# Patient Record
Sex: Female | Born: 1960 | Race: White | Hispanic: No | Marital: Married | Smoking: Never smoker
Health system: Southern US, Community
[De-identification: ages and names within clinical notes are randomized; demographics above are authoritative.]

## PROBLEM LIST (undated history)

## (undated) DIAGNOSIS — I10 Essential (primary) hypertension: Secondary | ICD-10-CM

## (undated) DIAGNOSIS — K760 Fatty (change of) liver, not elsewhere classified: Secondary | ICD-10-CM

## (undated) DIAGNOSIS — G43909 Migraine, unspecified, not intractable, without status migrainosus: Secondary | ICD-10-CM

## (undated) DIAGNOSIS — F4310 Post-traumatic stress disorder, unspecified: Secondary | ICD-10-CM

## (undated) DIAGNOSIS — R1310 Dysphagia, unspecified: Secondary | ICD-10-CM

## (undated) DIAGNOSIS — F419 Anxiety disorder, unspecified: Secondary | ICD-10-CM

## (undated) DIAGNOSIS — K200 Eosinophilic esophagitis: Secondary | ICD-10-CM

## (undated) DIAGNOSIS — F411 Generalized anxiety disorder: Secondary | ICD-10-CM

## (undated) DIAGNOSIS — K219 Gastro-esophageal reflux disease without esophagitis: Secondary | ICD-10-CM

## (undated) DIAGNOSIS — E739 Lactose intolerance, unspecified: Secondary | ICD-10-CM

## (undated) DIAGNOSIS — F32A Depression, unspecified: Secondary | ICD-10-CM

## (undated) DIAGNOSIS — R079 Chest pain, unspecified: Secondary | ICD-10-CM

## (undated) DIAGNOSIS — R739 Hyperglycemia, unspecified: Secondary | ICD-10-CM

## (undated) DIAGNOSIS — G4713 Recurrent hypersomnia: Secondary | ICD-10-CM

## (undated) DIAGNOSIS — T7840XA Allergy, unspecified, initial encounter: Secondary | ICD-10-CM

## (undated) DIAGNOSIS — E559 Vitamin D deficiency, unspecified: Secondary | ICD-10-CM

## (undated) DIAGNOSIS — M2550 Pain in unspecified joint: Secondary | ICD-10-CM

## (undated) DIAGNOSIS — R5383 Other fatigue: Secondary | ICD-10-CM

## (undated) DIAGNOSIS — R0602 Shortness of breath: Secondary | ICD-10-CM

## (undated) DIAGNOSIS — F329 Major depressive disorder, single episode, unspecified: Secondary | ICD-10-CM

## (undated) DIAGNOSIS — E782 Mixed hyperlipidemia: Secondary | ICD-10-CM

## (undated) HISTORY — DX: Hyperglycemia, unspecified: R73.9

## (undated) HISTORY — DX: Dysphagia, unspecified: R13.10

## (undated) HISTORY — DX: Eosinophilic esophagitis: K20.0

## (undated) HISTORY — DX: Migraine, unspecified, not intractable, without status migrainosus: G43.909

## (undated) HISTORY — DX: Generalized anxiety disorder: F41.1

## (undated) HISTORY — DX: Anxiety disorder, unspecified: F41.9

## (undated) HISTORY — DX: Post-traumatic stress disorder, unspecified: F43.10

## (undated) HISTORY — DX: Essential (primary) hypertension: I10

## (undated) HISTORY — DX: Mixed hyperlipidemia: E78.2

## (undated) HISTORY — DX: Recurrent hypersomnia: G47.13

## (undated) HISTORY — DX: Vitamin D deficiency, unspecified: E55.9

## (undated) HISTORY — DX: Pain in unspecified joint: M25.50

## (undated) HISTORY — DX: Chest pain, unspecified: R07.9

## (undated) HISTORY — DX: Fatty (change of) liver, not elsewhere classified: K76.0

## (undated) HISTORY — DX: Other fatigue: R53.83

## (undated) HISTORY — DX: Lactose intolerance, unspecified: E73.9

## (undated) HISTORY — DX: Gastro-esophageal reflux disease without esophagitis: K21.9

## (undated) HISTORY — DX: Allergy, unspecified, initial encounter: T78.40XA

## (undated) HISTORY — DX: Shortness of breath: R06.02

---
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
## 2005-01-13 ENCOUNTER — Other Ambulatory Visit: Admission: RE | Admit: 2005-01-13 | Discharge: 2005-01-13 | Payer: Self-pay | Admitting: Obstetrics and Gynecology

## 2006-07-23 ENCOUNTER — Encounter: Admission: RE | Admit: 2006-07-23 | Discharge: 2006-07-23 | Payer: Self-pay | Admitting: Gastroenterology

## 2008-05-02 ENCOUNTER — Encounter: Admission: RE | Admit: 2008-05-02 | Discharge: 2008-05-02 | Payer: Self-pay | Admitting: Internal Medicine

## 2010-01-01 LAB — HM PAP SMEAR: HM Pap smear: NORMAL

## 2010-01-01 LAB — HM MAMMOGRAPHY: HM Mammogram: NORMAL

## 2010-10-22 ENCOUNTER — Ambulatory Visit (INDEPENDENT_AMBULATORY_CARE_PROVIDER_SITE_OTHER): Payer: BC Managed Care – PPO | Admitting: Internal Medicine

## 2010-10-22 DIAGNOSIS — I10 Essential (primary) hypertension: Secondary | ICD-10-CM

## 2010-12-03 ENCOUNTER — Ambulatory Visit (INDEPENDENT_AMBULATORY_CARE_PROVIDER_SITE_OTHER): Payer: BC Managed Care – PPO | Admitting: Internal Medicine

## 2010-12-03 DIAGNOSIS — G43109 Migraine with aura, not intractable, without status migrainosus: Secondary | ICD-10-CM

## 2010-12-03 DIAGNOSIS — I10 Essential (primary) hypertension: Secondary | ICD-10-CM

## 2010-12-23 ENCOUNTER — Emergency Department (INDEPENDENT_AMBULATORY_CARE_PROVIDER_SITE_OTHER): Payer: Worker's Compensation

## 2010-12-23 ENCOUNTER — Emergency Department (HOSPITAL_BASED_OUTPATIENT_CLINIC_OR_DEPARTMENT_OTHER)
Admission: EM | Admit: 2010-12-23 | Discharge: 2010-12-23 | Disposition: A | Discharge status: Home / Self Care | Payer: Worker's Compensation | Attending: Emergency Medicine | Admitting: Emergency Medicine

## 2010-12-23 DIAGNOSIS — Y9289 Other specified places as the place of occurrence of the external cause: Secondary | ICD-10-CM | POA: Insufficient documentation

## 2010-12-23 DIAGNOSIS — S60229A Contusion of unspecified hand, initial encounter: Secondary | ICD-10-CM | POA: Insufficient documentation

## 2010-12-23 DIAGNOSIS — IMO0002 Reserved for concepts with insufficient information to code with codable children: Secondary | ICD-10-CM | POA: Insufficient documentation

## 2010-12-23 DIAGNOSIS — Y9357 Activity, non-running track and field events: Secondary | ICD-10-CM

## 2010-12-23 DIAGNOSIS — W219XXA Striking against or struck by unspecified sports equipment, initial encounter: Secondary | ICD-10-CM

## 2010-12-23 DIAGNOSIS — I10 Essential (primary) hypertension: Secondary | ICD-10-CM | POA: Insufficient documentation

## 2011-03-06 ENCOUNTER — Encounter: Payer: Self-pay | Admitting: Emergency Medicine

## 2011-04-09 ENCOUNTER — Encounter: Payer: Self-pay | Admitting: Internal Medicine

## 2011-04-09 ENCOUNTER — Ambulatory Visit (HOSPITAL_BASED_OUTPATIENT_CLINIC_OR_DEPARTMENT_OTHER)
Admission: RE | Admit: 2011-04-09 | Discharge: 2011-04-09 | Disposition: A | Discharge status: Home / Self Care | Payer: BC Managed Care – PPO | Source: Ambulatory Visit | Attending: Internal Medicine | Admitting: Internal Medicine

## 2011-04-09 ENCOUNTER — Ambulatory Visit (INDEPENDENT_AMBULATORY_CARE_PROVIDER_SITE_OTHER): Payer: BC Managed Care – PPO | Admitting: Internal Medicine

## 2011-04-09 DIAGNOSIS — I10 Essential (primary) hypertension: Secondary | ICD-10-CM

## 2011-04-09 DIAGNOSIS — R059 Cough, unspecified: Secondary | ICD-10-CM

## 2011-04-09 DIAGNOSIS — R05 Cough: Secondary | ICD-10-CM

## 2011-04-09 DIAGNOSIS — M549 Dorsalgia, unspecified: Secondary | ICD-10-CM | POA: Insufficient documentation

## 2011-04-09 DIAGNOSIS — J45909 Unspecified asthma, uncomplicated: Secondary | ICD-10-CM

## 2011-04-09 DIAGNOSIS — R209 Unspecified disturbances of skin sensation: Secondary | ICD-10-CM

## 2011-04-09 DIAGNOSIS — R208 Other disturbances of skin sensation: Secondary | ICD-10-CM

## 2011-04-09 LAB — COMPREHENSIVE METABOLIC PANEL
CO2: 26 mEq/L (ref 19–32)
Calcium: 9.8 mg/dL (ref 8.4–10.5)
Chloride: 101 mEq/L (ref 96–112)
Creat: 0.73 mg/dL (ref 0.50–1.10)
Glucose, Bld: 88 mg/dL (ref 70–99)
Sodium: 138 mEq/L (ref 135–145)
Total Bilirubin: 0.8 mg/dL (ref 0.3–1.2)
Total Protein: 6.8 g/dL (ref 6.0–8.3)

## 2011-04-09 LAB — CBC WITH DIFFERENTIAL/PLATELET
Basophils Absolute: 0 10*3/uL (ref 0.0–0.1)
HCT: 39 % (ref 36.0–46.0)
Hemoglobin: 13.5 g/dL (ref 12.0–15.0)
Lymphocytes Relative: 25 % (ref 12–46)
MCHC: 34.6 g/dL (ref 30.0–36.0)
Monocytes Absolute: 0.4 10*3/uL (ref 0.1–1.0)
Monocytes Relative: 5 % (ref 3–12)
Neutro Abs: 5.5 10*3/uL (ref 1.7–7.7)
WBC: 8.3 10*3/uL (ref 4.0–10.5)

## 2011-04-09 MED ORDER — PREDNISONE 20 MG PO TABS: ORAL_TABLET | ORAL | Status: DC

## 2011-04-09 MED ORDER — ALBUTEROL SULFATE HFA 108 (90 BASE) MCG/ACT IN AERS: 2.0000 | INHALATION_SPRAY | Freq: Four times a day (QID) | RESPIRATORY_TRACT | Status: DC | PRN

## 2011-04-09 MED ORDER — ALBUTEROL SULFATE (5 MG/ML) 0.5% IN NEBU: 2.5000 mg | INHALATION_SOLUTION | Freq: Once | RESPIRATORY_TRACT | Status: DC

## 2011-04-09 MED ORDER — AZITHROMYCIN 250 MG PO TABS: ORAL_TABLET | ORAL | Status: DC

## 2011-04-09 MED ORDER — ALBUTEROL SULFATE (2.5 MG/3ML) 0.083% IN NEBU
2.5000 mg | INHALATION_SOLUTION | Freq: Once | RESPIRATORY_TRACT | Status: AC
  Administered 2011-04-09: 2.5 mg via RESPIRATORY_TRACT

## 2011-04-09 NOTE — Patient Instructions (Signed)
Take albuterol 2 puffs 3-4 times daily.  Take antibiotic as prescribed  See me in approxiamately 1 week  OTC cough med of choice

## 2011-04-09 NOTE — Progress Notes (Signed)
Subjective:    Patient ID: Megan May, female    DOB: [birth year], 50 y.o.   MRN: [id number]  HPI  Megan May is here for acute visit.  She has had 3 weeks of cough productive at times of greenish mucous.  She has R sided constant pleurtitic pain with some wheezing.  She has had a history of asthma in the past and reports using her albuterol 2 times in the last 3 weeks.  Dayquil seems to control the cough  She also reports she has not been taking the Propranolol I ordered in May.  She reports she was placed on Topamax for migraine prevention and that has helped but she has since stopped her Topamax.  She has no symptoms from her elevated BP  She also reports occasional shooting "electrical" pain near elbow and feet.  She is on Cymbalta and recently changed the dose.  She was worried her glucose may be high.  No visual, speech, changes or muscle weakness.  Allergies  Allergen Reactions  . Penicillins Swelling and Rash   Past Medical History  Diagnosis Date  . Hypertension   . Migraine   . Anxiety   . Eosinophilic esophagitis   . Asthma    Past Surgical History  Procedure Date  . Tonsillectomy and adenoidectomy 1969  . Fracture surgery 1973    R arm  . Cesarean section 2004   History   Social History  . Marital Status: Married    Spouse Name: N/A    Number of Children: N/A  . Years of Education: N/A   Occupational History  . Not on file.   Social History Main Topics  . Smoking status: Never Smoker   . Smokeless tobacco: Not on file  . Alcohol Use: No  . Drug Use: No  . Sexually Active: Yes    Birth Control/ Protection: Surgical   Other Topics Concern  . Not on file   Social History Narrative  . No narrative on file   Family History  Problem Relation Age of Onset  . Hypertension Father   . Heart disease Father   . Diabetes Father    There is no problem list on file for this patient.  Current Outpatient Prescriptions on File Prior to Visit    Medication Sig Dispense Refill  . DULoxetine (CYMBALTA) 30 MG capsule Take 90 mg by mouth daily.        Marland Kitchen eletriptan (RELPAX) 40 MG tablet One tablet by mouth as needed for migraine headache.  If the headache improves and then returns, dose may be repeated after 2 hours have elapsed since first dose (do not exceed 80 mg per day). may repeat in 2 hours if necessary       . propranolol (INDERAL) 20 MG tablet Take 20 mg by mouth 3 (three) times daily.           Review of Systems    See HPI Objective:   Physical Exam Physical Exam  Constitutional: She is oriented to person, place, and time. She appears well-developed and well-nourished. She is cooperative.  HENT:  Head: Normocephalic and atraumatic.  Right Ear: A middle ear effusion is present.  Left Ear: A middle ear effusion is present.  Nose: Mucosal edema present. Right sinus exhibits maxillary sinus tenderness. Left sinus exhibits maxillary sinus tenderness.  Mouth/Throat: Posterior oropharyngeal erythema present.  Serous effusion bilaterally  Eyes: Conjunctivae and EOM are normal. Pupils are equal, round, and reactive to light.  Neck: Neck supple.  Carotid bruit is not present. No mass present.  Cardiovascular: Regular rhythm, normal heart sounds, intact distal pulses and normal pulses. Exam reveals no gallop and no friction rub.  No murmur heard.  Pulmonary/Chest: She has wheezes and rhonchii in the R base.  End expiratory wheezing on L .  Neurological: She is alert and oriented to person, place, and time.  Skin: Skin is warm and dry. No abrasion, no bruising, no ecchymosis and no rash noted. No cyanosis. Nails show no clubbing.  Psychiatric: She has a normal mood and affect. Her speech is normal and behavior is normal.        Assessment & Plan:  1)  Asthma with cough:  Will check CXR and CBC  today .  Clinical exam she may have consolidataion on the Right 2)  HTN:  Pt self discontinued her med.  She is hesitant to take  anything.  I will see her in 1 week and recheck BP at that time. 3)  Dysesthesias elbow and both  feet:  May be Cymbalta but if persists will need neurology referral. 4)  Migraine H/A  improved

## 2011-04-13 ENCOUNTER — Encounter: Payer: Self-pay | Admitting: Emergency Medicine

## 2011-04-16 ENCOUNTER — Ambulatory Visit: Payer: Self-pay | Admitting: Internal Medicine

## 2011-04-22 ENCOUNTER — Ambulatory Visit: Payer: Self-pay | Admitting: Internal Medicine

## 2011-06-29 ENCOUNTER — Encounter: Payer: Self-pay | Admitting: Internal Medicine

## 2011-06-29 ENCOUNTER — Ambulatory Visit (INDEPENDENT_AMBULATORY_CARE_PROVIDER_SITE_OTHER): Payer: BC Managed Care – PPO | Admitting: Internal Medicine

## 2011-06-29 DIAGNOSIS — R760 Raised antibody titer: Secondary | ICD-10-CM | POA: Insufficient documentation

## 2011-06-29 DIAGNOSIS — F419 Anxiety disorder, unspecified: Secondary | ICD-10-CM | POA: Insufficient documentation

## 2011-06-29 DIAGNOSIS — I10 Essential (primary) hypertension: Secondary | ICD-10-CM | POA: Insufficient documentation

## 2011-06-29 DIAGNOSIS — R894 Abnormal immunological findings in specimens from other organs, systems and tissues: Secondary | ICD-10-CM

## 2011-06-29 DIAGNOSIS — G43909 Migraine, unspecified, not intractable, without status migrainosus: Secondary | ICD-10-CM

## 2011-06-29 DIAGNOSIS — K200 Eosinophilic esophagitis: Secondary | ICD-10-CM

## 2011-06-29 DIAGNOSIS — F411 Generalized anxiety disorder: Secondary | ICD-10-CM

## 2011-06-29 NOTE — Progress Notes (Signed)
Subjective:    Patient ID: Megan May, female    DOB: [birth year], 50 y.o.   MRN: [id number]  HPI Megan May is here for an acute visit.  She states she had a horrible headache over the week-end.  She was not sure if it was due to migraine or her BP.  She re-started her topamax over week-end  And has been using RElpax.   2 days ago started her Propranolol 20 mg.  I originally prescribed her Propranolol onm 12/03/2010 and she reports she never took it for reasons that are not clear to me today.  She reports her husband went out to fill the RX and she began taking it on Saturday.  Today headache better. Her outpt. BP's have ranged from 187-205 systolic over the weekend.  No chest pain, SOB, or LE edem  Allergies  Allergen Reactions  . Penicillins Swelling and Rash   Past Medical History  Diagnosis Date  . Hypertension   . Migraine   . Anxiety   . Eosinophilic esophagitis   . Asthma    Past Surgical History  Procedure Date  . Tonsillectomy and adenoidectomy 1969  . Fracture surgery 1973    R arm  . Cesarean section 2004   History   Social History  . Marital Status: Married    Spouse Name: N/A    Number of Children: N/A  . Years of Education: N/A   Occupational History  . Not on file.   Social History Main Topics  . Smoking status: Never Smoker   . Smokeless tobacco: Not on file  . Alcohol Use: No  . Drug Use: No  . Sexually Active: Yes    Birth Control/ Protection: Surgical   Other Topics Concern  . Not on file   Social History Narrative  . No narrative on file   Family History  Problem Relation Age of Onset  . Hypertension Father   . Heart disease Father   . Diabetes Father    Patient Active Problem List  Diagnoses  . Essential hypertension, benign  . Migraines  . Anxiety  . Eosinophilic esophagitis  . Lupus anticoagulant positive   Current Outpatient Prescriptions on File Prior to Visit  Medication Sig Dispense Refill  . albuterol (PROVENTIL  HFA;VENTOLIN HFA) 108 (90 BASE) MCG/ACT inhaler Inhale 2 puffs into the lungs every 6 (six) hours as needed.        Marland Kitchen albuterol (VENTOLIN HFA) 108 (90 BASE) MCG/ACT inhaler Inhale 2 puffs into the lungs every 6 (six) hours as needed for wheezing.  1 Inhaler  0  . DULoxetine (CYMBALTA) 30 MG capsule Take 60 mg by mouth daily.       Marland Kitchen eletriptan (RELPAX) 40 MG tablet One tablet by mouth as needed for migraine headache.  If the headache improves and then returns, dose may be repeated after 2 hours have elapsed since first dose (do not exceed 80 mg per day). may repeat in 2 hours if necessary       . propranolol (INDERAL) 20 MG tablet Take 20 mg by mouth 3 (three) times daily.             Review of Systems    see HPI Objective:   Physical Exam Physical Exam  Nursing note and vitals reviewed.  Constitutional: She is oriented to person, place, and time. She appears well-developed and well-nourished.  HENT:  Head: Normocephalic and atraumatic.  Cardiovascular: Normal rate and regular rhythm. Exam reveals no gallop and no  friction rub.  No murmur heard.  Pulmonary/Chest: Breath sounds normal. She has no wheezes. She has no rales.  Neurological: She is alert and oriented to person, place, and time.  Skin: Skin is warm and dry.  Psychiatric: She has a normal mood and affect. Her behavior is normal. Neuro:  Nonfocal       Assessment & Plan:  1)  Uncontrolled HTN:  I told pt she was playing with fire by not taking her BP med.  She is to take propranolol 20 mg daily and see me in three weeks 2)  Migraine headaches:  Continue to see Dr. Catalina Lunger.  Uncontrolled HTN contributing a signficant part to headaches  See me in 3 weeks.  She neefds CPe

## 2011-06-29 NOTE — Patient Instructions (Signed)
TAke BP med everday  See me in 3 weeks

## 2011-07-15 ENCOUNTER — Ambulatory Visit (INDEPENDENT_AMBULATORY_CARE_PROVIDER_SITE_OTHER): Payer: BC Managed Care – PPO | Admitting: Internal Medicine

## 2011-07-15 ENCOUNTER — Encounter: Payer: Self-pay | Admitting: Internal Medicine

## 2011-07-15 DIAGNOSIS — G43909 Migraine, unspecified, not intractable, without status migrainosus: Secondary | ICD-10-CM

## 2011-07-15 DIAGNOSIS — I10 Essential (primary) hypertension: Secondary | ICD-10-CM

## 2011-07-15 DIAGNOSIS — F419 Anxiety disorder, unspecified: Secondary | ICD-10-CM

## 2011-07-15 DIAGNOSIS — F411 Generalized anxiety disorder: Secondary | ICD-10-CM

## 2011-07-15 NOTE — Progress Notes (Signed)
Subjective:    Patient ID: Megan May, female    DOB: May 05, 1961, 50 y.o.   MRN: [id number]  HPI  Megan May is here for follow up.  She states headaches are less frequent but she does not like to Topamax that Dr. Catalina Lunger prescribes for her.  She has bveen having mood irritability on it.   Tolerating Propranolol well  She does tend to self medicate with her meds.  Allergies  Allergen Reactions  . Penicillins Swelling and Rash   Past Medical History  Diagnosis Date  . Hypertension   . Migraine   . Anxiety   . Eosinophilic esophagitis   . Asthma    Past Surgical History  Procedure Date  . Tonsillectomy and adenoidectomy 1969  . Fracture surgery 1973    R arm  . Cesarean section 2004   History   Social History  . Marital Status: Married    Spouse Name: N/A    Number of Children: N/A  . Years of Education: N/A   Occupational History  . Not on file.   Social History Main Topics  . Smoking status: Never Smoker   . Smokeless tobacco: Not on file  . Alcohol Use: No  . Drug Use: No  . Sexually Active: Yes    Birth Control/ Protection: Surgical   Other Topics Concern  . Not on file   Social History Narrative  . No narrative on file   Family History  Problem Relation Age of Onset  . Hypertension Father   . Heart disease Father   . Diabetes Father    Patient Active Problem List  Diagnoses  . Essential hypertension, benign  . Migraines  . Anxiety  . Eosinophilic esophagitis  . Lupus anticoagulant positive   Current Outpatient Prescriptions on File Prior to Visit  Medication Sig Dispense Refill  . albuterol (PROVENTIL HFA;VENTOLIN HFA) 108 (90 BASE) MCG/ACT inhaler Inhale 2 puffs into the lungs every 6 (six) hours as needed.        Marland Kitchen albuterol (VENTOLIN HFA) 108 (90 BASE) MCG/ACT inhaler Inhale 2 puffs into the lungs every 6 (six) hours as needed for wheezing.  1 Inhaler  0  . DULoxetine (CYMBALTA) 30 MG capsule Take 60 mg by mouth daily.       Marland Kitchen  eletriptan (RELPAX) 40 MG tablet One tablet by mouth as needed for migraine headache.  If the headache improves and then returns, dose may be repeated after 2 hours have elapsed since first dose (do not exceed 80 mg per day). may repeat in 2 hours if necessary       . propranolol (INDERAL) 20 MG tablet Take 20 mg by mouth 3 (three) times daily.        Marland Kitchen topiramate (TOPAMAX) 25 MG capsule Take 50 mg by mouth daily.          Review of Systems    see HPI Objective:   Physical Exam Physical Exam  Nursing note and vitals reviewed.  Constitutional: She is oriented to person, place, and time. She appears well-developed and well-nourished.  HENT:  Head: Normocephalic and atraumatic.  Cardiovascular: Normal rate and regular rhythm. Exam reveals no gallop and no friction rub.  No murmur heard.  Pulmonary/Chest: Breath sounds normal. She has no wheezes. She has no rales.  Neurological: She is alert and oriented to person, place, and time.  Skin: Skin is warm and dry.  Psychiatric: She has a normal mood and affect. Her behavior is normal.  Assessment & Plan:  1)  HTN:  WEll conrtolled on current dose of Propranolol.  I encouraged pt to stay on this dose and take daily 2)  Mood irritability.  To see her psychiatarist 3)

## 2011-07-15 NOTE — Patient Instructions (Signed)
Take BP med daily

## 2011-07-23 ENCOUNTER — Telehealth: Payer: Self-pay | Admitting: Emergency Medicine

## 2011-07-23 DIAGNOSIS — I10 Essential (primary) hypertension: Secondary | ICD-10-CM

## 2011-07-23 NOTE — Telephone Encounter (Signed)
Megan May called with several medication changes and a question related to a requested medication change by her psychiatry team.  She states that Megan May at Dr. Loralie Champagne office is changing her Cymbalta to Lexapro.  However, she would like DDS to change her propanolol to another BP agent because they feel like the Lexapro increases the effect of the propanolol increasing Megan May's fatigue causing her to fall asleep during the day.  They also believe the propanolol is increasing Megan May's depression.  Megan May's migraine physician is also changing her Topamax to Zonegran, so Megan May wanted to be sure that her new BP medication would be compatible with both Lexapro and Zonegran.  Aware DDS OOO until 12/27.  She will leave medications as they are currently and make no changes until DDS back in office and can make a recommendation regarding blood pressure medication.

## 2011-07-31 MED ORDER — DILTIAZEM HCL ER COATED BEADS 300 MG PO CP24: 300.0000 mg | ORAL_CAPSULE | Freq: Every day | ORAL | Status: DC

## 2011-07-31 NOTE — Telephone Encounter (Signed)
Spoke with [employer].  She is aware of how to taper off propanolol and start diltiazem.  She is also aware she needs to make follow up appt in 1 month, but states she will call back when she gets back in town to make appt.  Aware medication escribed to [facility]

## 2011-07-31 NOTE — Telephone Encounter (Signed)
Will change her BP med to cardizem.  Tell her to taper off slowly the propranolol - take a tablet every other day for 1 week then can stop the propranolol.  See Cardizem order  Below and can begin the Cardizem the morning after the last dose of Propranolol. Cardizem should not interfere with other meds or cause depression.  Give her an appt to see me in one month in office. 30 min appt.

## 2011-10-21 ENCOUNTER — Encounter: Payer: Self-pay | Admitting: Internal Medicine

## 2011-10-21 ENCOUNTER — Ambulatory Visit (INDEPENDENT_AMBULATORY_CARE_PROVIDER_SITE_OTHER): Payer: BC Managed Care – PPO | Admitting: Internal Medicine

## 2011-10-21 VITALS — BP 134/80 | HR 64 | Temp 97.9°F | Ht 67.0 in | Wt 196.0 lb

## 2011-10-21 DIAGNOSIS — I10 Essential (primary) hypertension: Secondary | ICD-10-CM

## 2011-10-21 MED ORDER — DILTIAZEM HCL ER COATED BEADS 300 MG PO CP24: 300.0000 mg | ORAL_CAPSULE | Freq: Every day | ORAL | Status: DC

## 2011-10-21 NOTE — Progress Notes (Signed)
  Subjective:    Patient ID: Megan May, female    DOB: [birth year], 51 y.o.   MRN: [id number]  HPI Megan May is here for follow up.  She has run out of her BP med one week ago.   She states  "I hurt all over"  Arms, legs, "everywhere"  She is wondering if she has fibromyalgia  Also decreased hearing in her R ear.    Allergies  Allergen Reactions  . Penicillins Swelling and Rash   Past Medical History  Diagnosis Date  . Hypertension   . Migraine   . Anxiety   . Eosinophilic esophagitis   . Asthma    Past Surgical History  Procedure Date  . Tonsillectomy and adenoidectomy 1969  . Fracture surgery 1973    R arm  . Cesarean section 2004   History   Social History  . Marital Status: Married    Spouse Name: N/A    Number of Children: N/A  . Years of Education: N/A   Occupational History  . Not on file.   Social History Main Topics  . Smoking status: Never Smoker   . Smokeless tobacco: Not on file  . Alcohol Use: No  . Drug Use: No  . Sexually Active: Yes    Birth Control/ Protection: Surgical   Other Topics Concern  . Not on file   Social History Narrative  . No narrative on file   Family History  Problem Relation Age of Onset  . Hypertension Father   . Heart disease Father   . Diabetes Father    Patient Active Problem List  Diagnoses  . Essential hypertension, benign  . Migraines  . Anxiety  . Eosinophilic esophagitis  . Lupus anticoagulant positive   Current Outpatient Prescriptions on File Prior to Visit  Medication Sig Dispense Refill  . albuterol (VENTOLIN HFA) 108 (90 BASE) MCG/ACT inhaler Inhale 2 puffs into the lungs every 6 (six) hours as needed for wheezing.  1 Inhaler  0  . eletriptan (RELPAX) 40 MG tablet One tablet by mouth as needed for migraine headache.  If the headache improves and then returns, dose may be repeated after 2 hours have elapsed since first dose (do not exceed 80 mg per day). may repeat in 2 hours if necessary             Review of Systems See HPI    Objective:   Physical Exam Physical Exam  Nursing note and vitals reviewed.  Constitutional: She is oriented to person, place, and time. She appears well-developed and well-nourished.  HENT:  Head: Normocephalic and atraumatic.  TM's  L canal clear  No effusion  R ear canal large amount of cerumen.   Cardiovascular: Normal rate and regular rhythm. Exam reveals no gallop and no friction rub.  No murmur heard.  Pulmonary/Chest: Breath sounds normal. She has no wheezes. She has no rales.  Neurological: She is alert and oriented to person, place, and time.  Skin: Skin is warm and dry.  Psychiatric: She has a normal mood and affect. Her behavior is normal.         Assessment & Plan:  1)  HTN  Will refill Cardizem 2)  Partial cerumen blockage R ear.  OK for Debrox  She is to notify us if hearing not improved.    Advised pt to schedule CPE  She states  "I avoid them"  Offered Rheumatology referral for suspected FM but she declines at this time

## 2011-10-21 NOTE — Patient Instructions (Signed)
Labs will be mailed to you  Call if not better

## 2011-10-22 ENCOUNTER — Encounter: Payer: Self-pay | Admitting: Emergency Medicine

## 2011-10-22 LAB — COMPREHENSIVE METABOLIC PANEL
Albumin: 4.5 g/dL (ref 3.5–5.2)
Alkaline Phosphatase: 57 U/L (ref 39–117)
BUN: 18 mg/dL (ref 6–23)
CO2: 25 mEq/L (ref 19–32)
Calcium: 9.7 mg/dL (ref 8.4–10.5)
Chloride: 104 mEq/L (ref 96–112)
Creat: 0.9 mg/dL (ref 0.50–1.10)
Glucose, Bld: 81 mg/dL (ref 70–99)
Potassium: 4 mEq/L (ref 3.5–5.3)

## 2011-10-22 LAB — CBC WITH DIFFERENTIAL/PLATELET
Basophils Absolute: 0 10*3/uL (ref 0.0–0.1)
Basophils Relative: 0 % (ref 0–1)
Eosinophils Absolute: 0.2 10*3/uL (ref 0.0–0.7)
Eosinophils Relative: 3 % (ref 0–5)
HCT: 39 % (ref 36.0–46.0)
Hemoglobin: 13.1 g/dL (ref 12.0–15.0)
MCV: 85.3 fL (ref 78.0–100.0)
RBC: 4.57 MIL/uL (ref 3.87–5.11)
WBC: 6.7 10*3/uL (ref 4.0–10.5)

## 2011-10-22 LAB — LIPID PANEL
HDL: 37 mg/dL — ABNORMAL LOW (ref >39)
LDL Cholesterol: 84 mg/dL (ref 0–99)
Total CHOL/HDL Ratio: 3.8 Ratio
VLDL: 19 mg/dL (ref 0–40)

## 2012-06-22 ENCOUNTER — Other Ambulatory Visit: Payer: Self-pay | Admitting: *Deleted

## 2012-06-22 DIAGNOSIS — I10 Essential (primary) hypertension: Secondary | ICD-10-CM

## 2012-06-22 NOTE — Telephone Encounter (Signed)
Needs refill

## 2012-06-23 MED ORDER — DILTIAZEM HCL ER COATED BEADS 300 MG PO CP24: 300.0000 mg | ORAL_CAPSULE | Freq: Every day | ORAL | Status: DC

## 2012-06-27 ENCOUNTER — Other Ambulatory Visit: Payer: Self-pay | Admitting: *Deleted

## 2012-06-27 DIAGNOSIS — I10 Essential (primary) hypertension: Secondary | ICD-10-CM

## 2012-06-27 MED ORDER — ZONISAMIDE 25 MG PO CAPS: 25.0000 mg | ORAL_CAPSULE | Freq: Every day | ORAL | Status: DC

## 2012-06-27 MED ORDER — DILTIAZEM HCL ER COATED BEADS 300 MG PO CP24: 300.0000 mg | ORAL_CAPSULE | Freq: Every day | ORAL | Status: DC

## 2012-06-27 NOTE — Telephone Encounter (Signed)
Confirmed pt pharmacy called rx in to [facility] pt also requesting a refill on zonisamide

## 2012-09-20 ENCOUNTER — Other Ambulatory Visit (INDEPENDENT_AMBULATORY_CARE_PROVIDER_SITE_OTHER): Payer: BC Managed Care – PPO

## 2012-09-20 DIAGNOSIS — R300 Dysuria: Secondary | ICD-10-CM

## 2012-09-20 LAB — POCT URINALYSIS DIPSTICK
Ketones, UA: NEGATIVE
Leukocytes, UA: NEGATIVE
Protein, UA: NEGATIVE
Urobilinogen, UA: NEGATIVE

## 2012-10-13 ENCOUNTER — Other Ambulatory Visit: Payer: Self-pay | Admitting: Internal Medicine

## 2012-10-13 ENCOUNTER — Ambulatory Visit (INDEPENDENT_AMBULATORY_CARE_PROVIDER_SITE_OTHER): Payer: BC Managed Care – PPO | Admitting: Internal Medicine

## 2012-10-13 ENCOUNTER — Encounter: Payer: Self-pay | Admitting: Internal Medicine

## 2012-10-13 VITALS — BP 138/86 | HR 65 | Temp 99.0°F | Resp 18 | Ht 67.0 in | Wt 208.0 lb

## 2012-10-13 DIAGNOSIS — N6310 Unspecified lump in the right breast, unspecified quadrant: Secondary | ICD-10-CM

## 2012-10-13 DIAGNOSIS — N63 Unspecified lump in unspecified breast: Secondary | ICD-10-CM

## 2012-10-13 DIAGNOSIS — N644 Mastodynia: Secondary | ICD-10-CM

## 2012-10-13 NOTE — Progress Notes (Signed)
Subjective:    Patient ID: Megan May, female    DOB: 06-01-1961, 52 y.o.   MRN: [id number]  HPI  Megan May is here for acute visit.  She is concerned about pain and a mass she felt in her R breast.  No FH of breast cancer to her knowledge.  She felt mass in R breast near nipple .  States she cannot feel it today  No nipple discharge    NO redness or rash  Allergies  Allergen Reactions  . Penicillins Swelling and Rash   Past Medical History  Diagnosis Date  . Hypertension   . Migraine   . Anxiety   . Eosinophilic esophagitis   . Asthma    Past Surgical History  Procedure Laterality Date  . Tonsillectomy and adenoidectomy  1969  . Fracture surgery  1973    R arm  . Cesarean section  2004   History   Social History  . Marital Status: Married    Spouse Name: N/A    Number of Children: N/A  . Years of Education: N/A   Occupational History  . Not on file.   Social History Main Topics  . Smoking status: Never Smoker   . Smokeless tobacco: Not on file  . Alcohol Use: No  . Drug Use: No  . Sexually Active: Yes    Birth Control/ Protection: Surgical   Other Topics Concern  . Not on file   Social History Narrative  . No narrative on file   Family History  Problem Relation Age of Onset  . Hypertension Father   . Heart disease Father   . Diabetes Father    Patient Active Problem List  Diagnosis  . Essential hypertension, benign  . Migraines  . Anxiety  . Eosinophilic esophagitis  . Lupus anticoagulant positive  . Mastodynia  . Mass of right breast   Current Outpatient Prescriptions on File Prior to Visit  Medication Sig Dispense Refill  . diltiazem (CARDIZEM CD) 300 MG 24 hr capsule Take 1 capsule (300 mg total) by mouth daily.  90 capsule  3  . eletriptan (RELPAX) 40 MG tablet One tablet by mouth as needed for migraine headache.  If the headache improves and then returns, dose may be repeated after 2 hours have elapsed since first dose (do not  exceed 80 mg per day). may repeat in 2 hours if necessary       . escitalopram (LEXAPRO) 10 MG tablet Take 1 tablet by mouth Daily.      Marland Kitchen zonisamide (ZONEGRAN) 25 MG capsule Take 1 capsule (25 mg total) by mouth daily.  90 capsule  1  . albuterol (VENTOLIN HFA) 108 (90 BASE) MCG/ACT inhaler Inhale 2 puffs into the lungs every 6 (six) hours as needed for wheezing.  1 Inhaler  0  . [DISCONTINUED] DULoxetine (CYMBALTA) 30 MG capsule Take 60 mg by mouth daily.       . [DISCONTINUED] topiramate (TOPAMAX) 25 MG capsule Take 75 mg by mouth daily.        No current facility-administered medications on file prior to visit.       Review of Systems See HPI    Objective:   Physical Exam Physical Exam  Nursing note and vitals reviewed.  Constitutional: She is oriented to person, place, and time. She appears well-developed and well-nourished.  HENT:  Head: Normocephalic and atraumatic.  Cardiovascular: Normal rate and regular rhythm. Exam reveals no gallop and no friction rub.  No murmur heard.  Pulmonary/Chest: Breath sounds normal. She has no wheezes. She has no rales. Breast  L breast no discrete mass no nipple discharge no axillary adenopathy bilaterlaly.  R breast  No nipple discharge. No redness  She has slight thickening 7 oclock near her nipple    Neurological: She is alert and oriented to person, place, and time.  Skin: Skin is warm and dry.  Psychiatric: She has a normal mood and affect. Her behavior is normal.             Assessment & Plan:  Mastodynia  R breast mass will get diagnostic mm

## 2012-10-13 NOTE — Patient Instructions (Addendum)
Call office after mm done

## 2012-10-25 ENCOUNTER — Ambulatory Visit
Admission: RE | Admit: 2012-10-25 | Discharge: 2012-10-25 | Disposition: A | Discharge status: Home / Self Care | Payer: BC Managed Care – PPO | Source: Ambulatory Visit | Attending: Internal Medicine | Admitting: Internal Medicine

## 2012-10-25 DIAGNOSIS — N6310 Unspecified lump in the right breast, unspecified quadrant: Secondary | ICD-10-CM

## 2012-10-27 ENCOUNTER — Telehealth: Payer: Self-pay | Admitting: *Deleted

## 2012-10-27 NOTE — Telephone Encounter (Signed)
No answer will attempt to reach pt again

## 2012-10-27 NOTE — Telephone Encounter (Signed)
Message copied by Mathews Robinsons on Thu Oct 27, 2012  4:33 PM ------      Message from: Raechel Chute D      Created: Thu Oct 27, 2012  8:13 AM       Karen Kitchens            Call pt and let her know that I saw her mammogram and ultrasound results and no mass was detected.            She can see me in office as needed or if any new issue developes when she checks her breasts ------

## 2012-10-28 ENCOUNTER — Telehealth: Payer: Self-pay | Admitting: *Deleted

## 2012-10-28 NOTE — Telephone Encounter (Signed)
Message copied by Mathews Robinsons on Fri Oct 28, 2012 11:07 AM ------      Message from: Raechel Chute D      Created: Thu Oct 27, 2012  8:13 AM       Karen Kitchens            Call pt and let her know that I saw her mammogram and ultrasound results and no mass was detected.            She can see me in office as needed or if any new issue developes when she checks her breasts ------

## 2012-10-28 NOTE — Telephone Encounter (Signed)
LVM

## 2013-04-11 ENCOUNTER — Other Ambulatory Visit: Payer: Self-pay | Admitting: *Deleted

## 2013-04-11 NOTE — Telephone Encounter (Signed)
Notified pharmacy that pt is to get this filled by her neurologist Dr Sharene Skeans

## 2013-04-11 NOTE — Telephone Encounter (Signed)
Refill request

## 2013-04-13 ENCOUNTER — Telehealth: Payer: Self-pay | Admitting: *Deleted

## 2013-04-13 NOTE — Telephone Encounter (Signed)
Pt states that she needs a refill of albuterol and zonisamide. See note in comments section

## 2013-04-14 ENCOUNTER — Telehealth: Payer: Self-pay | Admitting: *Deleted

## 2013-04-14 DIAGNOSIS — R05 Cough: Secondary | ICD-10-CM

## 2013-04-14 DIAGNOSIS — R059 Cough, unspecified: Secondary | ICD-10-CM

## 2013-04-14 DIAGNOSIS — R062 Wheezing: Secondary | ICD-10-CM

## 2013-04-14 MED ORDER — ALBUTEROL SULFATE HFA 108 (90 BASE) MCG/ACT IN AERS: 2.0000 | INHALATION_SPRAY | Freq: Three times a day (TID) | RESPIRATORY_TRACT | Status: DC

## 2013-04-14 MED ORDER — ZONISAMIDE 25 MG PO CAPS: 25.0000 mg | ORAL_CAPSULE | Freq: Every day | ORAL | Status: DC

## 2013-04-14 NOTE — Telephone Encounter (Signed)
Refilled zonisamide per Dr Constance Goltz request

## 2013-04-14 NOTE — Telephone Encounter (Signed)
LVM message to return call  

## 2013-04-16 NOTE — Telephone Encounter (Signed)
Megan May  Call pt ..  I will refill her albuterol but I do not give zonisamide for migraines.  It was given to her by her neurologist but Dr. Catalina Lunger has moved to [facility].   She can see me in office for other options to treat her headaches or I can refer her to a new neurologist.    Let me now  I e-scribed her albuterol.

## 2013-04-18 ENCOUNTER — Telehealth: Payer: Self-pay | Admitting: *Deleted

## 2013-04-18 NOTE — Telephone Encounter (Signed)
Megan May called and left a message that you can leave a detailed message for her on her voicemail.  She is unable to answer phone during normal business hours.

## 2013-04-18 NOTE — Telephone Encounter (Signed)
Notified pt of medication refill

## 2013-04-25 ENCOUNTER — Emergency Department (HOSPITAL_COMMUNITY): Payer: BC Managed Care – PPO

## 2013-04-25 ENCOUNTER — Emergency Department (HOSPITAL_COMMUNITY)
Admission: EM | Admit: 2013-04-25 | Discharge: 2013-04-25 | Disposition: A | Discharge status: Home / Self Care | Payer: BC Managed Care – PPO | Attending: Emergency Medicine | Admitting: Emergency Medicine

## 2013-04-25 ENCOUNTER — Encounter (HOSPITAL_COMMUNITY): Payer: Self-pay

## 2013-04-25 DIAGNOSIS — R4182 Altered mental status, unspecified: Secondary | ICD-10-CM | POA: Insufficient documentation

## 2013-04-25 DIAGNOSIS — E876 Hypokalemia: Secondary | ICD-10-CM

## 2013-04-25 DIAGNOSIS — Z3202 Encounter for pregnancy test, result negative: Secondary | ICD-10-CM | POA: Insufficient documentation

## 2013-04-25 DIAGNOSIS — Z79899 Other long term (current) drug therapy: Secondary | ICD-10-CM | POA: Insufficient documentation

## 2013-04-25 DIAGNOSIS — F411 Generalized anxiety disorder: Secondary | ICD-10-CM | POA: Insufficient documentation

## 2013-04-25 DIAGNOSIS — R209 Unspecified disturbances of skin sensation: Secondary | ICD-10-CM | POA: Insufficient documentation

## 2013-04-25 DIAGNOSIS — Z8719 Personal history of other diseases of the digestive system: Secondary | ICD-10-CM | POA: Insufficient documentation

## 2013-04-25 DIAGNOSIS — Z880 Allergy status to penicillin: Secondary | ICD-10-CM | POA: Insufficient documentation

## 2013-04-25 DIAGNOSIS — J45909 Unspecified asthma, uncomplicated: Secondary | ICD-10-CM | POA: Insufficient documentation

## 2013-04-25 DIAGNOSIS — I10 Essential (primary) hypertension: Secondary | ICD-10-CM | POA: Insufficient documentation

## 2013-04-25 DIAGNOSIS — R269 Unspecified abnormalities of gait and mobility: Secondary | ICD-10-CM | POA: Insufficient documentation

## 2013-04-25 DIAGNOSIS — G43909 Migraine, unspecified, not intractable, without status migrainosus: Secondary | ICD-10-CM | POA: Insufficient documentation

## 2013-04-25 LAB — CBC WITH DIFFERENTIAL/PLATELET
Basophils Absolute: 0 10*3/uL (ref 0.0–0.1)
Basophils Relative: 0 % (ref 0–1)
Eosinophils Relative: 5 % (ref 0–5)
HCT: 40.1 % (ref 36.0–46.0)
Lymphocytes Relative: 26 % (ref 12–46)
Lymphs Abs: 1.7 10*3/uL (ref 0.7–4.0)
MCH: 30.4 pg (ref 26.0–34.0)
MCV: 84.1 fL (ref 78.0–100.0)
Neutro Abs: 4 10*3/uL (ref 1.7–7.7)
Platelets: 228 10*3/uL (ref 150–400)
RBC: 4.77 MIL/uL (ref 3.87–5.11)
RDW: 12.5 % (ref 11.5–15.5)
WBC: 6.4 10*3/uL (ref 4.0–10.5)

## 2013-04-25 LAB — COMPREHENSIVE METABOLIC PANEL
ALT: 26 U/L (ref 0–35)
AST: 25 U/L (ref 0–37)
Alkaline Phosphatase: 76 U/L (ref 39–117)
CO2: 24 mEq/L (ref 19–32)
Calcium: 10 mg/dL (ref 8.4–10.5)
Chloride: 102 mEq/L (ref 96–112)
GFR calc Af Amer: >90 mL/min (ref >90)
GFR calc non Af Amer: 80 mL/min — ABNORMAL LOW (ref >90)
Glucose, Bld: 110 mg/dL — ABNORMAL HIGH (ref 70–99)
Sodium: 138 mEq/L (ref 135–145)
Total Bilirubin: 0.8 mg/dL (ref 0.3–1.2)
Total Protein: 7.4 g/dL (ref 6.0–8.3)

## 2013-04-25 LAB — URINALYSIS W MICROSCOPIC + REFLEX CULTURE
Bilirubin Urine: NEGATIVE
Hgb urine dipstick: NEGATIVE
Ketones, ur: NEGATIVE mg/dL
Nitrite: NEGATIVE
Protein, ur: NEGATIVE mg/dL
Specific Gravity, Urine: 1.006 (ref 1.005–1.030)
Urobilinogen, UA: 1 mg/dL (ref 0.0–1.0)

## 2013-04-25 LAB — POCT I-STAT, CHEM 8
BUN: 12 mg/dL (ref 6–23)
Calcium, Ion: 1.25 mmol/L — ABNORMAL HIGH (ref 1.12–1.23)
Chloride: 106 mEq/L (ref 96–112)
Creatinine, Ser: 0.9 mg/dL (ref 0.50–1.10)
Potassium: 3.4 mEq/L — ABNORMAL LOW (ref 3.5–5.1)
Sodium: 142 mEq/L (ref 135–145)
TCO2: 24 mmol/L (ref 0–100)

## 2013-04-25 LAB — POCT I-STAT TROPONIN I: Troponin i, poc: 0 ng/mL (ref 0.00–0.08)

## 2013-04-25 LAB — GLUCOSE, CAPILLARY

## 2013-04-25 LAB — POCT PREGNANCY, URINE: Preg Test, Ur: NEGATIVE

## 2013-04-25 MED ORDER — BENZTROPINE MESYLATE 1 MG/ML IJ SOLN
1.0000 mg | Freq: Once | INTRAMUSCULAR | Status: AC
  Administered 2013-04-25: 1 mg via INTRAVENOUS
  Filled 2013-04-25: qty 2

## 2013-04-25 MED ORDER — POTASSIUM CHLORIDE CRYS ER 20 MEQ PO TBCR
40.0000 meq | EXTENDED_RELEASE_TABLET | Freq: Once | ORAL | Status: AC
  Administered 2013-04-25: 40 meq via ORAL
  Filled 2013-04-25: qty 2

## 2013-04-25 MED ORDER — SODIUM CHLORIDE 0.9 % IV SOLN
Freq: Once | INTRAVENOUS | Status: AC
  Administered 2013-04-25: 15:00:00 via INTRAVENOUS

## 2013-04-25 NOTE — ED Notes (Signed)
Patient from [employer] via [facility] . About 1100, patient reports being at the vending machine and feeling like her legs "felt funny." patient went to sit down and rest. EMS was called. Patient initially refused transport then changed her mind. Reports lethargy and "heavy eyelids" Per EMS, [facility], negative stroke screen (No arm drift, no slurred speech, no facial droop, no unilateral weakness). Notably, patient started new med for depression about 3 weeks ago. Vital Signs: BP 172/79 HR 53 RR 16 SPO2 99%RA CBG 108 12 lead Sinus Huston Foley

## 2013-04-25 NOTE — ED Provider Notes (Signed)
CSN: [id number]     Arrival date & time 04/25/13  1331 History   First MD Initiated Contact with Patient 04/25/13 1332     Chief Complaint  Patient presents with  . Fatigue  . Weakness   (Consider location/radiation/quality/duration/timing/severity/associated sxs/prior Treatment) HPI Megan May is a 52 y.o. female presents to emergency department with complaint of weakness and not feeling like herself. 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 She states she had difficulty speaking and was unable to walk. Patient states she stood still until a friend brought her chair when she could sit down. Patient states it felt like "something had was all over me." She states after she sat down she felt like she numbness of her left face which lasted only several minutes and resolved. Patient states that since then she has been having intermittent episodes of where she is unable to open her eyes, has difficulty seeing, difficulty moving her head, bilateral arms, bilateral legs. States during these episodes she's unable to purposefully move her extremities. She denies any headache, denies any neck pain, no chest pain, no abdominal pain. No numbness in extremities. States recently has been tapered off of Lexapro and started on the new medication, brintellix, which she states she has been taking for about 2 weeks now, but has had increase in dose 2 days ago. Patient denies any similar prior symptoms. Patient denies any other complaints. Past Medical History  Diagnosis Date  . Hypertension   . Migraine   . Anxiety   . Eosinophilic esophagitis   . Asthma    Past Surgical History  Procedure Laterality Date  . Tonsillectomy and adenoidectomy  1969  . Fracture surgery  1973    R arm  . Cesarean section  2004   Family History  Problem Relation Age of Onset  . Hypertension Father   . Heart disease Father   .  Diabetes Father    History  Substance Use Topics  . Smoking status: Never Smoker   . Smokeless tobacco: Never Used  . Alcohol Use: No   OB History   Grav Para Term Preterm Abortions TAB SAB Ect Mult Living   2 1   1  1         Review of Systems  Constitutional: Negative for fever and chills.  HENT: Negative for neck pain and neck stiffness.   Eyes: Negative for visual disturbance.  Respiratory: Negative for cough, chest tightness and shortness of breath.   Cardiovascular: Negative for chest pain, palpitations and leg swelling.  Gastrointestinal: Negative for nausea, vomiting, abdominal pain and diarrhea.  Genitourinary: Negative for dysuria, flank pain, vaginal bleeding, vaginal discharge, vaginal pain and pelvic pain.  Musculoskeletal: Positive for gait problem. Negative for myalgias and arthralgias.  Skin: Negative for rash.  Neurological: Positive for speech difficulty, weakness and numbness. Negative for dizziness and headaches.  All other systems reviewed and are negative.    Allergies  Penicillins  Home Medications   Current Outpatient Rx  Name  Route  Sig  Dispense  Refill  . albuterol (VENTOLIN HFA) 108 (90 BASE) MCG/ACT inhaler   Inhalation   Inhale 2 puffs into the lungs 3 (three) times daily.   1 Inhaler   0     Prior to exercise   . ALPRAZolam (XANAX) 0.5 MG tablet   Oral   Take 0.5 mg by mouth at bedtime as needed for sleep or anxiety.         Marland Kitchen  diltiazem (TIAZAC) 300 MG 24 hr capsule   Oral   Take 300 mg by mouth daily.         Marland Kitchen escitalopram (LEXAPRO) 10 MG tablet   Oral   Take 5 mg by mouth Daily.          . Vortioxetine HBr (BRINTELLIX) 10 MG TABS   Oral   Take 10 mg by mouth daily.         Marland Kitchen zonisamide (ZONEGRAN) 25 MG capsule   Oral   Take 1 capsule (25 mg total) by mouth daily.   90 capsule   1   . eletriptan (RELPAX) 40 MG tablet   Oral   One tablet by mouth as needed for migraine headache.  If the headache improves and  then returns, dose may be repeated after 2 hours have elapsed since first dose (do not exceed 80 mg per day). may repeat in 2 hours if necessary           BP 180/90  Pulse 52  Temp(Src) 98 F (36.7 C) (Oral)  Resp 16  SpO2 98% Physical Exam  Nursing note and vitals reviewed. Constitutional: She is oriented to person, place, and time. She appears well-developed and well-nourished. No distress.  HENT:  Head: Normocephalic.  Eyes: Conjunctivae and EOM are normal. Pupils are equal, round, and reactive to light.  Neck: Normal range of motion. Neck supple.  Cardiovascular: Normal rate, regular rhythm and normal heart sounds.   Pulmonary/Chest: Effort normal and breath sounds normal. No respiratory distress. She has no wheezes. She has no rales.  Abdominal: Soft. Bowel sounds are normal. She exhibits no distension. There is no tenderness. There is no rebound.  Musculoskeletal: She exhibits no edema.  Neurological: She is alert and oriented to person, place, and time.  During examination she is laying with her eyes closed stating that she has difficulty opening her eyes. She is able to squeeze her eyes. She is able to smile with no facial droop. Cranial nerves are intact other than opening her eyes. Normal sensation over bilateral face, bilateral arms, bilateral legs. Grip strength is equal and 5 out of 5 bilaterally. Patient is having trouble moving her arms and legs and purposeful manner. When she tries to move him she produces twitching movements. If I move her arms or legs in a position she is however able to hold them there and even pushing against resistance. Unable to do finger to nose or heel-to-shin.   Skin: Skin is warm and dry.  Psychiatric:  Flat affect    ED Course  Procedures (including critical care time) Labs Review Labs Reviewed  GLUCOSE, CAPILLARY - Abnormal; Notable for the following:    Glucose-Capillary 105 (*)    All other components within normal limits  CBC WITH  DIFFERENTIAL - Abnormal; Notable for the following:    MCHC 36.2 (*)    All other components within normal limits  COMPREHENSIVE METABOLIC PANEL - Abnormal; Notable for the following:    Potassium 3.3 (*)    Glucose, Bld 110 (*)    GFR calc non Af Amer 80 (*)    All other components within normal limits  POCT I-STAT, CHEM 8 - Abnormal; Notable for the following:    Potassium 3.4 (*)    Glucose, Bld 111 (*)    Calcium, Ion 1.25 (*)    All other components within normal limits  URINALYSIS W MICROSCOPIC + REFLEX CULTURE  POCT I-STAT TROPONIN I  POCT PREGNANCY, URINE  Imaging Review Ct Head Wo Contrast  04/25/2013   CLINICAL DATA:  Fatigue and weakness.  EXAM: CT HEAD WITHOUT CONTRAST  TECHNIQUE: Contiguous axial images were obtained from the base of the skull through the vertex without intravenous contrast.  COMPARISON:  None.  FINDINGS: The brainstem, cerebellum, cerebral peduncles, thalamus, basal ganglia, basilar cisterns, and ventricular system appear within normal limits. No intracranial hemorrhage, mass lesion, or acute CVA.  IMPRESSION: No significant abnormality identified.   Electronically Signed   By: Herbie Baltimore   On: 04/25/2013 14:50    Date: 04/25/2013  Rate: 52  Rhythm: normal sinus rhythm  QRS Axis: normal  Intervals: normal  ST/T Wave abnormalities: nonspecific T wave changes  Conduction Disutrbances:none  Narrative Interpretation:   Old EKG Reviewed: none available   MDM   1. Altered mental status   2. Hypokalemia     Patient was initially seen and examined. Patient with intermittent episodes of heaviness sensation all over the body and difficulty with movement of her arms, legs, neck, speaking, opening her eyes. On my exam patient was unable to have purposeful movement of her arms however once the arms were moved into position she had 5 out of 5 strength against resistance it was able to hold her arms and pushing against mine on exam. Same thing was with his  legs. Lavoie patient was unable to open her eyes but she was talking to me he was alert and oriented. And then a few seconds later patient was able to open her eyes and follow most of my directions. Patient has flat affect and there is some concern for possible depression. She has family at bedside. We'll get labs and CT head. I also ordered her content and. Patient did just have an increase in her dose of Brintellix. Will hydrate.   4:01 PM Patient is back to normal. She has normal neurological exam at this time but normal coordination and strength in all extremities. Will ambulate. I am not concerned about possible stroke given multiple neurological complaints but once. Her CT is negative. Her symptoms did improve after Cogentin. Patient was to be discharged home. She is ambulatory in the hallway. Her labs are unremarkable. Her vital signs are normal other than elevated blood pressure. At this time patient is asymptomatic and stable for discharge home. I think her symptoms most likely related to her medications. I have instructed her to call her doctor and find out if they wanted stop. She is to return her symptoms are worsening or not improving   Filed Vitals:   04/25/13 1430 04/25/13 1530 04/25/13 1600 04/25/13 1618  BP: 158/80 141/68 144/68 129/72  Pulse: 49  49 52  Temp:      TempSrc:      Resp: 24  16 15   SpO2: 100%  99% 99%     Lottie Mussel, PA-C 04/25/13 1955  Myriam Jacobson Umer Harig, PA-C 04/25/13 1959

## 2013-04-25 NOTE — ED Notes (Addendum)
Patient reports around 1145 today she was at the vending machines and felt as if everything was heavy (bilateral arms, legs, head and eyelids). Patient was assisted into a chair. Had an episode of left facial numbness that lasted about 10 minutes. Denies any unilateral weakness, dizziness or visual problems. Endorses some difficulty being able to get words out. Recently was weaned off Lexapro and started on zonisamide (04/14/13). Patient thinks sx may be due to change of medications

## 2013-04-25 NOTE — ED Provider Notes (Signed)
  This was a shared visit with a mid-level provided (NP or PA).  Throughout the patient's course I was available for consultation/collaboration.  I saw the ECG (if appropriate), relevant labs and studies - I agree with the interpretation.  On my exam the patient was in no distress.  She was moving alternate spontaneously, interacting with me appropriately, speaking clearly.  There is some suspicion for psychogenic versus medication effects.  Low suspicion for new seizure activity or infection.  With the patient's significant premature is appropriate for discharge with outpatient followup.      Gerhard Munch, MD 04/25/13 2026

## 2013-04-25 NOTE — ED Notes (Signed)
Patient transported to CT 

## 2013-04-25 NOTE — ED Notes (Signed)
Pt's CBG is 105 mg/dl.RN notified. 

## 2013-05-01 ENCOUNTER — Encounter: Payer: Self-pay | Admitting: Internal Medicine

## 2013-05-01 ENCOUNTER — Ambulatory Visit (INDEPENDENT_AMBULATORY_CARE_PROVIDER_SITE_OTHER): Payer: BC Managed Care – PPO | Admitting: Internal Medicine

## 2013-05-01 VITALS — BP 127/77 | HR 72 | Temp 98.1°F | Resp 18 | Wt 189.0 lb

## 2013-05-01 DIAGNOSIS — R471 Dysarthria and anarthria: Secondary | ICD-10-CM

## 2013-05-01 DIAGNOSIS — G43909 Migraine, unspecified, not intractable, without status migrainosus: Secondary | ICD-10-CM

## 2013-05-01 MED ORDER — DILTIAZEM HCL ER BEADS 300 MG PO CP24: 300.0000 mg | ORAL_CAPSULE | Freq: Every day | ORAL | Status: DC

## 2013-05-01 MED ORDER — DILTIAZEM HCL ER COATED BEADS 300 MG PO CP24: 300.0000 mg | ORAL_CAPSULE | Freq: Every day | ORAL | Status: DC

## 2013-05-01 NOTE — Patient Instructions (Addendum)
Will refer to  neurology    To have carotid ultrasound  TAke Zonegran every other day for 2 weeks then stop  See me in 3-4 week  30 mins or sooner as needed  Ok to take your RElpax as long as you are off your psychiatric medications

## 2013-05-01 NOTE — Progress Notes (Signed)
Subjective:    Patient ID: Megan May, female    DOB: 25-Apr-1961, 52 y.o.   MRN: [id number]  HPI Megan May is here for ER follow up.    See ER note of 04/25/2013.   Episode of dysarthria and ataxia  .  CT negative.   She is off all psychiatric meds now .  She is concerned about possibility of TIA.  She reports a strong FH of CVA  Both maternal grand-parents and maternal aunts.  She reports her daughter had a stroke in utero.    Migraines  She is on Zonisamide low dose daily for migraines.  It has been helping her migraines quite a bit as she could not tolerate  Topamax.  She is wondering if Zonisamide makes her sleepy.    Allergies  Allergen Reactions  . Penicillins Swelling and Rash   Past Medical History  Diagnosis Date  . Hypertension   . Migraine   . Anxiety   . Eosinophilic esophagitis   . Asthma    Past Surgical History  Procedure Laterality Date  . Tonsillectomy and adenoidectomy  1969  . Fracture surgery  1973    R arm  . Cesarean section  2004   History   Social History  . Marital Status: Married    Spouse Name: N/A    Number of Children: N/A  . Years of Education: N/A   Occupational History  . Not on file.   Social History Main Topics  . Smoking status: Never Smoker   . Smokeless tobacco: Never Used  . Alcohol Use: No  . Drug Use: No  . Sexual Activity: Yes    Birth Control/ Protection: Surgical   Other Topics Concern  . Not on file   Social History Narrative  . No narrative on file   Family History  Problem Relation Age of Onset  . Hypertension Father   . Heart disease Father   . Diabetes Father    Patient Active Problem List   Diagnosis Date Noted  . Mastodynia 10/13/2012  . Mass of right breast 10/13/2012  . Essential hypertension, benign 06/29/2011  . Migraines 06/29/2011  . Anxiety 06/29/2011  . Eosinophilic esophagitis 06/29/2011  . Lupus anticoagulant positive 06/29/2011   Current Outpatient Prescriptions on File Prior to  Visit  Medication Sig Dispense Refill  . ALPRAZolam (XANAX) 0.5 MG tablet Take 0.5 mg by mouth at bedtime as needed for sleep or anxiety.      Marland Kitchen eletriptan (RELPAX) 40 MG tablet One tablet by mouth as needed for migraine headache.  If the headache improves and then returns, dose may be repeated after 2 hours have elapsed since first dose (do not exceed 80 mg per day). may repeat in 2 hours if necessary       . zonisamide (ZONEGRAN) 25 MG capsule Take 1 capsule (25 mg total) by mouth daily.  90 capsule  1  . albuterol (VENTOLIN HFA) 108 (90 BASE) MCG/ACT inhaler Inhale 2 puffs into the lungs 3 (three) times daily.  1 Inhaler  0  . escitalopram (LEXAPRO) 10 MG tablet Take 5 mg by mouth Daily.       . Vortioxetine HBr (BRINTELLIX) 10 MG TABS Take 10 mg by mouth daily.      . [DISCONTINUED] DULoxetine (CYMBALTA) 30 MG capsule Take 60 mg by mouth daily.       . [DISCONTINUED] topiramate (TOPAMAX) 25 MG capsule Take 75 mg by mouth daily.  No current facility-administered medications on file prior to visit.       Review of Systems See HPI    Objective:   Physical Exam Physical Exam  Nursing note and vitals reviewed.  Constitutional: She is oriented to person, place, and time. She appears well-developed and well-nourished.  HENT:  Head: Normocephalic and atraumatic.  Neck no carotid bruit Cardiovascular: Normal rate and regular rhythm. Exam reveals no gallop and no friction rub.  No murmur heard.  Pulmonary/Chest: Breath sounds normal. She has no wheezes. She has no rales.  Neurological: She is alert and oriented to person, place, and time.  CNII-Xii intact Refelexes  2+ symmetric Motor  5/5 UE/LE Sensory intact to touch Cerebellar intact FTN Skin: Skin is warm and dry.  Psychiatric: She has a normal mood and affect. Her behavior is normal.             Assessment & Plan:  S/S complex  Of dysarthria/ataxia:    She is very concerned over TIA  Neurologic nonfocal  On exam.   Will get carotid ultrasound.   And refer to neurology.  She is off psychiatric meds now  Migraine headaches.  OK to taper off  Zonisamide qod for 2 weeks then stop.  I counseled  As long as she is off psychiatric meds she can use RElpax for her migraines  Strong FH of CVA/TIA in maternal line  HTN:   Continue current meds  Well controlled  Flu vaccine today  See me in 3-4 weeks or sooner prn

## 2013-05-01 NOTE — Addendum Note (Signed)
Addended by: Raechel Chute D on: 05/01/2013 11:20 AM   Modules accepted: Orders

## 2013-05-02 ENCOUNTER — Ambulatory Visit (HOSPITAL_BASED_OUTPATIENT_CLINIC_OR_DEPARTMENT_OTHER)
Admission: RE | Admit: 2013-05-02 | Discharge: 2013-05-02 | Disposition: A | Discharge status: Home / Self Care | Payer: BC Managed Care – PPO | Source: Ambulatory Visit | Attending: Internal Medicine | Admitting: Internal Medicine

## 2013-05-02 DIAGNOSIS — I6529 Occlusion and stenosis of unspecified carotid artery: Secondary | ICD-10-CM | POA: Insufficient documentation

## 2013-05-02 DIAGNOSIS — R471 Dysarthria and anarthria: Secondary | ICD-10-CM

## 2013-05-03 ENCOUNTER — Telehealth: Payer: Self-pay | Admitting: *Deleted

## 2013-05-03 NOTE — Telephone Encounter (Signed)
Pt notified of [place] results and advised to keep appt with Neurologist

## 2013-05-03 NOTE — Telephone Encounter (Signed)
Unable to leave a message states that Mailbox is full. Will attempt another number

## 2013-05-03 NOTE — Telephone Encounter (Signed)
Message copied by Mathews Robinsons on Wed May 03, 2013  9:20 AM ------      Message from: Raechel Chute D      Created: Wed May 03, 2013  8:11 AM       Karen Kitchens            Call pt and let her know that her carotid ultrasound showed minor blockage in her left carotid that would not cause her symptoms and her R carotid is clear.  She should keep her appt with Dr. Allena Katz the neurologist ------

## 2013-05-08 ENCOUNTER — Ambulatory Visit (INDEPENDENT_AMBULATORY_CARE_PROVIDER_SITE_OTHER): Payer: BC Managed Care – PPO | Admitting: Neurology

## 2013-05-08 ENCOUNTER — Encounter: Payer: Self-pay | Admitting: Neurology

## 2013-05-08 VITALS — BP 150/81 | HR 71 | Resp 16 | Ht 66.5 in | Wt 187.0 lb

## 2013-05-08 DIAGNOSIS — R0989 Other specified symptoms and signs involving the circulatory and respiratory systems: Secondary | ICD-10-CM

## 2013-05-08 DIAGNOSIS — E669 Obesity, unspecified: Secondary | ICD-10-CM

## 2013-05-08 DIAGNOSIS — G4713 Recurrent hypersomnia: Secondary | ICD-10-CM | POA: Insufficient documentation

## 2013-05-08 DIAGNOSIS — R0609 Other forms of dyspnea: Secondary | ICD-10-CM

## 2013-05-08 DIAGNOSIS — R400 Somnolence: Secondary | ICD-10-CM

## 2013-05-08 DIAGNOSIS — R0683 Snoring: Secondary | ICD-10-CM

## 2013-05-08 DIAGNOSIS — R404 Transient alteration of awareness: Secondary | ICD-10-CM

## 2013-05-08 NOTE — Patient Instructions (Signed)
Keep a sleep diary , please.  Please contact Dr. Catalina Lunger, for migraine treatment follow up.

## 2013-05-08 NOTE — Progress Notes (Addendum)
[facility]  Provider:  Melvyn May, M D  Referring Provider: Gerhard Munch, MD Primary Care Physician:  Megan Hedger, MD  Chief Complaint  Patient presents with  . New Evaluation    sleep study ,[place],rm 11    HPI:  Megan May is a 52 y.o. female  Is seen here as a referral/ revisit  from Dr. Nolen May for hypersomnia.  Dr. Nolen May refer to this 52 year old Caucasian, right-handed individual for a sleep consultation based on excessive daytime sleepiness complains. The patient has a history of using aphelia asthma and hyper pressure she endorsed the Epworth sleepiness score in Dr. Loralie May office at 14 points. Dr. Nolen May also stated that the patient had sometimes sinus headaches and migraine headaches.  Her concern is that organic sleep disorder may accentuate or course of the excessive daytime sleepiness.  Since this referral was sent to our office on 04-10-2013,  the patient has experienced a spell that may have been representing a TIA. The spell occurred on 04/25/2013. 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 The patient states that she felt as if her upper body became happy and tilted over, she then lost muscle tone in all extremities and torso. Her eyes were closed she felt a heavy. She also remarked that she never lost awareness of her surroundings or consciousness, she was able to hear everything that was spoken but could not adequately respond. The spell lasted four hours. It resolved without traces. She felt transiently numb on the  left face , right hand.   The patient will to bed around 8 PM and rises at 5:30 AM. She estimates her average sleep time was such a night to be a 5-6 hours. It would take her 3-4 hours to initiate sleep. In that she likes to read in bed but does not watch TV and is not exposed to bright green light. Her bedroom is dark, call and quiet. She shares  the bed with her 34 year old daughter, Megan May. Since June of this year the patient endorsed that she lost 21 pounds of body weight. This is weight loss she has less need for a daytime nap she feels. Before she used to nap when she caught, and this nap would last 20 minutes, but she  limited to one a day.  Felt not Refreshed after Nap. She mentioned her sleep habits have changed t since the recent incident , and she has an easier time falling asleep, She is out about  1 hour after going to sleep, gets now 7 hours or more. She has at least 3-4 bathroom breaks at night. No incontinence in daytime.  The patient drinks caffeinated beverages in the morning 2 cups of coffee or tea in the morning, caffeinated sodas.  She endorses the Epworth sleepiness score is 11 points and her fatigue severity score at 45 points, with this elevated fatigue comes a higher level of depression.  She was on Lexapro, and was just in the process  of switching to bronchtilex  and her spell occurred. Her husband has remarked that she snoring, but did not comment on any gasping for air or any witnessed apneas. Since her daughter is not sharing a bedroom with her, she has not been able to get her  daughter  To confirm these observations. She likes to sleep longer on weekends, up to 8 am.   Is no history of trauma or surgical alteration of  the upper airway , facial skull or upper neck.  Her migraines are treated by Dr. Catalina May , in [place],  [place].  She has not tolerated TOPIRAMATE , causing word finding difficulties. She has no morning headaches.     Review of Systems: Out of a complete 14 system review, the patient complains of only the following symptoms, and all other reviewed systems are negative. Word finding issues, fatigue , dizziness, feeling  heavy and sluggish.  Epworth 11, tearful ,FSS 42, unhappy., overwhelmed. She states that besides sleepiness she suffers from memory loss, headaches, weakness, depression, anxiety. The  TIA episode occurred on  The day after she started on Bruntallex.     History   Social History  . Marital Status: Married    Spouse Name: Megan May    Number of Children: 1  . Years of Education: 2 Masters   Occupational History  . SPECIAL EDUCATOR     Administrator   Social History Main Topics  . Smoking status: Never Smoker   . Smokeless tobacco: Never Used  . Alcohol Use: No  . Drug Use: No  . Sexual Activity: Yes    Birth Control/ Protection: Surgical   Other Topics Concern  . Not on file   Social History Narrative   Patient is right handed, resides in home with husband.She consumes 16 oz of tea daily.    Family History  Problem Relation Age of Onset  . Hypertension Father   . Heart disease Father   . Diabetes Father     Past Medical History  Diagnosis Date  . Hypertension   . Migraine   . Anxiety   . Eosinophilic esophagitis   . Asthma   . Posttraumatic stress disorder   . Generalized anxiety disorder   . Migraines   . Hypersomnia, recurrent     Past Surgical History  Procedure Laterality Date  . Tonsillectomy and adenoidectomy  1969  . Fracture surgery  1973    R arm  . Cesarean section  2004    Current Outpatient Prescriptions  Medication Sig Dispense Refill  . albuterol (PROVENTIL HFA;VENTOLIN HFA) 108 (90 BASE) MCG/ACT inhaler Inhale 2 puffs into the lungs 3 (three) times daily. As needed      . ALPRAZolam (XANAX) 0.5 MG tablet Take 0.5 mg by mouth at bedtime as needed for sleep or anxiety.      Marland Kitchen diltiazem (CARTIA XT) 300 MG 24 hr capsule Take 1 capsule (300 mg total) by mouth daily.  90 capsule  3  . eletriptan (RELPAX) 40 MG tablet One tablet by mouth as needed for migraine headache.  If the headache improves and then returns, dose may be repeated after 2 hours have elapsed since first dose (do not exceed 80 mg per day). may repeat in 2 hours if necessary       . zonisamide (ZONEGRAN) 25 MG capsule Take 1 capsule (25 mg total) by mouth  daily.  90 capsule  1  . [DISCONTINUED] DULoxetine (CYMBALTA) 30 MG capsule Take 60 mg by mouth daily.       . [DISCONTINUED] topiramate (TOPAMAX) 25 MG capsule Take 75 mg by mouth daily.        No current facility-administered medications for this visit.    Allergies as of 05/08/2013 - Review Complete 05/08/2013  Allergen Reaction Noted  . Penicillins Swelling and Rash 03/06/2011    Vitals: BP 150/81  Pulse 71  Resp 16  Ht 5' 6.5" (1.689 m)  Wt 187  lb (84.823 kg)  BMI 29.73 kg/m2  LMP 04/11/2013 Last Weight:  Wt Readings from Last 1 Encounters:  05/08/13 187 lb (84.823 kg)   Last Height:   Ht Readings from Last 1 Encounters:  05/08/13 5' 6.5" (1.689 m)    Physical exam:  General: The patient is awake, alert and appears not in acute distress. The patient is well groomed. Head: Normocephalic, atraumatic. Neck is supple. Mallampati 3 , neck circumference: 14 inches no nasal deviation.  Cardiovascular:  Regular rate and rhythm , without  murmurs or carotid bruit, and without distended neck veins. Respiratory: Lungs are clear to auscultation. Skin:  Without evidence of edema, or rash Trunk: BMI is  Elevated. This  patient  has normal posture.  Neurologic exam : The patient is awake and alert, oriented to place and time.  Memory subjective  described as limited  There is a reduced  attention span & concentration ability.  Speech is fluent without  dysarthria, dysphonia or aphasia. Mood and affect are  Depressed, the speech is monotonous and slowed, there is certainly a depression component.   Cranial nerves: Pupils are equal and briskly reactive to light. Funduscopic exam without  evidence of pallor or edema. Extraocular movements  in vertical and horizontal planes intact and without nystagmus.  Visual fields by finger perimetry are intact. Hearing to finger rub intact.  Facial sensation intact to fine touch. Facial motor strength is symmetric and tongue and uvula move  midline.  Motor exam:   Normal tone and normal muscle bulk and symmetric normal strength in all extremities.  Sensory:  Fine touch, pinprick and vibration were tested in all extremities. Proprioception is  normal.  Coordination: Rapid alternating movements in the fingers/hands is tested and normal. Finger-to-nose maneuver without evidence of ataxia, dysmetria or tremor.  Gait and station: Patient walks without assistive device - Strength within normal limits. Stance is stable and normal. Tandem gait unfragmented. Romberg testing is normal.  Deep tendon reflexes: in the  upper and lower extremities are symmetric and intact. Babinski maneuver response is downgoing.     Assessment:  After physical and neurologic examination, review of laboratory studies, imaging, neurophysiology testing and pre-existing records,  assessment is that of a patient with psychological and depression related hypersomnia, mostly fatigue issues.  "TIA "symptoms for four hours without focality is unlikely organic in origin.   Plan:  Treatment plan and additional workup : OSA evaluation , snoring reported, fatigue and moderate hypersomnia noted.  Sleep study ordered. epworth 14 in office 4 weeks ago, now 11 points. Patient just  begun sleeping better over the last 2 weeks - Unclear what let to these changes- the  weaning of Celexa? Starting [facility]- Dr Constance Goltz has evaluated her with carotid dopplers, CT head normal .

## 2013-05-11 ENCOUNTER — Telehealth: Payer: Self-pay | Admitting: *Deleted

## 2013-05-11 NOTE — Telephone Encounter (Signed)
Returned pt call LVM message to return call

## 2013-05-12 ENCOUNTER — Encounter: Payer: Self-pay | Admitting: Neurology

## 2013-05-12 ENCOUNTER — Ambulatory Visit (INDEPENDENT_AMBULATORY_CARE_PROVIDER_SITE_OTHER): Payer: BC Managed Care – PPO | Admitting: Neurology

## 2013-05-12 VITALS — BP 146/88 | HR 60 | Temp 98.1°F | Resp 14 | Wt 188.0 lb

## 2013-05-12 DIAGNOSIS — R4789 Other speech disturbances: Secondary | ICD-10-CM

## 2013-05-12 DIAGNOSIS — M6281 Muscle weakness (generalized): Secondary | ICD-10-CM

## 2013-05-12 DIAGNOSIS — R42 Dizziness and giddiness: Secondary | ICD-10-CM

## 2013-05-12 DIAGNOSIS — F809 Developmental disorder of speech and language, unspecified: Secondary | ICD-10-CM

## 2013-05-12 NOTE — Progress Notes (Signed)
[facility] Clinic Note - Initial Visit   Date: 05/12/2013   Megan May MRN: [id number] DOB: [birth year]   Dear Dr Constance Goltz:  Thank you for your kind referral of Megan May for consultation of spells of weakness. Although her history is well known to you, please allow [place] to reiterate it for the purpose of our medical record. The patient was accompanied to the clinic by self.   History of Present Illness: Megan May is a 52 y.o. year-old right-handed Caucasian female with history of HTN, migraines, anxiety, eosinophilic esophagitis, MTHFR homozygous, and asthma presenting for evaluation of spells of weakness.   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  She felt weak as if her legs were heavy, lost her muscle tone, and had difficulty keeping her eyes open, but she was fully conscious and aware of everything.  Her colleagues helped her to sit on a chair, but she remained week, stating that even her head felt too heavy to hold, so it was propped on boxes.  She could hear everything and knew what was going on, but felt extremely fatigued.  This was followed by left facial numbness and right hand tingling, which lasted about 10 minutes.  She would gradually be able to open her eyes and would feel exhausted again.  Her colleagues thought she was having a stroke, so called EMS.  These fluctuating symptoms lasted about 4 hours.  She went to the hospital for these symptoms where she was given Cogentin, because of suspected medication effect.  She was in her third week of titrating a new anti-depressant, Brintelex, being tapered off Lexapro.  She eventually improved and was discharged home.  She has since been taken off Brintellex, Lexapro, and zonisamide (stopping tomorrow).  Over the past few weeks, she had reported word-finding difficulties and dizziness, and but this is  improving.  She had never had similar spells before.  Denies any cramping, myalgias, or episodic weakness.  She was not fasting or eating high-carbohydrate meals..  Currently, she is doing well and has no complaints.  Out-side paper records, electronic medical record, and images have been reviewed where available and summarized as:  BMP normal, except potassium 3.1 US carotids 05/03/2012: 1. Trace noncalcified atherosclerotic plaque in the left carotid bifurcation resulting in less than 50% diameter stenosis.  2. Unremarkable right carotid artery.  3. Bilateral vertebral arteries are patent with normal antegrade flow.  CT head wo contrast 04/25/2013: No significant abnormality identified.   Past Medical History  Diagnosis Date  . Hypertension   . Migraine   . Anxiety   . Eosinophilic esophagitis   . Asthma   . Posttraumatic stress disorder   . Generalized anxiety disorder   . Migraines   . Hypersomnia, recurrent     Past Surgical History  Procedure Laterality Date  . Tonsillectomy and adenoidectomy  1969  . Fracture surgery  1973    R arm  . Cesarean section  2004     Medications:  Current Outpatient Prescriptions on File Prior to Visit  Medication Sig Dispense Refill  . albuterol (PROVENTIL HFA;VENTOLIN HFA) 108 (90 BASE) MCG/ACT inhaler Inhale 2 puffs into the lungs 3 (three) times daily. As needed      . ALPRAZolam (XANAX) 0.5 MG tablet Take 0.5 mg by mouth at bedtime as needed for sleep or anxiety.      Marland Kitchen diltiazem (CARTIA XT) 300 MG 24  hr capsule Take 1 capsule (300 mg total) by mouth daily.  90 capsule  3  . eletriptan (RELPAX) 40 MG tablet One tablet by mouth as needed for migraine headache.  If the headache improves and then returns, dose may be repeated after 2 hours have elapsed since first dose (do not exceed 80 mg per day). may repeat in 2 hours if necessary       . zonisamide (ZONEGRAN) 25 MG capsule Take 1 capsule (25 mg total) by mouth daily.  90 capsule  1  .  [DISCONTINUED] DULoxetine (CYMBALTA) 30 MG capsule Take 60 mg by mouth daily.       . [DISCONTINUED] topiramate (TOPAMAX) 25 MG capsule Take 75 mg by mouth daily.        No current facility-administered medications on file prior to visit.    Allergies:  Allergies  Allergen Reactions  . Penicillins Swelling and Rash    Family History: Family History  Problem Relation Age of Onset  . Hypertension Father   . Heart disease Father   . Diabetes Father     Social History: History   Social History  . Marital Status: Married    Spouse Name: Megan May    Number of Children: 1  . Years of Education: 2 Masters   Occupational History  . SPECIAL EDUCATOR     Administrator   Social History Main Topics  . Smoking status: Never Smoker   . Smokeless tobacco: Never Used  . Alcohol Use: No  . Drug Use: No  . Sexual Activity: Yes    Birth Control/ Protection: Surgical   Other Topics Concern  . Not on file   Social History Narrative   Patient is right handed, resides in home with husband.She consumes 16 oz of tea daily.    Review of Systems:  CONSTITUTIONAL: No fevers, chills, night sweats, + 22 lb weight loss since June EYES: No visual changes or eye pain ENT: No hearing changes.  No history of nose bleeds.   RESPIRATORY: No cough, wheezing and shortness of breath.   CARDIOVASCULAR: Negative for chest pain, and palpitations.   GI: Negative for abdominal discomfort, blood in stools or black stools.  No recent change in bowel habits.   GU:  No history of incontinence.   MUSCLOSKELETAL: No history of joint pain or swelling.  No myalgias.   SKIN: Negative for lesions, rash, and itching.   HEMATOLOGY/ONCOLOGY: Negative for prolonged bleeding, bruising easily, and swollen nodes.  ENDOCRINE: Negative for cold or heat intolerance, polydipsia or goiter.   PSYCH:  +depression or anxiety symptoms.   NEURO: As Above.   Vital Signs:  BP 146/88  Pulse 60  Temp(Src) 98.1 F (36.7 C)   Resp 14  Wt 188 lb (85.276 kg)  BMI 29.89 kg/m2  LMP 04/11/2013    Neurological Exam: MENTAL STATUS including orientation to time, place, person, recent and remote memory, attention span and concentration, language, and fund of knowledge is normal.  Speech is not dysarthric.  CRANIAL NERVES: II:  No visual field defects.  Unremarkable fundi.   III-IV-VI: Pupils equal round and reactive to light.  Normal conjugate, extra-ocular eye movements in all directions of gaze.  No nystagmus.  Subtle left ptosis.   V:  Normal facial sensation.   VII:  Normal facial symmetry and movements.  VIII:  Normal hearing and vestibular function.   IX-X:  Normal palatal movement.   XI:  Normal shoulder shrug and head rotation.   XII:  Normal tongue strength and range of motion, no deviation or fasciculation.  MOTOR:  No atrophy, fasciculations or abnormal movements.  No pronator drift.  Tone is normal.    Right Upper Extremity:    Left Upper Extremity:    Deltoid  5/5   Deltoid  5/5   Biceps  5/5   Biceps  5/5   Triceps  5/5   Triceps  5/5   Wrist extensors  5/5   Wrist extensors  5/5   Wrist flexors  5/5   Wrist flexors  5/5   Finger extensors  5/5   Finger extensors  5/5   Finger flexors  5/5   Finger flexors  5/5   Dorsal interossei  5/5   Dorsal interossei  5/5   Abductor pollicis  5/5   Abductor pollicis  5/5   Tone (Ashworth scale)  0  Tone (Ashworth scale)  0   Right Lower Extremity:    Left Lower Extremity:    Hip flexors  5/5   Hip flexors  5/5   Hip extensors  5/5   Hip extensors  5/5   Knee flexors  5/5   Knee flexors  5/5   Knee extensors  5/5   Knee extensors  5/5   Dorsiflexors  5/5   Dorsiflexors  5/5   Plantarflexors  5/5   Plantarflexors  5/5   Toe extensors  5/5   Toe extensors  5/5   Toe flexors  5/5   Toe flexors  5/5   Tone (Ashworth scale)  0  Tone (Ashworth scale)  0   MSRs:  Right                                                                 Left brachioradialis 2+   brachioradialis 2+  biceps 2+  biceps 2+  triceps 2+  triceps 2+  patellar 2+  patellar 2+  ankle jerk 2+  ankle jerk 2+  Hoffman no  Hoffman no  plantar response down  plantar response down   SENSORY:  Normal and symmetric perception of light touch, pinprick, vibration, and proprioception.  Romberg's sign positive.   COORDINATION/GAIT: Normal finger-to- nose-finger and heel-to-shin.  Intact rapid alternating movements bilaterally.  Able to rise from a chair without using arms.  Gait narrow based and stable. Tandem and stressed gait intact.    IMPRESSION: [name] is a 52 year-old female presenting with episodic weakness, word-finding difficulty, and dizziness.  Her neurological examination is notable for subtle left ptosis and positive Rhomberg, but otherwise unremarkable. Her previous work-up has included CT brain and US carotids which is unrevealing.  I would like to obtain MRI/A brain to further evaluate, but my suspicion for intracranial stenosis or pathology is low.  If work-up returns normal, I suspect that her symptoms are related to medication effects.   PLAN/RECOMMENDATIONS:  1.  MRI brain wo contrast 2.  MRA head wo contrast 3.  Telephone update with results  The duration of this appointment visit was 60 minutes of face-to-face time with the patient.  Greater than 50% of this time was spent in counseling, explanation of diagnosis, planning of further management, and coordination of care.   Thank you for allowing me to participate in patient's care.  If I can answer any additional questions, I would be pleased to do so.    Sincerely,    Wylder Macomber K. Posey Pronto, DO

## 2013-05-12 NOTE — Patient Instructions (Signed)
You have been scheduled for an MRI at [facility] on Monday October 20th at 8:00am.  Please arrive 15 minutes prior to your appointment and register at radiology on the first floor entrance A. [phone]

## 2013-05-22 ENCOUNTER — Ambulatory Visit (HOSPITAL_COMMUNITY): Admission: RE | Admit: 2013-05-22 | Payer: BC Managed Care – PPO | Source: Ambulatory Visit

## 2013-05-22 ENCOUNTER — Ambulatory Visit (HOSPITAL_COMMUNITY)
Admission: RE | Admit: 2013-05-22 | Discharge: 2013-05-22 | Disposition: A | Discharge status: Home / Self Care | Payer: BC Managed Care – PPO | Source: Ambulatory Visit | Attending: Neurology | Admitting: Neurology

## 2013-05-22 DIAGNOSIS — M6281 Muscle weakness (generalized): Secondary | ICD-10-CM

## 2013-05-22 DIAGNOSIS — R42 Dizziness and giddiness: Secondary | ICD-10-CM | POA: Insufficient documentation

## 2013-05-22 DIAGNOSIS — R5383 Other fatigue: Secondary | ICD-10-CM | POA: Insufficient documentation

## 2013-05-22 DIAGNOSIS — R5381 Other malaise: Secondary | ICD-10-CM | POA: Insufficient documentation

## 2013-05-22 DIAGNOSIS — R4789 Other speech disturbances: Secondary | ICD-10-CM | POA: Insufficient documentation

## 2013-05-23 ENCOUNTER — Telehealth: Payer: Self-pay

## 2013-05-23 NOTE — Telephone Encounter (Signed)
Called pt and relayed your message. 

## 2013-05-24 ENCOUNTER — Ambulatory Visit (INDEPENDENT_AMBULATORY_CARE_PROVIDER_SITE_OTHER): Payer: BC Managed Care – PPO | Admitting: Internal Medicine

## 2013-05-24 ENCOUNTER — Encounter: Payer: Self-pay | Admitting: Internal Medicine

## 2013-05-24 VITALS — BP 134/86 | HR 58 | Temp 98.0°F | Resp 18 | Wt 189.0 lb

## 2013-05-24 DIAGNOSIS — F411 Generalized anxiety disorder: Secondary | ICD-10-CM

## 2013-05-24 DIAGNOSIS — F419 Anxiety disorder, unspecified: Secondary | ICD-10-CM

## 2013-05-24 DIAGNOSIS — I10 Essential (primary) hypertension: Secondary | ICD-10-CM

## 2013-05-24 NOTE — Patient Instructions (Signed)
See me as needed 

## 2013-05-24 NOTE — Progress Notes (Signed)
Subjective:    Patient ID: Megan May, female    DOB: [birth year], 52 y.o.   MRN: [id number]  HPI  Megan May is here for follow up  She is off many of her psychiatric meds and feels better  She only uses Xanax occasionally now  .   She states the influenza vaccine  May the whole left side of her body sore and numb  Allergies  Allergen Reactions  . Penicillins Swelling and Rash   Past Medical History  Diagnosis Date  . Hypertension   . Migraine   . Anxiety   . Eosinophilic esophagitis   . Asthma   . Posttraumatic stress disorder   . Generalized anxiety disorder   . Migraines   . Hypersomnia, recurrent    Past Surgical History  Procedure Laterality Date  . Tonsillectomy and adenoidectomy  1969  . Fracture surgery  1973    R arm  . Cesarean section  2004   History   Social History  . Marital Status: Married    Spouse Name: Minerva Areola    Number of Children: 1  . Years of Education: 2 Masters   Occupational History  . SPECIAL EDUCATOR     Administrator   Social History Main Topics  . Smoking status: Never Smoker   . Smokeless tobacco: Never Used  . Alcohol Use: No  . Drug Use: No  . Sexual Activity: Yes    Birth Control/ Protection: Surgical   Other Topics Concern  . Not on file   Social History Narrative   Patient is right handed, resides in home with husband and daughter. She consumes 16 oz of tea daily.   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   Family History  Problem Relation Age of Onset  . Hypertension Father   . Heart disease Father   . Diabetes Father   . Stroke Maternal Grandfather   . Stroke Maternal Grandmother    Patient Active Problem List   Diagnosis Date Noted  . Hypersomnia, recurrent   . Dysarthria 05/01/2013  . Mastodynia 10/13/2012  . Mass of right breast 10/13/2012  . Essential hypertension, benign 06/29/2011  . Migraines 06/29/2011  . Anxiety 06/29/2011  . Eosinophilic esophagitis 06/29/2011  . Lupus  anticoagulant positive 06/29/2011   Current Outpatient Prescriptions on File Prior to Visit  Medication Sig Dispense Refill  . albuterol (PROVENTIL HFA;VENTOLIN HFA) 108 (90 BASE) MCG/ACT inhaler Inhale 2 puffs into the lungs 3 (three) times daily. As needed      . diltiazem (CARTIA XT) 300 MG 24 hr capsule Take 1 capsule (300 mg total) by mouth daily.  90 capsule  3  . eletriptan (RELPAX) 40 MG tablet One tablet by mouth as needed for migraine headache.  If the headache improves and then returns, dose may be repeated after 2 hours have elapsed since first dose (do not exceed 80 mg per day). may repeat in 2 hours if necessary       . ALPRAZolam (XANAX) 0.5 MG tablet Take 0.5 mg by mouth 2 (two) times daily.       Marland Kitchen zonisamide (ZONEGRAN) 25 MG capsule Take 1 capsule (25 mg total) by mouth daily.  90 capsule  1  . [DISCONTINUED] DULoxetine (CYMBALTA) 30 MG capsule Take 60 mg by mouth daily.       . [DISCONTINUED] topiramate (TOPAMAX) 25 MG capsule Take 75 mg by mouth daily.        No current facility-administered medications on  file prior to visit.      Review of Systems    see HPI Objective:   Physical Exam  Physical Exam  Nursing note and vitals reviewed.  Constitutional: She is oriented to person, place, and time. She appears well-developed and well-nourished.  HENT:  Head: Normocephalic and atraumatic.  Cardiovascular: Normal rate and regular rhythm. Exam reveals no gallop and no friction rub.  No murmur heard.  Pulmonary/Chest: Breath sounds normal. She has no wheezes. She has no rales.  Neurological: She is alert and oriented to person, place, and time.  Skin: Skin is warm and dry.  Psychiatric: She has a normal mood and affect. Her behavior is normal.       Assessment & Plan:  htn  Continue meds   Anxiety  Occasional xanax by psychiatrist   Advised talking therapy

## 2013-05-28 ENCOUNTER — Ambulatory Visit (INDEPENDENT_AMBULATORY_CARE_PROVIDER_SITE_OTHER): Payer: BC Managed Care – PPO

## 2013-05-28 DIAGNOSIS — R0609 Other forms of dyspnea: Secondary | ICD-10-CM

## 2013-05-28 DIAGNOSIS — R0683 Snoring: Secondary | ICD-10-CM

## 2013-05-28 DIAGNOSIS — R400 Somnolence: Secondary | ICD-10-CM

## 2013-05-28 DIAGNOSIS — E669 Obesity, unspecified: Secondary | ICD-10-CM

## 2013-05-28 DIAGNOSIS — R0989 Other specified symptoms and signs involving the circulatory and respiratory systems: Secondary | ICD-10-CM

## 2013-06-12 ENCOUNTER — Telehealth: Payer: Self-pay | Admitting: Neurology

## 2013-06-12 NOTE — Telephone Encounter (Signed)
I called and left a message for the patient that her sleep study revealed no sleep apnea, but snoring and treatment form options are dental devices or ENT procedure. I will mail a copy to the patient and fax a copy to Dr. Loralie Champagne office.

## 2013-06-14 ENCOUNTER — Encounter: Payer: Self-pay | Admitting: *Deleted

## 2013-11-20 ENCOUNTER — Encounter: Payer: Self-pay | Admitting: Internal Medicine

## 2013-11-20 ENCOUNTER — Ambulatory Visit (INDEPENDENT_AMBULATORY_CARE_PROVIDER_SITE_OTHER): Payer: BC Managed Care – PPO | Admitting: Internal Medicine

## 2013-11-20 VITALS — BP 166/88 | HR 63 | Temp 98.0°F | Resp 16 | Wt 187.0 lb

## 2013-11-20 DIAGNOSIS — Z1151 Encounter for screening for human papillomavirus (HPV): Secondary | ICD-10-CM

## 2013-11-20 DIAGNOSIS — R102 Pelvic and perineal pain: Secondary | ICD-10-CM

## 2013-11-20 DIAGNOSIS — N949 Unspecified condition associated with female genital organs and menstrual cycle: Secondary | ICD-10-CM

## 2013-11-20 DIAGNOSIS — Z124 Encounter for screening for malignant neoplasm of cervix: Secondary | ICD-10-CM

## 2013-11-20 DIAGNOSIS — N390 Urinary tract infection, site not specified: Secondary | ICD-10-CM

## 2013-11-20 DIAGNOSIS — R319 Hematuria, unspecified: Secondary | ICD-10-CM

## 2013-11-20 LAB — POCT URINALYSIS DIPSTICK
Bilirubin, UA: NEGATIVE
Glucose, UA: NEGATIVE
Ketones, UA: NEGATIVE
Nitrite, UA: NEGATIVE
PH UA: 6
Protein, UA: NEGATIVE
SPEC GRAV UA: 1.015
UROBILINOGEN UA: NEGATIVE

## 2013-11-20 MED ORDER — CIPROFLOXACIN HCL 250 MG PO TABS: 250.0000 mg | ORAL_TABLET | Freq: Two times a day (BID) | ORAL | Status: DC

## 2013-11-20 NOTE — Patient Instructions (Signed)
See me mon or Tuesday next week

## 2013-11-20 NOTE — Progress Notes (Signed)
Subjective:    Patient ID: Megan May, female    DOB: February 19, 1961, 53 y.o.   MRN: [id number]  HPI  Megan May is here for acute vsit  Several days of dysuria and vaginal pain  J"Feels like my insides are coming out"   No vaginal discharge   Pt not sexually active  No fever    LMP March 18th.  She is due for menses now  Allergies  Allergen Reactions  . Penicillins Swelling and Rash   Past Medical History  Diagnosis Date  . Hypertension   . Migraine   . Anxiety   . Eosinophilic esophagitis   . Asthma   . Posttraumatic stress disorder   . Generalized anxiety disorder   . Migraines   . Hypersomnia, recurrent    Past Surgical History  Procedure Laterality Date  . Tonsillectomy and adenoidectomy  1969  . Fracture surgery  1973    R arm  . Cesarean section  2004   History   Social History  . Marital Status: Married    Spouse Name: Randall Hiss    Number of Children: 1  . Years of Education: 2 Masters   Occupational History  . SPECIAL EDUCATOR     Teacher, early years/pre   Social History Main Topics  . Smoking status: Never Smoker   . Smokeless tobacco: Never Used  . Alcohol Use: No  . Drug Use: No  . Sexual Activity: Yes    Birth Control/ Protection: Surgical   Other Topics Concern  . Not on file   Social History Narrative   Patient is right handed, resides in home with husband and daughter. She consumes 16 oz of tea daily.   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   Family History  Problem Relation Age of Onset  . Hypertension Father   . Heart disease Father   . Diabetes Father   . Stroke Maternal Grandfather   . Stroke Maternal Grandmother    Patient Active Problem List   Diagnosis Date Noted  . Hypersomnia, recurrent   . Dysarthria 05/01/2013  . Mastodynia 10/13/2012  . Mass of right breast 10/13/2012  . Essential hypertension, benign 06/29/2011  . Migraines 06/29/2011  . Anxiety 06/29/2011  . Eosinophilic esophagitis 73/71/0626  . Lupus  anticoagulant positive 06/29/2011   Current Outpatient Prescriptions on File Prior to Visit  Medication Sig Dispense Refill  . albuterol (PROVENTIL HFA;VENTOLIN HFA) 108 (90 BASE) MCG/ACT inhaler Inhale 2 puffs into the lungs 3 (three) times daily. As needed      . diltiazem (CARTIA XT) 300 MG 24 hr capsule Take 1 capsule (300 mg total) by mouth daily.  90 capsule  3  . ALPRAZolam (XANAX) 0.5 MG tablet Take 0.5 mg by mouth 2 (two) times daily.       Marland Kitchen eletriptan (RELPAX) 40 MG tablet One tablet by mouth as needed for migraine headache.  If the headache improves and then returns, dose may be repeated after 2 hours have elapsed since first dose (do not exceed 80 mg per day). may repeat in 2 hours if necessary       . zonisamide (ZONEGRAN) 25 MG capsule Take 1 capsule (25 mg total) by mouth daily.  90 capsule  1  . [DISCONTINUED] DULoxetine (CYMBALTA) 30 MG capsule Take 60 mg by mouth daily.       . [DISCONTINUED] topiramate (TOPAMAX) 25 MG capsule Take 75 mg by mouth daily.        No  current facility-administered medications on file prior to visit.       Review of Systems    see HPI Objective:   Physical Exam  Physical Exam  Nursing note and vitals reviewed.  Constitutional: She is oriented to person, place, and time. She appears well-developed and well-nourished.  HENT:  Head: Normocephalic and atraumatic.  Cardiovascular: Normal rate and regular rhythm. Exam reveals no gallop and no friction rub.  No murmur heard.  Pulmonary/Chest: Breath sounds normal. She has no wheezes. She has no rales.  Neurological: She is alert and oriented to person, place, and time.  Pelvic  Normal external genitalia   Vagina no lesions Cervix  No lesions pap,  GC/Chlamydia , wet prep and KOH obtian No adnexal masses or tendernesss Skin: Skin is warm and dry.  Psychiatric: She has a normal mood and affect. Her behavior is normal.         Assessment & Plan:  UTI  U/a pos LE blood will gev 3 days  course of cipro  See me next week  Vaginal pain  Pap and STD worrk up in progress  No adnexal tenderness  See me next week

## 2013-11-22 LAB — URINE CULTURE

## 2013-11-22 LAB — GC/CHLAMYDIA PROBE AMP
CT PROBE, AMP APTIMA: NEGATIVE
GC PROBE AMP APTIMA: NEGATIVE

## 2013-11-22 LAB — WET PREP, GENITAL
Clue Cells Wet Prep HPF POC: NONE SEEN
Trich, Wet Prep: NONE SEEN
WBC WET PREP: NONE SEEN
Yeast Wet Prep HPF POC: NONE SEEN

## 2013-11-23 ENCOUNTER — Telehealth: Payer: Self-pay | Admitting: *Deleted

## 2013-11-23 LAB — KOH PREP: RESULT - KOH: NONE SEEN

## 2013-11-23 NOTE — Telephone Encounter (Signed)
Message copied by Conley Rolls on Thu Nov 23, 2013  8:32 AM ------      Message from: Emi Belfast D      Created: Wed Nov 22, 2013  8:20 AM       Jolayne Haines            Call pt and let her know that all STD tests are negative.  OK to mail results to her with her permisson ------

## 2013-11-23 NOTE — Telephone Encounter (Signed)
Pt states that she is feeling better notified her of results. Pt declined a copy

## 2013-11-27 ENCOUNTER — Encounter: Payer: Self-pay | Admitting: Internal Medicine

## 2013-11-27 ENCOUNTER — Ambulatory Visit (INDEPENDENT_AMBULATORY_CARE_PROVIDER_SITE_OTHER): Payer: BC Managed Care – PPO | Admitting: Internal Medicine

## 2013-11-27 VITALS — BP 179/86 | HR 74 | Temp 98.1°F | Resp 18 | Wt 189.0 lb

## 2013-11-27 DIAGNOSIS — R319 Hematuria, unspecified: Secondary | ICD-10-CM

## 2013-11-27 DIAGNOSIS — G43909 Migraine, unspecified, not intractable, without status migrainosus: Secondary | ICD-10-CM

## 2013-11-27 DIAGNOSIS — F419 Anxiety disorder, unspecified: Secondary | ICD-10-CM

## 2013-11-27 DIAGNOSIS — I10 Essential (primary) hypertension: Secondary | ICD-10-CM

## 2013-11-27 DIAGNOSIS — F411 Generalized anxiety disorder: Secondary | ICD-10-CM

## 2013-11-27 LAB — CBC WITH DIFFERENTIAL/PLATELET
BASOS ABS: 0.1 10*3/uL (ref 0.0–0.1)
BASOS PCT: 1 % (ref 0–1)
Eosinophils Absolute: 0.4 10*3/uL (ref 0.0–0.7)
Eosinophils Relative: 5 % (ref 0–5)
HEMATOCRIT: 42.2 % (ref 36.0–46.0)
Hemoglobin: 14.6 g/dL (ref 12.0–15.0)
Lymphocytes Relative: 27 % (ref 12–46)
Lymphs Abs: 2.2 10*3/uL (ref 0.7–4.0)
MCH: 28.8 pg (ref 26.0–34.0)
MCHC: 34.6 g/dL (ref 30.0–36.0)
MCV: 83.2 fL (ref 78.0–100.0)
MONO ABS: 0.3 10*3/uL (ref 0.1–1.0)
Monocytes Relative: 4 % (ref 3–12)
NEUTROS ABS: 5.2 10*3/uL (ref 1.7–7.7)
NEUTROS PCT: 63 % (ref 43–77)
PLATELETS: 254 10*3/uL (ref 150–400)
RBC: 5.07 MIL/uL (ref 3.87–5.11)
RDW: 14 % (ref 11.5–15.5)
WBC: 8.2 10*3/uL (ref 4.0–10.5)

## 2013-11-27 LAB — POCT URINALYSIS DIPSTICK
Bilirubin, UA: NEGATIVE
Glucose, UA: NEGATIVE
Ketones, UA: NEGATIVE
LEUKOCYTES UA: NEGATIVE
Nitrite, UA: NEGATIVE
PROTEIN UA: NEGATIVE
Spec Grav, UA: <=1.005
UROBILINOGEN UA: NEGATIVE
pH, UA: 7

## 2013-11-27 LAB — LIPID PANEL
Cholesterol: 214 mg/dL — ABNORMAL HIGH (ref 0–200)
HDL: 61 mg/dL (ref >39)
LDL CALC: 125 mg/dL — AB (ref 0–99)
Total CHOL/HDL Ratio: 3.5 Ratio
Triglycerides: 140 mg/dL (ref <150)
VLDL: 28 mg/dL (ref 0–40)

## 2013-11-27 LAB — COMPREHENSIVE METABOLIC PANEL
ALBUMIN: 4.4 g/dL (ref 3.5–5.2)
ALT: 14 U/L (ref 0–35)
AST: 18 U/L (ref 0–37)
Alkaline Phosphatase: 70 U/L (ref 39–117)
BUN: 12 mg/dL (ref 6–23)
CO2: 25 mEq/L (ref 19–32)
Calcium: 9.7 mg/dL (ref 8.4–10.5)
Chloride: 103 mEq/L (ref 96–112)
Creat: 0.7 mg/dL (ref 0.50–1.10)
Glucose, Bld: 79 mg/dL (ref 70–99)
POTASSIUM: 4.1 meq/L (ref 3.5–5.3)
Sodium: 139 mEq/L (ref 135–145)
Total Bilirubin: 1.2 mg/dL (ref 0.2–1.2)
Total Protein: 6.8 g/dL (ref 6.0–8.3)

## 2013-11-27 MED ORDER — ELETRIPTAN HYDROBROMIDE 40 MG PO TABS: ORAL_TABLET | ORAL | Status: DC

## 2013-11-27 MED ORDER — DILTIAZEM HCL ER BEADS 360 MG PO CP24: ORAL_CAPSULE | ORAL | Status: DC

## 2013-11-27 NOTE — Patient Instructions (Addendum)
Schedule  CPE  Pt to call Dr. Sima Matas her neurologist for appoinment   To xray to schedule  Pelvic ultrasound  See me in 3 weeks  30 mins  Take blood pressure medication every day

## 2013-11-27 NOTE — Progress Notes (Signed)
Subjective:    Patient ID: Megan May, female    DOB: 12-05-1960, 53 y.o.   MRN: [id number]  HPI  Megan May is here for follow up   She states that she has not pelvic pain but still feels pressure.  She has been told by her GYN Dr. Philis Pique that she had a dermoid cyst years ago.    She has no vaginal discharge  See labs  Pap neg ,  Urine CX only 20,000  Colonies.      See BP  She has not taken any of her cardizem in several weeks.  She has a headache off and on  She would like a refill of her RElpax that helps her.  She tell me she is not on the Cymbalta now  She has not seen her neurologist Dr. Sima Matas in quite some time  .  NO change in her migraine headaches.    Allergies  Allergen Reactions  . Penicillins Swelling and Rash   Past Medical History  Diagnosis Date  . Hypertension   . Migraine   . Anxiety   . Eosinophilic esophagitis   . Asthma   . Posttraumatic stress disorder   . Generalized anxiety disorder   . Migraines   . Hypersomnia, recurrent    Past Surgical History  Procedure Laterality Date  . Tonsillectomy and adenoidectomy  1969  . Fracture surgery  1973    R arm  . Cesarean section  2004   History   Social History  . Marital Status: Married    Spouse Name: Randall Hiss    Number of Children: 1  . Years of Education: 2 Masters   Occupational History  . SPECIAL EDUCATOR     Teacher, early years/pre   Social History Main Topics  . Smoking status: Never Smoker   . Smokeless tobacco: Never Used  . Alcohol Use: No  . Drug Use: No  . Sexual Activity: Yes    Birth Control/ Protection: Surgical   Other Topics Concern  . Not on file   Social History Narrative   Patient is right handed, resides in home with husband and daughter. She consumes 16 oz of tea daily.   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   Family History  Problem Relation Age of Onset  . Hypertension Father   . Heart disease Father   . Diabetes Father   . Stroke Maternal  Grandfather   . Stroke Maternal Grandmother    Patient Active Problem List   Diagnosis Date Noted  . Hypersomnia, recurrent   . Dysarthria 05/01/2013  . Mastodynia 10/13/2012  . Mass of right breast 10/13/2012  . Essential hypertension, benign 06/29/2011  . Migraines 06/29/2011  . Anxiety 06/29/2011  . Eosinophilic esophagitis 21/19/4174  . Lupus anticoagulant positive 06/29/2011   Current Outpatient Prescriptions on File Prior to Visit  Medication Sig Dispense Refill  . albuterol (PROVENTIL HFA;VENTOLIN HFA) 108 (90 BASE) MCG/ACT inhaler Inhale 2 puffs into the lungs 3 (three) times daily. As needed      . ALPRAZolam (XANAX) 0.5 MG tablet Take 0.5 mg by mouth 2 (two) times daily.       . ciprofloxacin (CIPRO) 250 MG tablet Take 1 tablet (250 mg total) by mouth 2 (two) times daily.  6 tablet  0  . [DISCONTINUED] DULoxetine (CYMBALTA) 30 MG capsule Take 60 mg by mouth daily.       . [DISCONTINUED] topiramate (TOPAMAX) 25 MG capsule Take 75 mg by mouth  daily.        No current facility-administered medications on file prior to visit.      Review of Systems See HPi    Objective:   Physical Exam Physical Exam  Nursing note and vitals reviewed.   Repeat BP  160/88 Constitutional: She is oriented to person, place, and time. She appears well-developed and well-nourished.  HENT:  Head: Normocephalic and atraumatic.  Cardiovascular: Normal rate and regular rhythm. Exam reveals no gallop and no friction rub.  No murmur heard.  Pulmonary/Chest: Breath sounds normal. She has no wheezes. She has no rales.  Neurological: She is alert and oriented to person, place, and time.  Skin: Skin is warm and dry.  Psychiatric: She has a normal mood and affect. Her behavior is normal.        Assessment & Plan:  Pelvic pressure  History of dermoid cyst:  Will schedule  Pelvic and TVUS    Further management based on results  HTN  Pt has been advised mutlliple time to take her BP Pill  Daily.   I have re-ordered her Cardizem  360 mg ot be taken daily  Migtraine headaches  Will give only 10 tablets of RElpax.  ADvised she should not take if she is using Cymbalta or other anti-depreessants.  She is to make appt with Dr. Sima Matas    See me in 2-3 weeks

## 2013-11-28 LAB — VITAMIN D 25 HYDROXY (VIT D DEFICIENCY, FRACTURES): Vit D, 25-Hydroxy: 21 ng/mL — ABNORMAL LOW (ref 30–89)

## 2013-11-28 LAB — TSH: TSH: 2.582 u[IU]/mL (ref 0.350–4.500)

## 2013-11-29 ENCOUNTER — Encounter: Payer: Self-pay | Admitting: *Deleted

## 2013-11-29 LAB — URINALYSIS, MICROSCOPIC ONLY
Casts: NONE SEEN
Crystals: NONE SEEN

## 2013-11-30 ENCOUNTER — Ambulatory Visit (HOSPITAL_BASED_OUTPATIENT_CLINIC_OR_DEPARTMENT_OTHER)
Admission: RE | Admit: 2013-11-30 | Discharge: 2013-11-30 | Disposition: A | Discharge status: Home / Self Care | Payer: BC Managed Care – PPO | Source: Ambulatory Visit | Attending: Internal Medicine | Admitting: Internal Medicine

## 2013-11-30 ENCOUNTER — Telehealth: Payer: Self-pay | Admitting: Internal Medicine

## 2013-11-30 DIAGNOSIS — D279 Benign neoplasm of unspecified ovary: Secondary | ICD-10-CM | POA: Insufficient documentation

## 2013-11-30 DIAGNOSIS — N83209 Unspecified ovarian cyst, unspecified side: Secondary | ICD-10-CM

## 2013-11-30 DIAGNOSIS — R319 Hematuria, unspecified: Secondary | ICD-10-CM

## 2013-11-30 NOTE — Telephone Encounter (Signed)
Spoke with pt and infomed of Ultrassound results.  She has what appers to be a 6.5 cm dermoid csyst on R ovary  Will refer to GYN  She does ot wish to see Dr. Philis Pique

## 2013-12-07 ENCOUNTER — Telehealth: Payer: Self-pay | Admitting: *Deleted

## 2013-12-07 NOTE — Telephone Encounter (Signed)
Attempted to call [facility] switchboard turned off at 430 will call again in the am

## 2013-12-13 ENCOUNTER — Ambulatory Visit (INDEPENDENT_AMBULATORY_CARE_PROVIDER_SITE_OTHER): Payer: BC Managed Care – PPO | Admitting: Internal Medicine

## 2013-12-13 ENCOUNTER — Encounter: Payer: Self-pay | Admitting: Internal Medicine

## 2013-12-13 VITALS — BP 159/77 | HR 68 | Temp 98.1°F | Resp 16 | Wt 190.0 lb

## 2013-12-13 DIAGNOSIS — J029 Acute pharyngitis, unspecified: Secondary | ICD-10-CM

## 2013-12-13 DIAGNOSIS — I10 Essential (primary) hypertension: Secondary | ICD-10-CM

## 2013-12-13 DIAGNOSIS — K200 Eosinophilic esophagitis: Secondary | ICD-10-CM

## 2013-12-13 DIAGNOSIS — R319 Hematuria, unspecified: Secondary | ICD-10-CM

## 2013-12-13 DIAGNOSIS — R1032 Left lower quadrant pain: Secondary | ICD-10-CM

## 2013-12-13 MED ORDER — AZITHROMYCIN 250 MG PO TABS: ORAL_TABLET | ORAL | Status: DC

## 2013-12-13 NOTE — Progress Notes (Signed)
Subjective:    Patient ID: Megan May, female    DOB: June 28, 1961, 53 y.o.   MRN: [id number]  HPI Megan May is here for follow up of several issues  She has  Right sided ovarian mass suggestive of dermoid cyst and has been evaluated by GYN who recommended surgery in future.    I do not have GYN note as yet  I wanted to follow up on pts.  Hematuria and her pain . Pt   is on her menses today. Her culture on 4/20 was non revealing for bacterial UTI  HTN:  Pt  stops taking her BP meds but tells me she takes it "sometimes".  She has had sore throat for last several days no fever  Some nasal congestion  Megan May is tearful in office and states " I feel awful all the time"    She tells me she that her GYN MD also wants to refer her for a GI evaluation as she has had pain on her left side  (side opposite of her R ovarian mass) and that she has has eosinophilic esophagitis has seen 2 GI MD's  Dr. Oletta May and someone in [place] whose name she cannot recall.    Pain does come and go in LLQ and she thinks it is associated with meals at times  Allergies  Allergen Reactions  . Penicillins Swelling and Rash   Past Medical History  Diagnosis Date  . Hypertension   . Migraine   . Anxiety   . Eosinophilic esophagitis   . Asthma   . Posttraumatic stress disorder   . Generalized anxiety disorder   . Migraines   . Hypersomnia, recurrent    Past Surgical History  Procedure Laterality Date  . Tonsillectomy and adenoidectomy  1969  . Fracture surgery  1973    R arm  . Cesarean section  2004   History   Social History  . Marital Status: Married    Spouse Name: Megan May    Number of Children: 1  . Years of Education: 2 Masters   Occupational History  . SPECIAL EDUCATOR     Teacher, early years/pre   Social History Main Topics  . Smoking status: Never Smoker   . Smokeless tobacco: Never Used  . Alcohol Use: No  . Drug Use: No  . Sexual Activity: Yes    Birth Control/ Protection: Surgical     Other Topics Concern  . Not on file   Social History Narrative   Patient is right handed, resides in home with husband and daughter. She consumes 16 oz of tea daily.   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   Family History  Problem Relation Age of Onset  . Hypertension Father   . Heart disease Father   . Diabetes Father   . Stroke Maternal Grandfather   . Stroke Maternal Grandmother    Patient Active Problem List   Diagnosis Date Noted  . Hypersomnia, recurrent   . Dysarthria 05/01/2013  . Mastodynia 10/13/2012  . Mass of right breast 10/13/2012  . Essential hypertension, benign 06/29/2011  . Migraines 06/29/2011  . Anxiety 06/29/2011  . Eosinophilic esophagitis 84/69/6295  . Lupus anticoagulant positive 06/29/2011   Current Outpatient Prescriptions on File Prior to Visit  Medication Sig Dispense Refill  . albuterol (PROVENTIL HFA;VENTOLIN HFA) 108 (90 BASE) MCG/ACT inhaler Inhale 2 puffs into the lungs 3 (three) times daily. As needed      . ALPRAZolam (XANAX) 0.5 MG tablet  Take 0.5 mg by mouth 2 (two) times daily.       Marland Kitchen diltiazem (TIAZAC) 360 MG 24 hr capsule Take one daily  30 capsule  3  . eletriptan (RELPAX) 40 MG tablet Take one at onset of hadache.   may repeat in 2 hours one time only if necessary  10 tablet  0  . [DISCONTINUED] DULoxetine (CYMBALTA) 30 MG capsule Take 60 mg by mouth daily.       . [DISCONTINUED] topiramate (TOPAMAX) 25 MG capsule Take 75 mg by mouth daily.        No current facility-administered medications on file prior to visit.       Review of Systems See HPI    Objective:   Physical Exam Physical Exam  Constitutional: She is oriented to person, place, and time. She appears well-developed and well-nourished. She is cooperative.  HENT:  Head: Normocephalic and atraumatic.  Right Ear: A middle ear effusion is present.  Left Ear: A middle ear effusion is present.  Nose: Mucosal edema present.  Mouth/Throat: Oropharyngeal  exudate and posterior oropharyngeal erythema present.  Serous effusion bilaterally  Eyes: Conjunctivae and EOM are normal. Pupils are equal, round, and reactive to light.  Neck: Neck supple. Carotid bruit is not present. No mass present.  Cardiovascular: Regular rhythm, normal heart sounds, intact distal pulses and normal pulses. Exam reveals no gallop and no friction rub.  No murmur heard.  Pulmonary/Chest: Breath sounds normal. She has no wheezes. She has no rhonchi. She has no rales.  Lymphadenopathy:  She has cervical adenopathy.  ABD I did not perform abd exam today Neurological: She is alert and oriented to person, place, and time.  Skin: Skin is warm and dry. No abrasion, no bruising, no ecchymosis and no rash noted. No cyanosis. Nails show no clubbing.  Psychiatric: She has a normal mood and affect. Her speech is normal and behavior is normal.        Assessment & Plan:  Pharyngitis  Will give Z-pak    Hematuria  Will get repeat U/A when off menses  Pelvic and LLQ pain/ Demoid cyst:  Etiology unclear.  - nor worsening of chronic symptoms.   Will need notes from GYN.   I did encourage pt to follow advise from the GYN MD and go for GI Evaluatation when they set this up  .  I am not sure pt will follow through with this.    She is tearful and hesitant as she has been told she has eosinophilic esophagitis in the past.    HTN  Advised to take her meds daily   Eosinophilic esophagitis  AddenduM  5/18:  Review of GYN note:  Pain reproduced on her LLQ side on pelvic exam.  No pain on RIght side during GYN exam.  GYN office is referring to GI for further eval.

## 2014-01-09 ENCOUNTER — Other Ambulatory Visit: Payer: Self-pay | Admitting: Gastroenterology

## 2014-01-09 DIAGNOSIS — R1013 Epigastric pain: Secondary | ICD-10-CM

## 2014-01-18 ENCOUNTER — Encounter: Payer: Self-pay | Admitting: Internal Medicine

## 2014-01-18 ENCOUNTER — Ambulatory Visit (INDEPENDENT_AMBULATORY_CARE_PROVIDER_SITE_OTHER): Payer: BC Managed Care – PPO | Admitting: Internal Medicine

## 2014-01-18 VITALS — BP 136/80 | HR 72 | Resp 16 | Ht 66.5 in | Wt 184.0 lb

## 2014-01-18 DIAGNOSIS — K200 Eosinophilic esophagitis: Secondary | ICD-10-CM

## 2014-01-18 DIAGNOSIS — R109 Unspecified abdominal pain: Secondary | ICD-10-CM

## 2014-01-18 DIAGNOSIS — R319 Hematuria, unspecified: Secondary | ICD-10-CM

## 2014-01-18 LAB — POCT URINALYSIS DIPSTICK
Bilirubin, UA: NEGATIVE
GLUCOSE UA: NEGATIVE
Ketones, UA: NEGATIVE
Leukocytes, UA: NEGATIVE
NITRITE UA: NEGATIVE
PH UA: 7
Protein, UA: NEGATIVE
SPEC GRAV UA: 1.01
Urobilinogen, UA: 0.2

## 2014-01-18 NOTE — Patient Instructions (Signed)
Stop by xray

## 2014-01-18 NOTE — Progress Notes (Signed)
Subjective:    Patient ID: Megan May, female    DOB: 1960/08/11, 53 y.o.   MRN: [id number]  HPI Megan May is here for follow up of her abd pain (approx 4 months) and her hematuria.    She is out of school now.    She has been evaluated by both GYN and GI.  GYN did not feel left sided abd pain was GYN related.  GI MD ordered Flovent for her eosinophilic esophagitis and Dexilant - pt has not taken either as yet as she is awaiting prior authorization. She also will be undergoing a HIDA scan and colonsocopy in the future.     See scanned notes.  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  She denies Diarrhea, no dysuria or urgency.   No fever at home  Allergies  Allergen Reactions  . Penicillins Swelling and Rash   Past Medical History  Diagnosis Date  . Hypertension   . Migraine   . Anxiety   . Eosinophilic esophagitis   . Asthma   . Posttraumatic stress disorder   . Generalized anxiety disorder   . Migraines   . Hypersomnia, recurrent    Past Surgical History  Procedure Laterality Date  . Tonsillectomy and adenoidectomy  1969  . Fracture surgery  1973    R arm  . Cesarean section  2004   History   Social History  . Marital Status: Married    Spouse Name: Randall Hiss    Number of Children: 1  . Years of Education: 2 Masters   Occupational History  . SPECIAL EDUCATOR     Teacher, early years/pre   Social History Main Topics  . Smoking status: Never Smoker   . Smokeless tobacco: Never Used  . Alcohol Use: No  . Drug Use: No  . Sexual Activity: Yes    Birth Control/ Protection: Surgical   Other Topics Concern  . Not on file   Social History Narrative   Patient is right handed, resides in home with husband and daughter. She consumes 16 oz of tea daily.   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   Family History  Problem Relation Age of Onset  . Hypertension Father    . Heart disease Father   . Diabetes Father   . Stroke Maternal Grandfather   . Stroke Maternal Grandmother    Patient Active Problem List   Diagnosis Date Noted  . Hypersomnia, recurrent   . Dysarthria 05/01/2013  . Mastodynia 10/13/2012  . Mass of right breast 10/13/2012  . Essential hypertension, benign 06/29/2011  . Migraines 06/29/2011  . Anxiety 06/29/2011  . Eosinophilic esophagitis 31/54/0086  . Lupus anticoagulant positive 06/29/2011   Current Outpatient Prescriptions on File Prior to Visit  Medication Sig Dispense Refill  . albuterol (PROVENTIL HFA;VENTOLIN HFA) 108 (90 BASE) MCG/ACT inhaler Inhale 2 puffs into the lungs 3 (three) times daily. As needed      . ALPRAZolam (XANAX) 0.5 MG tablet Take 0.5 mg by mouth 2 (two) times daily.       Marland Kitchen diltiazem (TIAZAC) 360 MG 24 hr capsule Take one daily  30 capsule  3  . eletriptan (RELPAX) 40 MG tablet Take one at onset of hadache.   may repeat in 2 hours one time only if necessary  10 tablet  0  . [DISCONTINUED] DULoxetine (CYMBALTA) 30 MG capsule  Take 60 mg by mouth daily.       . [DISCONTINUED] topiramate (TOPAMAX) 25 MG capsule Take 75 mg by mouth daily.        No current facility-administered medications on file prior to visit.       Review of Systems See HPI    Objective:   Physical Exam Physical Exam  Nursing note and vitals reviewed.  Constitutional: She is oriented to person, place, and time. She appears well-developed and well-nourished.  HENT:  Head: Normocephalic and atraumatic.  Cardiovascular: Normal rate and regular rhythm. Exam reveals no gallop and no friction rub.  No murmur heard.  Pulmonary/Chest: Breath sounds normal. She has no wheezes. She has no rales.  Abd  No CVA tenderness  BS +  Mild diffuse tenderness in all quadrants  No rebound no peritoneal signs Neurological: She is alert and oriented to person, place, and time.  Skin: Skin is warm and dry.  Psychiatric: She has a normal mood and  affect. Her behavior is normal.         Assessment & Plan:  Hematuria  U/A trace today  Will send for culture  Abd pain  HIDA and colonoscopy pending.  Will get CT with and w/o with persistant hematuria    Further management based on results

## 2014-01-20 LAB — CULTURE, URINE COMPREHENSIVE
Colony Count: NO GROWTH
ORGANISM ID, BACTERIA: NO GROWTH

## 2014-01-22 ENCOUNTER — Encounter (HOSPITAL_COMMUNITY)
Admission: RE | Admit: 2014-01-22 | Discharge: 2014-01-22 | Disposition: A | Discharge status: Home / Self Care | Payer: BC Managed Care – PPO | Source: Ambulatory Visit | Attending: Gastroenterology | Admitting: Gastroenterology

## 2014-01-22 ENCOUNTER — Ambulatory Visit (HOSPITAL_COMMUNITY)
Admission: RE | Admit: 2014-01-22 | Discharge: 2014-01-22 | Disposition: A | Discharge status: Home / Self Care | Payer: BC Managed Care – PPO | Source: Ambulatory Visit | Attending: Gastroenterology | Admitting: Gastroenterology

## 2014-01-22 DIAGNOSIS — R1013 Epigastric pain: Secondary | ICD-10-CM

## 2014-01-22 DIAGNOSIS — K7689 Other specified diseases of liver: Secondary | ICD-10-CM | POA: Insufficient documentation

## 2014-01-22 MED ORDER — TECHNETIUM TC 99M MEBROFENIN IV KIT
5.0000 | PACK | Freq: Once | INTRAVENOUS | Status: AC | PRN
  Administered 2014-01-22: 5 via INTRAVENOUS

## 2014-01-22 MED ORDER — SINCALIDE 5 MCG IJ SOLR
INTRAMUSCULAR | Status: AC
  Administered 2014-01-22: 4.18 ug
  Filled 2014-01-22: qty 5

## 2014-01-23 ENCOUNTER — Ambulatory Visit (HOSPITAL_BASED_OUTPATIENT_CLINIC_OR_DEPARTMENT_OTHER)
Admission: RE | Admit: 2014-01-23 | Discharge: 2014-01-23 | Disposition: A | Discharge status: Home / Self Care | Payer: BC Managed Care – PPO | Source: Ambulatory Visit | Attending: Internal Medicine | Admitting: Internal Medicine

## 2014-01-23 ENCOUNTER — Telehealth: Payer: Self-pay | Admitting: *Deleted

## 2014-01-23 DIAGNOSIS — K5730 Diverticulosis of large intestine without perforation or abscess without bleeding: Secondary | ICD-10-CM | POA: Insufficient documentation

## 2014-01-23 DIAGNOSIS — D4959 Neoplasm of unspecified behavior of other genitourinary organ: Secondary | ICD-10-CM | POA: Insufficient documentation

## 2014-01-23 DIAGNOSIS — R109 Unspecified abdominal pain: Secondary | ICD-10-CM

## 2014-01-23 DIAGNOSIS — R319 Hematuria, unspecified: Secondary | ICD-10-CM

## 2014-01-23 DIAGNOSIS — K7689 Other specified diseases of liver: Secondary | ICD-10-CM | POA: Insufficient documentation

## 2014-01-23 NOTE — Telephone Encounter (Signed)
Megan May called wanting her CT results from this morning.  I let her know that Dr Coralyn Mark has not reviewed them yet. She asked that we call her as soon as possible.

## 2014-01-23 NOTE — Telephone Encounter (Signed)
Pt wants CT results.

## 2014-01-24 ENCOUNTER — Telehealth: Payer: Self-pay | Admitting: *Deleted

## 2014-01-24 ENCOUNTER — Encounter: Payer: Self-pay | Admitting: Internal Medicine

## 2014-01-24 NOTE — Telephone Encounter (Signed)
Called pt to go over CT results - LMOM for pt to rtn call

## 2014-01-24 NOTE — Telephone Encounter (Signed)
Message copied by Gretchen Short on Wed Jan 24, 2014 11:18 AM ------      Message from: Emi Belfast D      Created: Wed Jan 24, 2014 11:16 AM       Mandy            Call pt and let her know that CT shows two small cysts in her liver, no kidney stones,  Diverticulosis (eat high fiber diet) and she still has a very large tumor on her ovary that it is very important to follow up with Dr. Lynnette Caffey her GYN MD>   None of the findings on this CT explains any left sided pain              Be sure to follow up with both her GYN and Dr. Collene Mares her GI MD ------

## 2014-01-24 NOTE — Telephone Encounter (Signed)
Spoke to pt about CT results and she is going to switch to a high fiber diet to see if it will help with left side pain even though nothing was noted on CT that would cause left side pain. She is also concerned about hematuria. I explained that a urine dip did show blood in urine but once it was cultured there was no bacteria found. Pt expressed understanding.

## 2014-01-24 NOTE — Telephone Encounter (Signed)
Cataleyah returned your call. Please call her at [phone]

## 2014-01-25 ENCOUNTER — Other Ambulatory Visit: Payer: Self-pay

## 2014-01-25 DIAGNOSIS — Z1231 Encounter for screening mammogram for malignant neoplasm of breast: Secondary | ICD-10-CM

## 2014-01-26 ENCOUNTER — Ambulatory Visit
Admission: RE | Admit: 2014-01-26 | Discharge: 2014-01-26 | Disposition: A | Discharge status: Home / Self Care | Payer: BC Managed Care – PPO | Source: Ambulatory Visit

## 2014-01-26 DIAGNOSIS — Z1231 Encounter for screening mammogram for malignant neoplasm of breast: Secondary | ICD-10-CM

## 2014-02-06 ENCOUNTER — Encounter: Payer: Self-pay | Admitting: *Deleted

## 2014-02-06 ENCOUNTER — Telehealth: Payer: Self-pay | Admitting: *Deleted

## 2014-02-06 DIAGNOSIS — R319 Hematuria, unspecified: Secondary | ICD-10-CM

## 2014-02-06 NOTE — Telephone Encounter (Signed)
Pt called in stated she had colonoscopy completed today and GI doctor wanted her to be evaluated by urology due to hematuria. I have place referral in system to [facility] as that is the only option. I will print demographics and last POCT Urine Dipstick results.

## 2014-04-22 ENCOUNTER — Encounter: Payer: Self-pay | Admitting: Internal Medicine

## 2014-04-22 DIAGNOSIS — R3129 Other microscopic hematuria: Secondary | ICD-10-CM | POA: Insufficient documentation

## 2014-04-26 ENCOUNTER — Encounter: Payer: Self-pay | Admitting: *Deleted

## 2014-05-02 ENCOUNTER — Encounter: Payer: Self-pay | Admitting: Internal Medicine

## 2014-05-02 ENCOUNTER — Ambulatory Visit (INDEPENDENT_AMBULATORY_CARE_PROVIDER_SITE_OTHER): Payer: BC Managed Care – PPO | Admitting: Internal Medicine

## 2014-05-02 VITALS — BP 166/80 | HR 66 | Temp 98.0°F | Resp 16 | Ht 66.0 in | Wt 193.0 lb

## 2014-05-02 DIAGNOSIS — F419 Anxiety disorder, unspecified: Secondary | ICD-10-CM

## 2014-05-02 DIAGNOSIS — Z23 Encounter for immunization: Secondary | ICD-10-CM | POA: Diagnosis not present

## 2014-05-02 DIAGNOSIS — R0789 Other chest pain: Secondary | ICD-10-CM | POA: Diagnosis not present

## 2014-05-02 DIAGNOSIS — I10 Essential (primary) hypertension: Secondary | ICD-10-CM

## 2014-05-02 DIAGNOSIS — K200 Eosinophilic esophagitis: Secondary | ICD-10-CM

## 2014-05-02 DIAGNOSIS — Z0000 Encounter for general adult medical examination without abnormal findings: Secondary | ICD-10-CM | POA: Diagnosis not present

## 2014-05-02 DIAGNOSIS — D126 Benign neoplasm of colon, unspecified: Secondary | ICD-10-CM

## 2014-05-02 DIAGNOSIS — K635 Polyp of colon: Secondary | ICD-10-CM | POA: Insufficient documentation

## 2014-05-02 DIAGNOSIS — F411 Generalized anxiety disorder: Secondary | ICD-10-CM

## 2014-05-02 LAB — POCT URINALYSIS DIPSTICK
BILIRUBIN UA: NEGATIVE
Blood, UA: NEGATIVE
GLUCOSE UA: NEGATIVE
KETONES UA: NEGATIVE
LEUKOCYTES UA: NEGATIVE
NITRITE UA: NEGATIVE
PH UA: 6.5
Protein, UA: NEGATIVE
Spec Grav, UA: 1.02
Urobilinogen, UA: NEGATIVE

## 2014-05-02 NOTE — Progress Notes (Signed)
Subjective:    Patient ID: Megan May, female    DOB: 23-Jun-1961, 53 y.o.   MRN: [id number]  HPI  Prior OV: Megan May is here for follow up of her abd pain (approx 4 months) and her hematuria. She is out of school now.  She has been evaluated by both GYN and GI. GYN did not feel left sided abd pain was GYN related. GI MD ordered Flovent for her eosinophilic esophagitis and Dexilant - pt has not taken either as yet as she is awaiting prior authorization.  She also will be undergoing a HIDA scan and colonsocopy in the future. See scanned notes.  She descirbes pain "from my ribs to my pelvis"  Hematuria She tells me her school has been closed down due to E coli out break. She denies Diarrhea, no dysuria or urgency. No fever at home   Megan May is here today for CPE  HM: MM done 01/2014   Pap neg 11/2013   She did have colonoscopy with Dr. Collene Mares and pt reports she found polyps  HTN  Pt reports she does not take her med daily  " I forget".  Some chest heaviness when she does not take her pill.  She has not had her BP pill most of the past 7 days.  No radiation, no SOB no diaphoresis no N/V  Chest pressure not exertional   Abd pain  She reports much less pain now   Anxiety  Eosinophilic esophagitis  Reports she does not like Dexilant and is not taking   Allergies  Allergen Reactions  . Penicillins Swelling and Rash   Past Medical History  Diagnosis Date  . Hypertension   . Migraine   . Anxiety   . Eosinophilic esophagitis   . Asthma   . Posttraumatic stress disorder   . Generalized anxiety disorder   . Migraines   . Hypersomnia, recurrent    Past Surgical History  Procedure Laterality Date  . Tonsillectomy and adenoidectomy  1969  . Fracture surgery  1973    R arm  . Cesarean section  2004   History   Social History  . Marital Status: Married    Spouse Name: Randall Hiss    Number of Children: 1  . Years of Education: 2 Masters   Occupational History  . SPECIAL  EDUCATOR     Teacher, early years/pre   Social History Main Topics  . Smoking status: Never Smoker   . Smokeless tobacco: Never Used  . Alcohol Use: No  . Drug Use: No  . Sexual Activity: Yes    Birth Control/ Protection: Surgical   Other Topics Concern  . Not on file   Social History Narrative   Patient is right handed, resides in home with husband and daughter. She consumes 16 oz of tea daily.   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   Family History  Problem Relation Age of Onset  . Hypertension Father   . Heart disease Father   . Diabetes Father   . Stroke Maternal Grandfather   . Stroke Maternal Grandmother    Patient Active Problem List   Diagnosis Date Noted  . Microscopic hematuria work up urology  see 01/2014 note 04/22/2014  . Hypersomnia, recurrent   . Dysarthria 05/01/2013  . Mastodynia 10/13/2012  . Mass of right breast 10/13/2012  . Essential hypertension, benign 06/29/2011  . Migraines 06/29/2011  . Anxiety 06/29/2011  . Eosinophilic esophagitis 82/50/5397  . Lupus anticoagulant positive 06/29/2011  Current Outpatient Prescriptions on File Prior to Visit  Medication Sig Dispense Refill  . albuterol (PROVENTIL HFA;VENTOLIN HFA) 108 (90 BASE) MCG/ACT inhaler Inhale 2 puffs into the lungs 3 (three) times daily. As needed      . ALPRAZolam (XANAX) 0.5 MG tablet Take 0.5 mg by mouth 2 (two) times daily.       Marland Kitchen diltiazem (TIAZAC) 360 MG 24 hr capsule Take one daily  30 capsule  3  . eletriptan (RELPAX) 40 MG tablet Take one at onset of hadache.   may repeat in 2 hours one time only if necessary  10 tablet  0  . [DISCONTINUED] DULoxetine (CYMBALTA) 30 MG capsule Take 60 mg by mouth daily.       . [DISCONTINUED] topiramate (TOPAMAX) 25 MG capsule Take 75 mg by mouth daily.        No current facility-administered medications on file prior to visit.       Review of Systems  Respiratory: Negative for cough, shortness of breath and wheezing.     Cardiovascular: Negative for palpitations and leg swelling.  Gastrointestinal: Negative for abdominal pain.       Objective:   Physical Exam  Physical Exam  Nursing note and vitals reviewed.  Constitutional: She is oriented to person, place, and time. She appears well-developed and well-nourished.  HENT:  Head: Normocephalic and atraumatic.  Right Ear: Tympanic membrane and ear canal normal. No drainage. Tympanic membrane is not injected and not erythematous.  Left Ear: Tympanic membrane and ear canal normal. No drainage. Tympanic membrane is not injected and not erythematous.  Nose: Nose normal. Right sinus exhibits no maxillary sinus tenderness and no frontal sinus tenderness. Left sinus exhibits no maxillary sinus tenderness and no frontal sinus tenderness.  Mouth/Throat: Oropharynx is clear and moist. No oral lesions. No oropharyngeal exudate.  Eyes: Conjunctivae and EOM are normal. Pupils are equal, round, and reactive to light.  Neck: Normal range of motion. Neck supple. No JVD present. Carotid bruit is not present. No mass and no thyromegaly present.  Cardiovascular: Normal rate, regular rhythm, S1 normal, S2 normal and intact distal pulses. Exam reveals no gallop and no friction rub.  No murmur heard.  Pulses:  Carotid pulses are 2+ on the right side, and 2+ on the left side.  Dorsalis pedis pulses are 2+ on the right side, and 2+ on the left side.  No carotid bruit. No LE edema  Pulmonary/Chest: Breath sounds normal. She has no wheezes. She has no rales. She exhibits no tenderness.  Breast no discrete mass no nipple discharge no axillary adenpathy bilaterally  Abdominal: Soft. Bowel sounds are normal. She exhibits no distension and no mass. There is no hepatosplenomegaly. There is no tenderness. There is no CVA tenderness. henocult not done she has had recent colonoscopy Musculoskeletal: Normal range of motion.  No active synovitis to joints.  Lymphadenopathy:  She has no  cervical adenopathy.  She has no axillary adenopathy.  Right: No inguinal and no supraclavicular adenopathy present.  Left: No inguinal and no supraclavicular adenopathy present.  Neurological: She is alert and oriented to person, place, and time. She has normal strength and normal reflexes. She displays no tremor. No cranial nerve deficit or sensory deficit. Coordination and gait normal.  Skin: Skin is warm and dry. No rash noted. No cyanosis. Nails show no clubbing.  Psychiatric: She has a normal mood and affect. Her speech is normal and behavior is normal. Cognition and memory are normal.  Assessment & Plan:  HM: see scanned sheet  Flu vaccine today   Counseled lung cancer screening she is a non smoker  HTN  Counseled it is very improtant to take her BP med daily   Chest pressure   I advised cardiac stress testing but pt declines.    Likely due to inadequately controlled BP  Ekg today no acute changes  Chronic abd pain  Improved     Anxiety continue meds    See me as needed

## 2014-05-03 ENCOUNTER — Encounter: Payer: Self-pay | Admitting: *Deleted

## 2014-05-10 ENCOUNTER — Telehealth: Payer: Self-pay

## 2014-05-10 NOTE — Telephone Encounter (Signed)
[place]  [phone]  Vida Roller called and she would like a referral for a 2nd opinion and for it not to be with [facility].

## 2014-05-10 NOTE — Telephone Encounter (Signed)
Which GYN would you like to send this patient to for a second opinion? It looks like there is surgery already scheduled by Linda Hedges, DO for tubal according to EPIC.

## 2014-05-22 ENCOUNTER — Encounter: Payer: Self-pay | Admitting: *Deleted

## 2014-05-27 ENCOUNTER — Encounter: Payer: Self-pay | Admitting: Internal Medicine

## 2014-05-27 DIAGNOSIS — K5730 Diverticulosis of large intestine without perforation or abscess without bleeding: Secondary | ICD-10-CM | POA: Insufficient documentation

## 2014-05-28 ENCOUNTER — Encounter: Payer: Self-pay | Admitting: *Deleted

## 2014-06-04 ENCOUNTER — Encounter: Payer: Self-pay | Admitting: Internal Medicine

## 2014-06-09 NOTE — H&P (Signed)
Megan May is an 53 y.o. 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  She recently developed urinary symptoms with negative urinalysis; likely bulk symptoms related to her ovarian mass.  Last ultrasound documented 7cm dermoid.  She wants definitive management.  Pertinent Gynecological History: Menses: regular Bleeding: regular Contraception: abstinence DES exposure: unknown Blood transfusions: none Sexually transmitted diseases: no past history Previous GYN Procedures: C/s  Last mammogram: normal Date: 2014 Last pap: normal Date: 2014 OB History: G1, P1   Menstrual History: Menarche age: n/a No LMP recorded.    Past Medical History  Diagnosis Date  . Hypertension   . Migraine   . Anxiety   . Eosinophilic esophagitis   . Asthma   . Posttraumatic stress disorder   . Generalized anxiety disorder   . Migraines   . Hypersomnia, recurrent     Past Surgical History  Procedure Laterality Date  . Tonsillectomy and adenoidectomy  1969  . Fracture surgery  1973    R arm  . Cesarean section  2004    Family History  Problem Relation Age of Onset  . Hypertension Father   . Heart disease Father   . Diabetes Father   . Stroke Maternal Grandfather   . Stroke Maternal Grandmother     Social History:  reports that she has never smoked. She has never used smokeless tobacco. She reports that she does not drink alcohol or use illicit drugs.  Allergies:  Allergies  Allergen Reactions  . Lactose Intolerance (Gi) Nausea Only  . Latex Rash  . Penicillins Swelling and Rash    No prescriptions prior to admission    Review of Systems  Constitutional: Negative for fever and chills.  Respiratory: Negative for cough.   Cardiovascular: Negative for chest pain and palpitations.  Gastrointestinal: Negative for abdominal pain.  Genitourinary: Positive for urgency and frequency. Negative for dysuria and hematuria.    There were no vitals taken for this  visit. Physical Exam  Constitutional: She is oriented to person, place, and time. She appears well-developed and well-nourished.  Respiratory: Effort normal and breath sounds normal.  GI: Soft. There is no rebound and no guarding.  Neurological: She is alert and oriented to person, place, and time.  Skin: Skin is warm and dry.  Psychiatric: She has a normal mood and affect. Her behavior is normal.    No results found for this or any previous visit (from the past 24 hour(s)).  No results found.  Assessment/Plan: 53yo G1P1 with right dermoid -Abdominal RSO and left salpingectomy  Ellanor Feuerstein 06/09/2014, 8:40 PM

## 2014-06-13 ENCOUNTER — Encounter (HOSPITAL_COMMUNITY): Payer: Self-pay

## 2014-06-13 ENCOUNTER — Encounter (HOSPITAL_COMMUNITY): Payer: Self-pay | Admitting: Anesthesiology

## 2014-06-13 ENCOUNTER — Encounter (HOSPITAL_COMMUNITY)
Admission: RE | Admit: 2014-06-13 | Discharge: 2014-06-13 | Disposition: A | Discharge status: Home / Self Care | Payer: BC Managed Care – PPO | Source: Ambulatory Visit | Attending: Obstetrics & Gynecology | Admitting: Obstetrics & Gynecology

## 2014-06-13 HISTORY — DX: Major depressive disorder, single episode, unspecified: F32.9

## 2014-06-13 HISTORY — DX: Depression, unspecified: F32.A

## 2014-06-13 LAB — CBC
HCT: 41.5 % (ref 36.0–46.0)
HEMOGLOBIN: 14.6 g/dL (ref 12.0–15.0)
MCH: 29.7 pg (ref 26.0–34.0)
MCHC: 35.2 g/dL (ref 30.0–36.0)
MCV: 84.5 fL (ref 78.0–100.0)
PLATELETS: 239 10*3/uL (ref 150–400)
RBC: 4.91 MIL/uL (ref 3.87–5.11)
RDW: 12.5 % (ref 11.5–15.5)
WBC: 9.6 10*3/uL (ref 4.0–10.5)

## 2014-06-13 LAB — COMPREHENSIVE METABOLIC PANEL
ALT: 13 U/L (ref 0–35)
AST: 17 U/L (ref 0–37)
Albumin: 3.9 g/dL (ref 3.5–5.2)
Alkaline Phosphatase: 76 U/L (ref 39–117)
Anion gap: 12 (ref 5–15)
BILIRUBIN TOTAL: 1.2 mg/dL (ref 0.3–1.2)
BUN: 11 mg/dL (ref 6–23)
CO2: 25 meq/L (ref 19–32)
Calcium: 9 mg/dL (ref 8.4–10.5)
Chloride: 102 mEq/L (ref 96–112)
Creatinine, Ser: 0.67 mg/dL (ref 0.50–1.10)
GLUCOSE: 106 mg/dL — AB (ref 70–99)
Potassium: 4.1 mEq/L (ref 3.7–5.3)
Sodium: 139 mEq/L (ref 137–147)
Total Protein: 6.9 g/dL (ref 6.0–8.3)

## 2014-06-13 MED ORDER — GENTAMICIN SULFATE 40 MG/ML IJ SOLN
INTRAVENOUS | Status: AC
  Administered 2014-06-14: 115 mL via INTRAVENOUS
  Filled 2014-06-13: qty 9

## 2014-06-13 NOTE — Patient Instructions (Signed)
Your procedure is scheduled on:06/14/14  Enter through the Main Entrance at :[place] up desk phone and dial [phone] and inform us of your arrival.  Please call [phone] if you have any problems the morning of surgery.  Remember: Do not eat food or drink liquids, including water, after midnight: tonight   You may brush your teeth the morning of surgery.  Take these meds the morning of surgery with a sip of water: Dexilant  DO NOT wear jewelry, eye make-up, lipstick,body lotion, or dark fingernail polish.  (Polished toes are ok) You may wear deodorant.  If you are to be admitted after surgery, leave suitcase in car until your room has been assigned. Patients discharged on the day of surgery will not be allowed to drive home. Wear loose fitting, comfortable clothes for your ride home.

## 2014-06-13 NOTE — Anesthesia Preprocedure Evaluation (Addendum)
Anesthesia Evaluation  Patient identified by MRN, date of birth, ID band Patient awake    Reviewed: Allergy & Precautions, H&P , NPO status , Patient's Chart, lab work & pertinent test results  Airway Mallampati: III  TM Distance: >3 FB Neck ROM: Full    Dental no notable dental hx. (+) Teeth Intact   Pulmonary asthma ,  breath sounds clear to auscultation  Pulmonary exam normal       Cardiovascular hypertension, Pt. on medications Rhythm:Regular Rate:Normal     Neuro/Psych  Headaches, PSYCHIATRIC DISORDERS Anxiety Depression Hx/o PTSD   GI/Hepatic Neg liver ROS, Hx/o esophagitis   Endo/Other  Obesity  Renal/GU negative Renal ROS  negative genitourinary   Musculoskeletal negative musculoskeletal ROS (+)   Abdominal (+) + obese,   Peds  Hematology   Anesthesia Other Findings   Reproductive/Obstetrics Right ovarian dermoid cyst                            Anesthesia Physical Anesthesia Plan  ASA: II  Anesthesia Plan: General   Post-op Pain Management:    Induction: Intravenous  Airway Management Planned: Oral ETT  Additional Equipment:   Intra-op Plan:   Post-operative Plan: Extubation in OR  Informed Consent: I have reviewed the patients History and Physical, chart, labs and discussed the procedure including the risks, benefits and alternatives for the proposed anesthesia with the patient or authorized representative who has indicated his/her understanding and acceptance.   Dental advisory given  Plan Discussed with: Anesthesiologist, CRNA and Surgeon  Anesthesia Plan Comments:         Anesthesia Quick Evaluation

## 2014-06-14 ENCOUNTER — Ambulatory Visit (HOSPITAL_COMMUNITY): Payer: BC Managed Care – PPO | Admitting: Anesthesiology

## 2014-06-14 ENCOUNTER — Encounter (HOSPITAL_COMMUNITY): Payer: Self-pay

## 2014-06-14 ENCOUNTER — Encounter (HOSPITAL_COMMUNITY)
Admission: RE | Disposition: A | Discharge status: Home / Self Care | Payer: Self-pay | Source: Ambulatory Visit | Attending: Obstetrics & Gynecology

## 2014-06-14 ENCOUNTER — Inpatient Hospital Stay (HOSPITAL_COMMUNITY)
Admission: RE | Admit: 2014-06-14 | Discharge: 2014-06-16 | DRG: 743 | Disposition: A | Discharge status: Home / Self Care | Payer: BC Managed Care – PPO | Source: Ambulatory Visit | Attending: Obstetrics & Gynecology | Admitting: Obstetrics & Gynecology

## 2014-06-14 DIAGNOSIS — Z880 Allergy status to penicillin: Secondary | ICD-10-CM

## 2014-06-14 DIAGNOSIS — Z91040 Latex allergy status: Secondary | ICD-10-CM

## 2014-06-14 DIAGNOSIS — I10 Essential (primary) hypertension: Secondary | ICD-10-CM | POA: Diagnosis present

## 2014-06-14 DIAGNOSIS — Z91011 Allergy to milk products: Secondary | ICD-10-CM

## 2014-06-14 DIAGNOSIS — D270 Benign neoplasm of right ovary: Principal | ICD-10-CM | POA: Diagnosis present

## 2014-06-14 DIAGNOSIS — J45909 Unspecified asthma, uncomplicated: Secondary | ICD-10-CM | POA: Diagnosis present

## 2014-06-14 DIAGNOSIS — F4310 Post-traumatic stress disorder, unspecified: Secondary | ICD-10-CM | POA: Diagnosis present

## 2014-06-14 DIAGNOSIS — G4710 Hypersomnia, unspecified: Secondary | ICD-10-CM | POA: Diagnosis present

## 2014-06-14 DIAGNOSIS — G43909 Migraine, unspecified, not intractable, without status migrainosus: Secondary | ICD-10-CM | POA: Diagnosis present

## 2014-06-14 DIAGNOSIS — Z8249 Family history of ischemic heart disease and other diseases of the circulatory system: Secondary | ICD-10-CM

## 2014-06-14 DIAGNOSIS — Z9889 Other specified postprocedural states: Secondary | ICD-10-CM

## 2014-06-14 DIAGNOSIS — F411 Generalized anxiety disorder: Secondary | ICD-10-CM | POA: Diagnosis present

## 2014-06-14 HISTORY — PX: LAPAROTOMY: SHX154

## 2014-06-14 LAB — PREGNANCY, URINE: Preg Test, Ur: NEGATIVE

## 2014-06-14 SURGERY — LAPAROTOMY
Anesthesia: General | Laterality: Bilateral

## 2014-06-14 MED ORDER — PROPOFOL 10 MG/ML IV BOLUS
INTRAVENOUS | Status: DC | PRN
  Administered 2014-06-14: 200 mg via INTRAVENOUS

## 2014-06-14 MED ORDER — LIDOCAINE HCL (CARDIAC) 20 MG/ML IV SOLN
INTRAVENOUS | Status: AC
  Filled 2014-06-14: qty 5

## 2014-06-14 MED ORDER — ALBUTEROL SULFATE (2.5 MG/3ML) 0.083% IN NEBU: 3.0000 mL | INHALATION_SOLUTION | Freq: Four times a day (QID) | RESPIRATORY_TRACT | Status: DC | PRN

## 2014-06-14 MED ORDER — ONDANSETRON HCL 4 MG/2ML IJ SOLN: 4.0000 mg | Freq: Four times a day (QID) | INTRAMUSCULAR | Status: DC | PRN

## 2014-06-14 MED ORDER — PANTOPRAZOLE SODIUM 40 MG PO TBEC
40.0000 mg | DELAYED_RELEASE_TABLET | Freq: Every day | ORAL | Status: DC
Start: 2014-06-14 — End: 2014-06-16
  Administered 2014-06-15 – 2014-06-16 (×2): 40 mg via ORAL
  Filled 2014-06-14 (×2): qty 1

## 2014-06-14 MED ORDER — SCOPOLAMINE 1 MG/3DAYS TD PT72
MEDICATED_PATCH | TRANSDERMAL | Status: AC
  Administered 2014-06-14: 1.5 mg via TRANSDERMAL
  Filled 2014-06-14: qty 1

## 2014-06-14 MED ORDER — LIDOCAINE HCL (CARDIAC) 20 MG/ML IV SOLN
INTRAVENOUS | Status: DC | PRN
  Administered 2014-06-14: 50 mg via INTRAVENOUS

## 2014-06-14 MED ORDER — DEXAMETHASONE SODIUM PHOSPHATE 4 MG/ML IJ SOLN
INTRAMUSCULAR | Status: AC
  Filled 2014-06-14: qty 1

## 2014-06-14 MED ORDER — DEXTROSE IN LACTATED RINGERS 5 % IV SOLN
INTRAVENOUS | Status: DC
Start: 2014-06-14 — End: 2014-06-16
  Administered 2014-06-14 (×2): via INTRAVENOUS

## 2014-06-14 MED ORDER — GLYCOPYRROLATE 0.2 MG/ML IJ SOLN
INTRAMUSCULAR | Status: AC
  Filled 2014-06-14: qty 2

## 2014-06-14 MED ORDER — BUPIVACAINE HCL (PF) 0.25 % IJ SOLN
INTRAMUSCULAR | Status: AC
  Filled 2014-06-14: qty 30

## 2014-06-14 MED ORDER — FENTANYL CITRATE 0.05 MG/ML IJ SOLN
INTRAMUSCULAR | Status: AC
  Filled 2014-06-14: qty 5

## 2014-06-14 MED ORDER — SENNA 8.6 MG PO TABS
1.0000 | ORAL_TABLET | Freq: Two times a day (BID) | ORAL | Status: DC
  Administered 2014-06-14 – 2014-06-16 (×4): 8.6 mg via ORAL
  Filled 2014-06-14 (×5): qty 1

## 2014-06-14 MED ORDER — ALPRAZOLAM 0.5 MG PO TABS: 0.5000 mg | ORAL_TABLET | Freq: Two times a day (BID) | ORAL | Status: DC | PRN

## 2014-06-14 MED ORDER — ROCURONIUM BROMIDE 100 MG/10ML IV SOLN
INTRAVENOUS | Status: DC | PRN
  Administered 2014-06-14: 40 mg via INTRAVENOUS

## 2014-06-14 MED ORDER — METOCLOPRAMIDE HCL 5 MG/ML IJ SOLN: 10.0000 mg | Freq: Once | INTRAMUSCULAR | Status: DC | PRN

## 2014-06-14 MED ORDER — ONDANSETRON HCL 4 MG/2ML IJ SOLN
INTRAMUSCULAR | Status: AC
  Filled 2014-06-14: qty 2

## 2014-06-14 MED ORDER — OXYCODONE-ACETAMINOPHEN 5-325 MG PO TABS: 1.0000 | ORAL_TABLET | ORAL | Status: DC | PRN

## 2014-06-14 MED ORDER — EPHEDRINE 5 MG/ML INJ
INTRAVENOUS | Status: AC
  Filled 2014-06-14: qty 10

## 2014-06-14 MED ORDER — ALUM & MAG HYDROXIDE-SIMETH 200-200-20 MG/5ML PO SUSP: 30.0000 mL | ORAL | Status: DC | PRN

## 2014-06-14 MED ORDER — EPHEDRINE SULFATE 50 MG/ML IJ SOLN
INTRAMUSCULAR | Status: DC | PRN
Start: 2014-06-14 — End: 2014-06-14
  Administered 2014-06-14: 20 mg via INTRAVENOUS

## 2014-06-14 MED ORDER — NEOSTIGMINE METHYLSULFATE 10 MG/10ML IV SOLN
INTRAVENOUS | Status: AC
Start: 2014-06-14 — End: 2014-06-14
  Filled 2014-06-14: qty 1

## 2014-06-14 MED ORDER — KETOROLAC TROMETHAMINE 30 MG/ML IJ SOLN
INTRAMUSCULAR | Status: AC
  Filled 2014-06-14: qty 1

## 2014-06-14 MED ORDER — GLYCOPYRROLATE 0.2 MG/ML IJ SOLN
INTRAMUSCULAR | Status: AC
  Filled 2014-06-14: qty 1

## 2014-06-14 MED ORDER — FENTANYL CITRATE 0.05 MG/ML IJ SOLN
INTRAMUSCULAR | Status: DC | PRN
  Administered 2014-06-14: 100 ug via INTRAVENOUS
  Administered 2014-06-14: 50 ug via INTRAVENOUS
  Administered 2014-06-14: 100 ug via INTRAVENOUS

## 2014-06-14 MED ORDER — MIDAZOLAM HCL 2 MG/2ML IJ SOLN
INTRAMUSCULAR | Status: AC
  Filled 2014-06-14: qty 2

## 2014-06-14 MED ORDER — HYDROMORPHONE HCL 1 MG/ML IJ SOLN
INTRAMUSCULAR | Status: AC
  Filled 2014-06-14: qty 1

## 2014-06-14 MED ORDER — MIDAZOLAM HCL 5 MG/5ML IJ SOLN
INTRAMUSCULAR | Status: DC | PRN
  Administered 2014-06-14: 2 mg via INTRAVENOUS

## 2014-06-14 MED ORDER — NEOSTIGMINE METHYLSULFATE 10 MG/10ML IV SOLN
INTRAVENOUS | Status: DC | PRN
  Administered 2014-06-14: 4 mg via INTRAVENOUS

## 2014-06-14 MED ORDER — MENTHOL 3 MG MT LOZG: 1.0000 | LOZENGE | OROMUCOSAL | Status: DC | PRN

## 2014-06-14 MED ORDER — GLYCOPYRROLATE 0.2 MG/ML IJ SOLN
INTRAMUSCULAR | Status: DC | PRN
  Administered 2014-06-14: 0.6 mg via INTRAVENOUS
  Administered 2014-06-14: 0.2 mg via INTRAVENOUS

## 2014-06-14 MED ORDER — LACTATED RINGERS IV SOLN
INTRAVENOUS | Status: DC
  Administered 2014-06-14 (×2): via INTRAVENOUS

## 2014-06-14 MED ORDER — DOCUSATE SODIUM 100 MG PO CAPS
100.0000 mg | ORAL_CAPSULE | Freq: Two times a day (BID) | ORAL | Status: DC
  Administered 2014-06-14 – 2014-06-16 (×4): 100 mg via ORAL
  Filled 2014-06-14 (×4): qty 1

## 2014-06-14 MED ORDER — HYDROMORPHONE HCL 1 MG/ML IJ SOLN
0.2500 mg | INTRAMUSCULAR | Status: DC | PRN
  Administered 2014-06-14 (×4): 0.5 mg via INTRAVENOUS

## 2014-06-14 MED ORDER — PROPOFOL 10 MG/ML IV EMUL
INTRAVENOUS | Status: AC
  Filled 2014-06-14: qty 20

## 2014-06-14 MED ORDER — ONDANSETRON HCL 4 MG PO TABS: 4.0000 mg | ORAL_TABLET | Freq: Four times a day (QID) | ORAL | Status: DC | PRN

## 2014-06-14 MED ORDER — DIPHENHYDRAMINE HCL 50 MG/ML IJ SOLN: 25.0000 mg | Freq: Once | INTRAMUSCULAR | Status: DC

## 2014-06-14 MED ORDER — HYDROMORPHONE HCL 1 MG/ML IJ SOLN
0.2000 mg | INTRAMUSCULAR | Status: DC | PRN
  Administered 2014-06-14: 0.6 mg via INTRAVENOUS
  Filled 2014-06-14: qty 1

## 2014-06-14 MED ORDER — ONDANSETRON HCL 4 MG/2ML IJ SOLN
INTRAMUSCULAR | Status: DC | PRN
  Administered 2014-06-14: 4 mg via INTRAVENOUS

## 2014-06-14 MED ORDER — KETOROLAC TROMETHAMINE 30 MG/ML IJ SOLN
INTRAMUSCULAR | Status: DC | PRN
  Administered 2014-06-14: 30 mg via INTRAVENOUS

## 2014-06-14 MED ORDER — MEPERIDINE HCL 25 MG/ML IJ SOLN: 6.2500 mg | INTRAMUSCULAR | Status: DC | PRN

## 2014-06-14 MED ORDER — KETOROLAC TROMETHAMINE 30 MG/ML IJ SOLN: 30.0000 mg | Freq: Once | INTRAMUSCULAR | Status: DC

## 2014-06-14 MED ORDER — ROCURONIUM BROMIDE 100 MG/10ML IV SOLN
INTRAVENOUS | Status: AC
Start: 2014-06-14 — End: 2014-06-14
  Filled 2014-06-14: qty 1

## 2014-06-14 MED ORDER — SCOPOLAMINE 1 MG/3DAYS TD PT72: 1.0000 | MEDICATED_PATCH | TRANSDERMAL | Status: DC

## 2014-06-14 MED ORDER — SCOPOLAMINE 1 MG/3DAYS TD PT72
1.0000 | MEDICATED_PATCH | Freq: Once | TRANSDERMAL | Status: DC
  Administered 2014-06-14: 1.5 mg via TRANSDERMAL

## 2014-06-14 MED ORDER — DEXTROSE IN LACTATED RINGERS 5 % IV SOLN: INTRAVENOUS | Status: DC

## 2014-06-14 MED ORDER — BUPIVACAINE HCL (PF) 0.25 % IJ SOLN
INTRAMUSCULAR | Status: DC | PRN
  Administered 2014-06-14: 30 mL

## 2014-06-14 MED ORDER — KETOROLAC TROMETHAMINE 30 MG/ML IJ SOLN
30.0000 mg | Freq: Four times a day (QID) | INTRAMUSCULAR | Status: DC
  Filled 2014-06-14: qty 1

## 2014-06-14 MED ORDER — DEXAMETHASONE SODIUM PHOSPHATE 4 MG/ML IJ SOLN
INTRAMUSCULAR | Status: DC | PRN
  Administered 2014-06-14: 4 mg via INTRAVENOUS

## 2014-06-14 MED ORDER — SIMETHICONE 80 MG PO CHEW: 80.0000 mg | CHEWABLE_TABLET | Freq: Four times a day (QID) | ORAL | Status: DC | PRN

## 2014-06-14 MED ORDER — KETOROLAC TROMETHAMINE 30 MG/ML IJ SOLN
30.0000 mg | Freq: Four times a day (QID) | INTRAMUSCULAR | Status: DC
  Administered 2014-06-15: 30 mg via INTRAMUSCULAR
  Filled 2014-06-14: qty 1

## 2014-06-14 SURGICAL SUPPLY — 34 items
BENZOIN TINCTURE PRP APPL 2/3 (GAUZE/BANDAGES/DRESSINGS) ×2 IMPLANT
BLADE SURG 10 STRL SS (BLADE) ×4 IMPLANT
CANISTER SUCT 3000ML (MISCELLANEOUS) ×2 IMPLANT
CHLORAPREP W/TINT 26ML (MISCELLANEOUS) ×2 IMPLANT
CLOTH BEACON ORANGE TIMEOUT ST (SAFETY) ×2 IMPLANT
CONT PATH 16OZ SNAP LID 3702 (MISCELLANEOUS) ×2 IMPLANT
DRAPE CESAREAN BIRTH W POUCH (DRAPES) ×2 IMPLANT
DRAPE WARM FLUID 44X44 (DRAPE) IMPLANT
DRSG OPSITE POSTOP 4X10 (GAUZE/BANDAGES/DRESSINGS) ×2 IMPLANT
GLOVE BIO SURGEON STRL SZ 6 (GLOVE) ×2 IMPLANT
GLOVE BIOGEL PI IND STRL 6 (GLOVE) ×2 IMPLANT
GLOVE BIOGEL PI INDICATOR 6 (GLOVE) ×2
GOWN STRL REUS W/TWL LRG LVL3 (GOWN DISPOSABLE) ×6 IMPLANT
NEEDLE HYPO 25X1 1.5 SAFETY (NEEDLE) ×2 IMPLANT
NS IRRIG 1000ML POUR BTL (IV SOLUTION) ×2 IMPLANT
PACK ABDOMINAL GYN (CUSTOM PROCEDURE TRAY) ×2 IMPLANT
PAD OB MATERNITY 4.3X12.25 (PERSONAL CARE ITEMS) ×2 IMPLANT
SPONGE LAP 18X18 X RAY DECT (DISPOSABLE) ×4 IMPLANT
STAPLER VISISTAT 35W (STAPLE) ×2 IMPLANT
STRIP CLOSURE SKIN 1/2X4 (GAUZE/BANDAGES/DRESSINGS) ×2 IMPLANT
SUT MON AB 2-0 CT1 27 (SUTURE) ×2 IMPLANT
SUT MON AB-0 CT1 36 (SUTURE) IMPLANT
SUT PLAIN 2 0 XLH (SUTURE) IMPLANT
SUT VIC AB 0 CT1 18XCR BRD8 (SUTURE) ×1 IMPLANT
SUT VIC AB 0 CT1 27 (SUTURE) ×2
SUT VIC AB 0 CT1 27XBRD ANBCTR (SUTURE) ×2 IMPLANT
SUT VIC AB 0 CT1 8-18 (SUTURE) ×1
SUT VIC AB 4-0 KS 27 (SUTURE) ×2 IMPLANT
SUT VICRYL 0 TIES 12 18 (SUTURE) ×2 IMPLANT
SYR CONTROL 10ML LL (SYRINGE) ×2 IMPLANT
TOWEL OR 17X24 6PK STRL BLUE (TOWEL DISPOSABLE) ×4 IMPLANT
TRAY FOLEY BAG SILVER LF 16FR (CATHETERS) ×2 IMPLANT
TRAY FOLEY CATH 14FR (SET/KITS/TRAYS/PACK) IMPLANT
WATER STERILE IRR 1000ML POUR (IV SOLUTION) ×2 IMPLANT

## 2014-06-14 NOTE — Plan of Care (Signed)
Problem: Phase II Progression Outcomes Goal: Pain controlled Outcome: Progressing                  

## 2014-06-14 NOTE — Plan of Care (Signed)
Problem: Phase II Progression Outcomes Goal: Other Phase II Outcomes/Goals Outcome: Progressing

## 2014-06-14 NOTE — Plan of Care (Signed)
Problem: Phase II Progression Outcomes Goal: Afebrile, VS remain stable Outcome: Progressing

## 2014-06-14 NOTE — Plan of Care (Signed)
Problem: Phase I Progression Outcomes Goal: Pain controlled with appropriate interventions Outcome: Progressing     

## 2014-06-14 NOTE — Addendum Note (Signed)
Addendum  created 06/14/14 1619 by Alvy Bimler, CRNA   Modules edited: Notes Section   Notes Section:  File: [id number]

## 2014-06-14 NOTE — Plan of Care (Signed)
Problem: Phase I Progression Outcomes Goal: VS, stable, temp < 100.4 degrees F Outcome: Progressing

## 2014-06-14 NOTE — Plan of Care (Signed)
Problem: Phase II Progression Outcomes Goal: Progress activity as tolerated unless otherwise ordered Outcome: Progressing     

## 2014-06-14 NOTE — Progress Notes (Signed)
No change to H&P.  Megan Bugge, DO 

## 2014-06-14 NOTE — Plan of Care (Signed)
Problem: Phase III Progression Outcomes Goal: Pain controlled on oral analgesia Outcome: Progressing     

## 2014-06-14 NOTE — Transfer of Care (Signed)
Immediate Anesthesia Transfer of Care Note  Patient: Megan May  Procedure(s) Performed: Procedure(s): LAPAROTOMY with right  salpingo-oophorectomy and left salpingectomy (Bilateral)  Patient Location: PACU  Anesthesia Type:General  Level of Consciousness: awake, alert  and oriented  Airway & Oxygen Therapy: Patient Spontanous Breathing and Patient connected to nasal cannula oxygen  Post-op Assessment: Report given to PACU RN  Post vital signs: Reviewed  Complications: No apparent anesthesia complications

## 2014-06-14 NOTE — Plan of Care (Signed)
Problem: Phase I Progression Outcomes Goal: Admission history reviewed Outcome: Completed/Met Date Met:  06/14/14

## 2014-06-14 NOTE — Plan of Care (Signed)
Problem: Phase I Progression Outcomes Goal: I & O every 4 hrs or as ordered Outcome: Progressing

## 2014-06-14 NOTE — Plan of Care (Signed)
Problem: Phase II Progression Outcomes Goal: Incision/dressings dry and intact Outcome: Progressing

## 2014-06-14 NOTE — Plan of Care (Signed)
Problem: Phase I Progression Outcomes Goal: IS, TCDB as ordered Outcome: Progressing

## 2014-06-14 NOTE — Op Note (Signed)
Megan May PROCEDURE DATE: 06/14/2014  PREOPERATIVE DIAGNOSIS:  Persistent right adnexal mass, suspicious for teratoma POSTOPERATIVE DIAGNOSIS:  SAA SURGEON:  Linda Hedges, DO OPERATION:  Abdominal RSO and risk-reducing left salpingectomy ANESTHESIA:  General endotracheal.  INDICATIONS: The patient is a 53 y.o. G2P1011 with history of persistent 7cm right adnexal mass, suspicious for teratoma. The patient made a decision to undergo definite surgical treatment. On the preoperative visit, the risks, benefits, indications, and alternatives of the procedure were reviewed with the patient.  On the day of surgery, the risks of surgery were again discussed with the patient including but not limited to: bleeding which may require transfusion or reoperation; infection which may require antibiotics; injury to bowel, bladder, ureters or other surrounding organs; need for additional procedures; thromboembolic phenomenon, incisional problems and other postoperative/anesthesia complications. Written informed consent was obtained.    OPERATIVE FINDINGS: Small, normal appearing uterus.  Normal fallopian tubes bilaterally.  Normal left ovary.  Enlarged right ovary with mass removed intact.  Mass was opened in emesis basin and filled with sebaceous fluid, hair and calcified components.    ESTIMATED BLOOD LOSS: 50 ml SPECIMENS: Right fallopian tube and ovary and left fallopian tube sent to pathology COMPLICATIONS:  None immediate.   DESCRIPTION OF PROCEDURE: The patient received intravenous antibiotics and had sequential compression devices applied to her lower extremities while in the preoperative area.   She was taken to the operating room and placed under general anesthesia without difficulty and found to be adequate.The abdomen and perineum were prepped and draped in a sterile manner, and a Foley catheter was inserted into the bladder and attached to constant drainage. After an adequate timeout was  performed, a Pfannensteil skin incision was made. This incision was taken down to the fascia using electrocautery with care given to maintain good hemostasis. The fascia was incised in the midline and the fascial incision was then extended bilaterally using electrocautery without difficulty. The fascia was then dissected off the underlying rectus muscles using blunt and sharp dissection. The rectus muscles were split bluntly in the midline and the peritoneum entered sharply without complication. This peritoneal incision was then extended superiorly and inferiorly with care given to prevent bowel or bladder injury. Upon entry into the abdominal cavity, the upper abdomen was inspected and found to be normal. Attention was then turned to the pelvis. A moist laparotomy sponge was used to pack the bowel.  The right fallopian tube and ovary with mass was elevated out of the pelvis.  The IP was clamped, doubly suture ligated and the tube and ovary with mass was excised intact.  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  Hemostasis of the pedicle was noted.  Attention was then turned to the contralateral adnexa.  A normal ovary was noted.  A clamp was placed across the fallopian tube and it was excised and sent to pathology.  The pedicle was suture ligated as well and hemostasis noted.  Both pedicles were inspected and hemostasis ensured prior to removing bowel pack.  The peritoneum was closed with 2-0 Monocryl, and the fascia was closed with 0 Vicryl in a running fashion. The skin was closed with a 4-0 Monocryl subcuticular stitch. Sponge, lap, needle, and instrument counts were correct times two. The patient was taken to the recovery area awake, extubated and in stable condition.

## 2014-06-14 NOTE — Anesthesia Postprocedure Evaluation (Signed)
  Anesthesia Post-op Note  Patient: Megan May  Procedure(s) Performed: Procedure(s): LAPAROTOMY with right  salpingo-oophorectomy and left salpingectomy (Bilateral)  Patient Location: PACU and [facility]  Anesthesia Type:General  Level of Consciousness: awake, alert , oriented and patient cooperative  Airway and Oxygen Therapy: Patient Spontanous Breathing  Post-op Pain: none  Post-op Assessment: Post-op Vital signs reviewed, Patient's Cardiovascular Status Stable, Respiratory Function Stable, Patent Airway, No signs of Nausea or vomiting, Adequate PO intake and Pain level controlled  Post-op Vital Signs: Reviewed and stable  Last Vitals:  Filed Vitals:   06/14/14 1130  BP: 102/40  Pulse: 63  Temp: 36.7 C  Resp: 16    Complications: No apparent anesthesia complications

## 2014-06-14 NOTE — Plan of Care (Signed)
Problem: Phase I Progression Outcomes Goal: Pain controlled with appropriate interventions Outcome: Completed/Met Date Met:  06/14/14 Goal: Dangle/OOB as tolerated per MD order Outcome: Completed/Met Date Met:  06/14/14 Goal: VS, stable, temp < 100.4 degrees F Outcome: Completed/Met Date Met:  06/14/14 Goal: I & O every 4 hrs or as ordered Outcome: Completed/Met Date Met:  06/14/14 Goal: IS, TCDB as ordered Outcome: Completed/Met Date Met:  06/14/14 Goal: Other Phase I Outcomes/Goals Outcome: Completed/Met Date Met:  06/14/14  Problem: Phase II Progression Outcomes Goal: Pain controlled Outcome: Completed/Met Date Met:  06/14/14 Goal: Progress activity as tolerated unless otherwise ordered Outcome: Completed/Met Date Met:  06/14/14 Goal: Afebrile, VS remain stable Outcome: Completed/Met Date Met:  06/14/14

## 2014-06-14 NOTE — Plan of Care (Signed)
Problem: Phase I Progression Outcomes Goal: Dangle/OOB as tolerated per MD order Outcome: Progressing

## 2014-06-14 NOTE — Anesthesia Postprocedure Evaluation (Signed)
  Anesthesia Post-op Note  Anesthesia Post Note  Patient: Megan May  Procedure(s) Performed: Procedure(s) (LRB): LAPAROTOMY with right  salpingo-oophorectomy and left salpingectomy (Bilateral)  Anesthesia type: General  Patient location: PACU  Post pain: Pain level controlled  Post assessment: Post-op Vital signs reviewed  Last Vitals:  Filed Vitals:   06/14/14 0930  BP: 103/50  Pulse: 61  Temp:   Resp: 15    Post vital signs: Reviewed  Level of consciousness: sedated  Complications: No apparent anesthesia complications

## 2014-06-14 NOTE — Progress Notes (Signed)
-  NOS- Patient has no current c/o.  Pain and nausea well-controlled.  No CP/SOB.  Tolerating clears.  VSS.  UOP clear yellow and adequate. Gen: A&O x 3 Abd: soft, ND, honeycomb ~50% stained.   Ext: no c/c/e, SCDs on  POD#0 s/p abdominal RSO and left salpingectomy -Advance diet -Ambulate -Continue pain and nausea control -D/C foley and IVF in AM -AM labs pending  Linda Hedges, DO

## 2014-06-15 ENCOUNTER — Encounter (HOSPITAL_COMMUNITY): Payer: Self-pay | Admitting: Obstetrics & Gynecology

## 2014-06-15 DIAGNOSIS — Z8249 Family history of ischemic heart disease and other diseases of the circulatory system: Secondary | ICD-10-CM | POA: Diagnosis not present

## 2014-06-15 DIAGNOSIS — Z91011 Allergy to milk products: Secondary | ICD-10-CM | POA: Diagnosis not present

## 2014-06-15 DIAGNOSIS — J45909 Unspecified asthma, uncomplicated: Secondary | ICD-10-CM | POA: Diagnosis present

## 2014-06-15 DIAGNOSIS — Z880 Allergy status to penicillin: Secondary | ICD-10-CM | POA: Diagnosis not present

## 2014-06-15 DIAGNOSIS — Z91040 Latex allergy status: Secondary | ICD-10-CM | POA: Diagnosis not present

## 2014-06-15 DIAGNOSIS — G4710 Hypersomnia, unspecified: Secondary | ICD-10-CM | POA: Diagnosis present

## 2014-06-15 DIAGNOSIS — D270 Benign neoplasm of right ovary: Secondary | ICD-10-CM | POA: Diagnosis present

## 2014-06-15 DIAGNOSIS — G43909 Migraine, unspecified, not intractable, without status migrainosus: Secondary | ICD-10-CM | POA: Diagnosis present

## 2014-06-15 DIAGNOSIS — F411 Generalized anxiety disorder: Secondary | ICD-10-CM | POA: Diagnosis present

## 2014-06-15 DIAGNOSIS — F4310 Post-traumatic stress disorder, unspecified: Secondary | ICD-10-CM | POA: Diagnosis present

## 2014-06-15 DIAGNOSIS — I10 Essential (primary) hypertension: Secondary | ICD-10-CM | POA: Diagnosis present

## 2014-06-15 LAB — CBC
HEMATOCRIT: 37.8 % (ref 36.0–46.0)
Hemoglobin: 12.9 g/dL (ref 12.0–15.0)
MCH: 29.4 pg (ref 26.0–34.0)
MCHC: 34.1 g/dL (ref 30.0–36.0)
MCV: 86.1 fL (ref 78.0–100.0)
Platelets: 230 10*3/uL (ref 150–400)
RBC: 4.39 MIL/uL (ref 3.87–5.11)
RDW: 12.5 % (ref 11.5–15.5)
WBC: 16 10*3/uL — AB (ref 4.0–10.5)

## 2014-06-15 LAB — BASIC METABOLIC PANEL
Anion gap: 11 (ref 5–15)
BUN: 7 mg/dL (ref 6–23)
CHLORIDE: 105 meq/L (ref 96–112)
CO2: 24 mEq/L (ref 19–32)
Calcium: 9.2 mg/dL (ref 8.4–10.5)
Creatinine, Ser: 0.65 mg/dL (ref 0.50–1.10)
GFR calc Af Amer: >90 mL/min (ref >90)
GFR calc non Af Amer: >90 mL/min (ref >90)
Glucose, Bld: 123 mg/dL — ABNORMAL HIGH (ref 70–99)
Potassium: 3.9 mEq/L (ref 3.7–5.3)
SODIUM: 140 meq/L (ref 137–147)

## 2014-06-15 MED ORDER — HYDROCODONE-ACETAMINOPHEN 5-325 MG PO TABS
1.0000 | ORAL_TABLET | Freq: Four times a day (QID) | ORAL | Status: DC | PRN
  Administered 2014-06-15: 1 via ORAL
  Filled 2014-06-15: qty 1

## 2014-06-15 MED ORDER — IBUPROFEN 800 MG PO TABS
800.0000 mg | ORAL_TABLET | Freq: Four times a day (QID) | ORAL | Status: DC | PRN
  Administered 2014-06-15 – 2014-06-16 (×3): 800 mg via ORAL
  Filled 2014-06-15 (×3): qty 1

## 2014-06-15 MED ORDER — OXYCODONE-ACETAMINOPHEN 5-325 MG PO TABS: 1.0000 | ORAL_TABLET | ORAL | Status: DC | PRN

## 2014-06-15 NOTE — Progress Notes (Signed)
No complaints.  Has not taken narcotics since last night.  Didn't like the way percocet made her feel.  No nausea.  Tolerating po.  Voiding without difficulty.  VSS.  AF. Labs unremarkable  Gen: A&O x 2 Abd: soft, ND.  Honeycomb dressing staining unchanged from last night Ext: no c/c/e  POD#1 s/p laparotomy -Change pain meds -Reassess for d/c this pm

## 2014-06-15 NOTE — Plan of Care (Signed)
Problem: Phase III Progression Outcomes Goal: Pain controlled on oral analgesia Outcome: Completed/Met Date Met:  06/15/14  Problem: Discharge Progression Outcomes Goal: Barriers To Progression Addressed/Resolved Outcome: Completed/Met Date Met:  06/15/14 Goal: Pain controlled with appropriate interventions Outcome: Completed/Met Date Met:  06/15/14 Goal: Hemodynamically stable Outcome: Completed/Met Date Met:  06/15/14 Goal: Tolerating diet Outcome: Completed/Met Date Met:  06/15/14

## 2014-06-15 NOTE — Plan of Care (Signed)
Problem: Phase II Progression Outcomes Goal: Incision/dressings dry and intact Outcome: Completed/Met Date Met:  06/15/14 Only small amount of old drainage on drsg.    Goal: Other Phase II Outcomes/Goals Outcome: Completed/Met Date Met:  06/15/14  Problem: Phase III Progression Outcomes Goal: Ambulates without assistance Outcome: Completed/Met Date Met:  06/15/14 Goal: Suprapubic cath with voiding trials Outcome: Not Applicable Date Met:  83/38/25

## 2014-06-15 NOTE — Progress Notes (Signed)
Patient having poor pain control with increased activity.  Will keep in-house with anticipated discharge tomorrow.  Improve pain control with change to percocet and ibuprofen.    Linda Hedges, DO

## 2014-06-16 MED ORDER — OXYCODONE-ACETAMINOPHEN 5-325 MG PO TABS: 1.0000 | ORAL_TABLET | ORAL | Status: DC | PRN

## 2014-06-16 MED ORDER — IBUPROFEN 800 MG PO TABS: 800.0000 mg | ORAL_TABLET | Freq: Four times a day (QID) | ORAL | Status: DC | PRN

## 2014-06-16 NOTE — Progress Notes (Signed)
Discharge instructions reviewed with patient.  Patient states understanding of home care, activity, medications, signs/symptoms to report to MD and return MD office visit.  Patients significant other will assist with her care @ home and no home equipment needed.  Patient ambulated for discharge in stable condition with staff without incident.

## 2014-06-16 NOTE — Progress Notes (Signed)
No complaints.  No narcotics taken since yesterday and reports pain 1-2/10.  No n/v.  Tolerating po.  Ambulating well.  Voiding without difficulty.    VSS.  AF. Gen: A&O x 3 Abd: soft, ND, inc c/d/i with 3cm of ecchymosis on right side of incision Ext: no c/c/e  POD#2 s/p laparotomy -D/C home with office f/u in 1 week

## 2014-06-16 NOTE — Discharge Summary (Signed)
Physician Discharge Summary  Patient ID: Megan May MRN: [id number] DOB/AGE: [birth year] 53 y.o.  Admit date: 06/14/2014 Discharge date: 06/16/2014  Admission Diagnoses:  Right adnexal mass  Discharge Diagnoses:  SAA Active Problems:   S/P laparotomy   Discharged Condition: good  Hospital Course: Admitted to preop for planned procedure of abdominal RSO and left salpingectomy.  This was completed without complication.  The patient did well postoperatively and was discharged home on pod#2 after meeting all goals.    Consults: None  Significant Diagnostic Studies: none  Treatments: surgery: Abdominal RSO, left salpingectomy  Discharge Exam: Blood pressure 163/69, pulse 54, temperature 98.3 F (36.8 C), temperature source Oral, resp. rate 16, height 5\' 2"  (1.575 m), weight 192 lb (87.091 kg), SpO2 99 %. General appearance: alert, cooperative and appears stated age GI: abdomen appropriately tender, non-distended Extremities: extremities normal, atraumatic, no cyanosis or edema Incision/Wound: c/d/i, ecchymosis on right side of incision; 3 cm  Disposition: 01-Home or Self Care     Medication List    TAKE these medications        albuterol 108 (90 BASE) MCG/ACT inhaler  Commonly known as:  PROVENTIL HFA;VENTOLIN HFA  Inhale 1-2 puffs into the lungs every 6 (six) hours as needed for wheezing or shortness of breath.     albuterol 108 (90 BASE) MCG/ACT inhaler  Commonly known as:  PROVENTIL HFA;VENTOLIN HFA  Inhale 2 puffs into the lungs 3 (three) times daily. As needed     ALPRAZolam 0.5 MG tablet  Commonly known as:  XANAX  Take 0.5 mg by mouth 2 (two) times daily as needed for anxiety.     Dexlansoprazole 30 MG capsule  Take 30 mg by mouth daily.     diltiazem 300 MG 24 hr capsule  Commonly known as:  TIAZAC  Take 300 mg by mouth daily.     diltiazem 360 MG 24 hr capsule  Commonly known as:  TIAZAC  Take one daily     eletriptan 40 MG tablet   Commonly known as:  RELPAX  Take one at onset of hadache.   may repeat in 2 hours one time only if necessary     ibuprofen 800 MG tablet  Commonly known as:  ADVIL,MOTRIN  Take 1 tablet (800 mg total) by mouth every 6 (six) hours as needed for moderate pain.     oxyCODONE-acetaminophen 5-325 MG per tablet  Commonly known as:  PERCOCET/ROXICET  Take 1-2 tablets by mouth every 4 (four) hours as needed for moderate pain.         SignedLinda Hedges 06/16/2014, 8:40 AM

## 2014-06-16 NOTE — Discharge Instructions (Signed)
Call MD for T>100.4, severe abdominal pain, intractable nausea and/or vomiting, or respiratory distress.  Call office to schedule postop incision check in 1 week.  No heavy lifting x 6 weeks.  No driving while taking narcotics.

## 2014-11-04 ENCOUNTER — Ambulatory Visit (INDEPENDENT_AMBULATORY_CARE_PROVIDER_SITE_OTHER): Payer: BC Managed Care – PPO | Admitting: Physician Assistant

## 2014-11-04 VITALS — BP 154/85 | HR 81 | Temp 98.6°F | Resp 12 | Ht 67.0 in | Wt 172.0 lb

## 2014-11-04 DIAGNOSIS — R5082 Postprocedural fever: Secondary | ICD-10-CM | POA: Diagnosis not present

## 2014-11-04 DIAGNOSIS — K0510 Chronic gingivitis, plaque induced: Secondary | ICD-10-CM

## 2014-11-04 DIAGNOSIS — K0889 Other specified disorders of teeth and supporting structures: Secondary | ICD-10-CM

## 2014-11-04 DIAGNOSIS — K088 Other specified disorders of teeth and supporting structures: Secondary | ICD-10-CM

## 2014-11-04 LAB — POCT CBC
Granulocyte percent: 77 %G (ref 37–80)
HCT, POC: 38.2 % (ref 37.7–47.9)
Hemoglobin: 12.9 g/dL (ref 12.2–16.2)
Lymph, poc: 1.6 (ref 0.6–3.4)
MCH, POC: 28.2 pg (ref 27–31.2)
MCHC: 33.8 g/dL (ref 31.8–35.4)
MCV: 83.4 fL (ref 80–97)
MID (cbc): 0.1 (ref 0–0.9)
MPV: 6.6 fL (ref 0–99.8)
POC GRANULOCYTE: 5.7 (ref 2–6.9)
POC LYMPH PERCENT: 21.2 %L (ref 10–50)
POC MID %: 1.8 % (ref 0–12)
Platelet Count, POC: 394 10*3/uL (ref 142–424)
RBC: 4.58 M/uL (ref 4.04–5.48)
RDW, POC: 12.4 %
WBC: 7.4 10*3/uL (ref 4.6–10.2)

## 2014-11-04 MED ORDER — LEVOFLOXACIN 500 MG PO TABS: 500.0000 mg | ORAL_TABLET | Freq: Every day | ORAL | Status: DC

## 2014-11-04 MED ORDER — IBUPROFEN 800 MG PO TABS: 800.0000 mg | ORAL_TABLET | Freq: Four times a day (QID) | ORAL | Status: DC | PRN

## 2014-11-04 NOTE — Patient Instructions (Signed)
Ice pack on face 15-20 min 3-4x daily.

## 2014-11-04 NOTE — Progress Notes (Signed)
Subjective:    Patient ID: Megan May, female    DOB: 1961-02-25, 54 y.o.   MRN: [id number]  HPI Patient presents for fever and facial pain post dental graft on upper right side on 10/26/14. Saw surgeon 2 days ago with complaints and was given a zpack. Facial and tooth pain is 2/10 and throbbing. Had temperature of 102 degrees last night and took ibuprofen and it resolved. Also, took ibuprofen at 1100 today. Denies increased facial swelling or redness. Denies ear pain, congestion, sinus pressure, or rhinorrhea.   Review of Systems As noted above.    Objective:   Physical Exam  Constitutional: She is oriented to person, place, and time. She appears well-developed and well-nourished. No distress.  Blood pressure 154/85, pulse 81, temperature 98.6 F (37 C), resp. rate 12, height 5\' 7"  (1.702 m), weight 172 lb (78.019 kg), last menstrual period 05/10/2014, SpO2 100 %.  HENT:  Head: Normocephalic and atraumatic.  Right Ear: Tympanic membrane, external ear and ear canal normal.  Left Ear: Tympanic membrane, external ear and ear canal normal.  Nose: Nose normal. Right sinus exhibits no maxillary sinus tenderness and no frontal sinus tenderness. Left sinus exhibits no maxillary sinus tenderness and no frontal sinus tenderness.  Mouth/Throat: Uvula is midline and mucous membranes are normal. She does not have dentures. No oral lesions. No trismus in the jaw. Abnormal dentition. Dental caries present. No dental abscesses, uvula swelling or lacerations. No oropharyngeal exudate, posterior oropharyngeal edema or posterior oropharyngeal erythema.    Eyes: Conjunctivae are normal. Pupils are equal, round, and reactive to light. Right eye exhibits no discharge. Left eye exhibits no discharge. No scleral icterus.  Neck: Normal range of motion. Neck supple. No thyromegaly present.  Cardiovascular: Normal rate, regular rhythm and normal heart sounds.  Exam reveals no gallop and no friction  rub.   No murmur heard. Pulmonary/Chest: Effort normal and breath sounds normal. No respiratory distress. She has no wheezes. She has no rales.  Abdominal: Soft.  Lymphadenopathy:    She has no cervical adenopathy.  Neurological: She is alert and oriented to person, place, and time.  Skin: Skin is warm and dry. No rash noted. She is not diaphoretic. No erythema. No pallor.   Results for orders placed or performed in visit on 11/04/14  POCT CBC  Result Value Ref Range   WBC 7.4 4.6 - 10.2 K/uL   Lymph, poc 1.6 0.6 - 3.4   POC LYMPH PERCENT 21.2 10 - 50 %L   MID (cbc) 0.1 0 - 0.9   POC MID % 1.8 0 - 12 %M   POC Granulocyte 5.7 2 - 6.9   Granulocyte percent 77.0 37 - 80 %G   RBC 4.58 4.04 - 5.48 M/uL   Hemoglobin 12.9 12.2 - 16.2 g/dL   HCT, POC 38.2 37.7 - 47.9 %   MCV 83.4 80 - 97 fL   MCH, POC 28.2 27 - 31.2 pg   MCHC 33.8 31.8 - 35.4 g/dL   RDW, POC 12.4 %   Platelet Count, POC 394 142 - 424 K/uL   MPV 6.6 0 - 99.8 fL       Assessment & Plan:  1. Postprocedural fever 2. Gum inflammation 3. Pain of tooth socket Ice pack on right side of face for 15-20 min 3-4x daily. - POCT CBC - levofloxacin (LEVAQUIN) 500 MG tablet; Take 1 tablet (500 mg total) by mouth daily.  Dispense: 7 tablet; Refill: 0 - ibuprofen (ADVIL,MOTRIN) 800  MG tablet; Take 1 tablet (800 mg total) by mouth every 6 (six) hours as needed for moderate pain.  Dispense: 30 tablet; Refill: 0    Aahna Rossa PA-C  [facility] 11/04/2014 2:21 PM

## 2014-11-06 ENCOUNTER — Ambulatory Visit: Payer: PRIVATE HEALTH INSURANCE | Admitting: Internal Medicine

## 2014-11-06 NOTE — Progress Notes (Signed)
  Medical screening examination/treatment/procedure(s) were performed by non-physician practitioner and as supervising physician I was immediately available for consultation/collaboration.     

## 2014-11-28 ENCOUNTER — Encounter: Payer: PRIVATE HEALTH INSURANCE | Admitting: Internal Medicine

## 2014-11-28 DIAGNOSIS — M79602 Pain in left arm: Secondary | ICD-10-CM

## 2014-11-28 NOTE — Progress Notes (Signed)
Subjective:    Patient ID: Megan May, female    DOB: 09/08/1960, 54 y.o.   MRN: [id number]  HPI  05/02/2014 note  Assessment & Plan:  HM: see scanned sheet Flu vaccine today Counseled lung cancer screening she is a non smoker  HTN Counseled it is very improtant to take her BP med daily   Chest pressure I advised cardiac stress testing but pt declines. Likely due to inadequately controlled BP Ekg today no acute changes  Chronic abd pain Improved   Anxiety continue meds   See me as needed        TODAY  Megan May is here for acute visi t   Allergies  Allergen Reactions  . Amoxicillin-Pot Clavulanate Rash  . Clindamycin/Lincomycin Itching  . Lactose Intolerance (Gi) Nausea Only  . Latex Rash  . Penicillins Swelling and Rash   Past Medical History  Diagnosis Date  . Hypertension   . Migraine   . Anxiety   . Eosinophilic esophagitis   . Asthma   . Posttraumatic stress disorder   . Generalized anxiety disorder   . Migraines   . Hypersomnia, recurrent   . Depression     history of    Past Surgical History  Procedure Laterality Date  . Tonsillectomy and adenoidectomy  1969  . Fracture surgery  1973    R arm  . Cesarean section  2004  . Laparotomy Bilateral 06/14/2014    Procedure: LAPAROTOMY with right  salpingo-oophorectomy and left salpingectomy;  Surgeon: Linda Hedges, DO;  Location: [facility];  Service: Gynecology;  Laterality: Bilateral;   History   Social History  . Marital Status: Married    Spouse Name: Randall Hiss  . Number of Children: 1  . Years of Education: 2 Masters   Occupational History  . SPECIAL EDUCATOR     Teacher, early years/pre   Social History Main Topics  . Smoking status: Never Smoker   . Smokeless tobacco: Never Used  . Alcohol Use: No  . Drug Use: No  . Sexual Activity: Yes    Birth Control/ Protection: Surgical   Other Topics Concern  . Not on file   Social History Narrative   Patient is right handed,  resides in home with husband and daughter. She consumes 16 oz of tea daily.   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   Family History  Problem Relation Age of Onset  . Hypertension Father   . Heart disease Father   . Diabetes Father   . Stroke Maternal Grandfather   . Stroke Maternal Grandmother   . Diabetes Paternal Grandmother    Patient Active Problem List   Diagnosis Date Noted  . S/P laparotomy 06/14/2014  . Diverticulosis of colon without hemorrhage  colonoscopy 2015 05/27/2014  . Colon polyp 05/02/2014  . Microscopic hematuria work up urology  see 01/2014 note 04/22/2014  . Hypersomnia, recurrent   . Dysarthria 05/01/2013  . Mastodynia 10/13/2012  . Mass of right breast 10/13/2012  . Essential hypertension, benign 06/29/2011  . Migraines 06/29/2011  . Anxiety 06/29/2011  . Eosinophilic esophagitis 38/75/6433  . Lupus anticoagulant positive 06/29/2011   Current Outpatient Prescriptions on File Prior to Visit  Medication Sig Dispense Refill  . albuterol (PROVENTIL HFA;VENTOLIN HFA) 108 (90 BASE) MCG/ACT inhaler Inhale 1-2 puffs into the lungs every 6 (six) hours as needed for wheezing or shortness of breath.    . ALPRAZolam (XANAX) 0.5 MG tablet Take 0.5 mg by mouth 2 (two) times  daily as needed for anxiety.     Marland Kitchen diltiazem (TIAZAC) 300 MG 24 hr capsule Take 300 mg by mouth daily.    Marland Kitchen eletriptan (RELPAX) 40 MG tablet Take one at onset of hadache.   may repeat in 2 hours one time only if necessary 10 tablet 0  . ibuprofen (ADVIL,MOTRIN) 800 MG tablet Take 1 tablet (800 mg total) by mouth every 6 (six) hours as needed for moderate pain. 30 tablet 0  . levofloxacin (LEVAQUIN) 500 MG tablet Take 1 tablet (500 mg total) by mouth daily. 7 tablet 0  . [DISCONTINUED] DULoxetine (CYMBALTA) 30 MG capsule Take 60 mg by mouth daily.     . [DISCONTINUED] topiramate (TOPAMAX) 25 MG capsule Take 75 mg by mouth daily.      No current facility-administered medications on file  prior to visit.      Review of Systems See hPI    Objective:   Physical Exam  Physical Exam  Nursing note and vitals reviewed.  Constitutional: She is oriented to person, place, and time. She appears well-developed and well-nourished.  HENT:  Head: Normocephalic and atraumatic.  Cardiovascular: Normal rate and regular rhythm. Exam reveals no gallop and no friction rub.  No murmur heard.  Pulmonary/Chest: Breath sounds normal. She has no wheezes. She has no rales.  Neurological: She is alert and oriented to person, place, and time.  Skin: Skin is warm and dry.  Psychiatric: She has a normal mood and affect. Her behavior is normal.             Assessment & Plan:

## 2015-02-26 ENCOUNTER — Telehealth: Payer: Self-pay | Admitting: Internal Medicine

## 2015-02-26 NOTE — Telephone Encounter (Signed)
Pt needs to know that I am moving to [place] office as of Fall

## 2015-02-26 NOTE — Telephone Encounter (Signed)
Relation to pt: self  Call back number: [phone]   Reason for call:  patient would like to establish care transfer from Dr. Coralyn Mark. Please advise

## 2015-02-27 NOTE — Telephone Encounter (Signed)
Pt decided to est with Dr. Charlett Blake

## 2015-03-08 ENCOUNTER — Ambulatory Visit (INDEPENDENT_AMBULATORY_CARE_PROVIDER_SITE_OTHER): Payer: BC Managed Care – PPO | Admitting: Family Medicine

## 2015-03-08 ENCOUNTER — Other Ambulatory Visit: Payer: Self-pay | Admitting: Family Medicine

## 2015-03-08 ENCOUNTER — Ambulatory Visit (HOSPITAL_BASED_OUTPATIENT_CLINIC_OR_DEPARTMENT_OTHER)
Admission: RE | Admit: 2015-03-08 | Discharge: 2015-03-08 | Disposition: A | Discharge status: Home / Self Care | Payer: BC Managed Care – PPO | Source: Ambulatory Visit | Attending: Family Medicine | Admitting: Family Medicine

## 2015-03-08 ENCOUNTER — Encounter: Payer: Self-pay | Admitting: Family Medicine

## 2015-03-08 VITALS — BP 130/92 | HR 62 | Temp 98.3°F | Ht 67.0 in | Wt 174.2 lb

## 2015-03-08 DIAGNOSIS — M7552 Bursitis of left shoulder: Secondary | ICD-10-CM

## 2015-03-08 DIAGNOSIS — M25551 Pain in right hip: Secondary | ICD-10-CM

## 2015-03-08 DIAGNOSIS — M79602 Pain in left arm: Secondary | ICD-10-CM | POA: Insufficient documentation

## 2015-03-08 DIAGNOSIS — R519 Headache, unspecified: Secondary | ICD-10-CM

## 2015-03-08 DIAGNOSIS — D72819 Decreased white blood cell count, unspecified: Secondary | ICD-10-CM

## 2015-03-08 DIAGNOSIS — M25512 Pain in left shoulder: Secondary | ICD-10-CM

## 2015-03-08 DIAGNOSIS — R51 Headache: Secondary | ICD-10-CM | POA: Diagnosis not present

## 2015-03-08 DIAGNOSIS — I10 Essential (primary) hypertension: Secondary | ICD-10-CM | POA: Diagnosis not present

## 2015-03-08 DIAGNOSIS — M7061 Trochanteric bursitis, right hip: Secondary | ICD-10-CM

## 2015-03-08 LAB — CBC
HEMATOCRIT: 39.2 % (ref 36.0–46.0)
Hemoglobin: 13.5 g/dL (ref 12.0–15.0)
MCHC: 34.5 g/dL (ref 30.0–36.0)
MCV: 85.3 fl (ref 78.0–100.0)
PLATELETS: 250 10*3/uL (ref 150.0–400.0)
RBC: 4.59 Mil/uL (ref 3.87–5.11)
RDW: 13.3 % (ref 11.5–15.5)
WBC: 10.2 10*3/uL (ref 4.0–10.5)

## 2015-03-08 LAB — COMPREHENSIVE METABOLIC PANEL
ALT: 9 U/L (ref 0–35)
AST: 12 U/L (ref 0–37)
Albumin: 4.1 g/dL (ref 3.5–5.2)
Alkaline Phosphatase: 84 U/L (ref 39–117)
BUN: 10 mg/dL (ref 6–23)
CO2: 26 mEq/L (ref 19–32)
Calcium: 9.4 mg/dL (ref 8.4–10.5)
Chloride: 106 mEq/L (ref 96–112)
Creatinine, Ser: 0.65 mg/dL (ref 0.40–1.20)
GFR: 101.26 mL/min (ref >60.00)
Glucose, Bld: 90 mg/dL (ref 70–99)
Potassium: 3.7 mEq/L (ref 3.5–5.1)
SODIUM: 138 meq/L (ref 135–145)
Total Bilirubin: 0.8 mg/dL (ref 0.2–1.2)
Total Protein: 6.7 g/dL (ref 6.0–8.3)

## 2015-03-08 LAB — SEDIMENTATION RATE: SED RATE: 12 mm/h (ref 0–22)

## 2015-03-08 NOTE — Progress Notes (Signed)
Pre visit review using our clinic review tool, if applicable. No additional management support is needed unless otherwise documented below in the visit note. 

## 2015-03-08 NOTE — Patient Instructions (Signed)
Probiotic daily, NOW company order online [email or web address][email or web address]. 10 strain cap 1 daily Vitamin Vitamin 2000 IU daily Calcium total between diet and supplements 1500 mg. Calcium Citrate is water soluble. Citracal (max of 600 mg per dose)   Tendinitis Tendinitis is swelling and inflammation of the tendons. Tendons are band-like tissues that connect muscle to bone. Tendinitis commonly occurs in the:   Shoulders (rotator cuff).  Heels (Achilles tendon).  Elbows (triceps tendon). CAUSES Tendinitis is usually caused by overusing the tendon, muscles, and joints involved. When the tissue surrounding a tendon (synovium) becomes inflamed, it is called tenosynovitis. Tendinitis commonly develops in people whose jobs require repetitive motions. SYMPTOMS  Pain.  Tenderness.  Mild swelling. DIAGNOSIS Tendinitis is usually diagnosed by physical exam. Your health care provider may also order X-rays or other imaging tests. TREATMENT Your health care provider may recommend certain medicines or exercises for your treatment. HOME CARE INSTRUCTIONS   Use a sling or splint for as long as directed by your health care provider until the pain decreases.  Put ice on the injured area.  Put ice in a plastic bag.  Place a towel between your skin and the bag.  Leave the ice on for 15-20 minutes, 3-4 times a day, or as directed by your health care provider.  Avoid using the limb while the tendon is painful. Perform gentle range of motion exercises only as directed by your health care provider. Stop exercises if pain or discomfort increase, unless directed otherwise by your health care provider.  Only take over-the-counter or prescription medicines for pain, discomfort, or fever as directed by your health care provider. SEEK MEDICAL CARE IF:   Your pain and swelling increase.  You develop new, unexplained symptoms, especially increased numbness in the hands. MAKE SURE YOU:   Understand these  instructions.  Will watch your condition.  Will get help right away if you are not doing well or get worse. Document Released: 07/17/2000 Document Revised: 12/04/2013 Document Reviewed: 10/06/2010 [facility] Patient Information 2015 [place], [place]. This information is not intended to replace advice given to you by your health care provider. Make sure you discuss any questions you have with your health care provider.

## 2015-03-22 ENCOUNTER — Encounter: Payer: Self-pay | Admitting: Family Medicine

## 2015-03-22 ENCOUNTER — Other Ambulatory Visit (INDEPENDENT_AMBULATORY_CARE_PROVIDER_SITE_OTHER): Payer: BC Managed Care – PPO

## 2015-03-22 ENCOUNTER — Ambulatory Visit (INDEPENDENT_AMBULATORY_CARE_PROVIDER_SITE_OTHER): Payer: BC Managed Care – PPO | Admitting: Family Medicine

## 2015-03-22 VITALS — BP 130/84 | HR 60 | Ht 67.0 in | Wt 172.0 lb

## 2015-03-22 DIAGNOSIS — M25512 Pain in left shoulder: Secondary | ICD-10-CM | POA: Diagnosis not present

## 2015-03-22 DIAGNOSIS — M7061 Trochanteric bursitis, right hip: Secondary | ICD-10-CM

## 2015-03-22 DIAGNOSIS — M7552 Bursitis of left shoulder: Secondary | ICD-10-CM | POA: Diagnosis not present

## 2015-03-22 MED ORDER — DICLOFENAC SODIUM 2 % TD SOLN: TRANSDERMAL | Status: DC

## 2015-03-22 NOTE — Progress Notes (Signed)
[facility] [address], [place] [address] Phone: [phone] Subjective:    I'm seeing this patient by the request  of:  SCHOENHOFF,DEBBIE, MD Willette Alma MD.   CC: Right hip pain and left shoulder pain  OYD:XAJOINOMVE Megan May is a 54 y.o. female coming in with complaint of left shoulder pain. Patient states that she has had this pain for multiple months. States that certain activities seem to be more difficult. Denies any loss of range of motion. States that only seemed to start happening after she took antibiotically. Patient denies any significant radiation down the arm but seems that the arm hurts in general. Denies any fever, chills, any abnormal weight loss. Rates the severity of pain as 5 out of 10. Patient states that it can wake her up at night. Not responding over-the-counter medications.  Complaint right hip pain. Describes pain as more of a dull aching sensation that is intermittent. Not stopping her from any activities. Worse after sitting for long amount of time. Sometimes when she rolls over at night can be uncomfortable. Denies any radiation. Denies any weakness. States that this is not as severe as her arm. Rate the severity of 2 out of 10.  Past Medical History  Diagnosis Date  . Hypertension   . Migraine   . Anxiety   . Eosinophilic esophagitis   . Asthma   . Posttraumatic stress disorder   . Generalized anxiety disorder   . Migraines   . Hypersomnia, recurrent   . Depression     history of    Past Surgical History  Procedure Laterality Date  . Tonsillectomy and adenoidectomy  1969  . Fracture surgery  1973    R arm  . Cesarean section  2004  . Laparotomy Bilateral 06/14/2014    Procedure: LAPAROTOMY with right  salpingo-oophorectomy and left salpingectomy;  Surgeon: Linda Hedges, DO;  Location: [facility];  Service: Gynecology;  Laterality: Bilateral;   Social History  Substance Use Topics  . Smoking  status: Never Smoker   . Smokeless tobacco: Never Used  . Alcohol Use: No   Allergies  Allergen Reactions  . Amoxicillin-Pot Clavulanate Rash  . Clindamycin/Lincomycin Itching  . Lactose Intolerance (Gi) Nausea Only  . Latex Rash  . Penicillins Swelling and Rash   Family History  Problem Relation Age of Onset  . Hypertension Father   . Heart disease Father   . Diabetes Father   . Stroke Maternal Grandfather   . Stroke Maternal Grandmother   . Diabetes Paternal Grandmother         Past medical history, social, surgical and family history all reviewed in electronic medical record.   Review of Systems: No headache, visual changes, nausea, vomiting, diarrhea, constipation, dizziness, abdominal pain, skin rash, fevers, chills, night sweats, weight loss, swollen lymph nodes, body aches, joint swelling, muscle aches, chest pain, shortness of breath, mood changes.   Objective Blood pressure 130/84, pulse 60, height 5\' 7"  (1.702 m), weight 172 lb (78.019 kg), last menstrual period 05/10/2014, SpO2 98 %.  General: No apparent distress alert and oriented x3 mood and affect normal, dressed appropriately.  HEENT: Pupils equal, extraocular movements intact  Respiratory: Patient's speak in full sentences and does not appear short of breath  Cardiovascular: No lower extremity edema, non tender, no erythema  Skin: Warm dry intact with no signs of infection or rash on extremities or on axial skeleton.  Abdomen: Soft nontender  Neuro: Cranial nerves II  through XII are intact, neurovascularly intact in all extremities with 2+ DTRs and 2+ pulses.  Lymph: No lymphadenopathy of posterior or anterior cervical chain or axillae bilaterally.  Gait normal with good balance and coordination.  MSK:  Non tender with full range of motion and good stability and symmetric strength and tone of shoulders, elbows, wrist, , knee and ankles bilaterally.  Hip: Right ROM IR: 25 Deg, ER: 45 Deg, Flexion: 120 Deg,  Extension: 100 Deg, Abduction: 45 Deg, Adduction: 45 Deg Strength IR: 5/5, ER: 5/5, Flexion: 5/5, Extension: 5/5, Abduction: 5/5, Adduction: 5/5 Pelvic alignment unremarkable to inspection and palpation. Standing hip rotation and gait without trendelenburg sign / unsteadiness. Greater trochanter with moderate tenderness No tenderness over piriformis and greater trochanter. Positive Faber No SI joint tenderness and normal minimal SI movement. Contralateral hip unremarkable Shoulder: left Inspection reveals no abnormalities, atrophy or asymmetry. Palpation is normal with no tenderness over AC joint or bicipital groove. ROM is full in all planes passively. Rotator cuff strength normal throughout. signs of impingement with positive Neer and Hawkin's tests, but negative empty can sign. Speeds and Yergason's tests normal. No labral pathology noted with negative Obrien's, negative clunk and good stability. Normal scapular function observed. No painful arc and no drop arm sign. No apprehension sign Contralateral shoulder unremarkable  MSK US performed of: left This study was ordered, performed, and interpreted by Charlann Boxer D.O.  Shoulder:   Supraspinatus:  Appears normal on long and transverse views, Bursal bulge seen with shoulder abduction on impingement view. Mild calcific changes noted as well. Infraspinatus:  Appears normal on long and transverse views. Significant increase in Doppler flow Subscapularis:  Appears normal on long and transverse views. Positive bursa Teres Minor:  Appears normal on long and transverse views. AC joint:  Capsule undistended, no geyser sign. Minimal arthritis Glenohumeral Joint:  Appears normal without effusion. Glenoid Labrum:  Intact without visualized tears. Biceps Tendon:  Appears normal on long and transverse views, no fraying of tendon, tendon located in intertubercular groove, no subluxation with shoulder internal or external rotation.  Impression:  Subacromial bursitis  Procedure note 50539; 15 minutes spent for Therapeutic exercises as stated in above notes.  This included exercises focusing on stretching, strengthening, with significant focus on eccentric aspects.  Shoulder Exercises that included:  Basic scapular stabilization to include adduction and depression of scapula Scaption, focusing on proper movement and good control Internal and External rotation utilizing a theraband, with elbow tucked at side entire time Rows with theraband Proper technique shown and discussed handout in great detail with ATC.  All questions were discussed and answered.     Impression and Recommendations:     This case required medical decision making of moderate complexity.

## 2015-03-22 NOTE — Assessment & Plan Note (Signed)
Do believe the patient's pain is likely secondary to more of a bursitis but could also be contribute into a rotator cuff tendinopathy. Patient is very aware of the possible side effect of the fluoroquinolone that she was on and thinks that this is the culprit. Discussed with patient though that it would likely go away on its own. We discussed topical anti-inflammatory's in prescriptive given. We discussed icing regimen. We discussed home exercises and patient did work with Product/process development scientist today. Patient is going to try to make these different changes and come back and see me again in 3-4 weeks. Differential includes frozen shoulder. We will monitor. If any worsening symptoms she did respond well to a cortical Sterritt injection and possibly formal physical therapy.

## 2015-03-22 NOTE — Patient Instructions (Signed)
Nice to meet you Ice your shoulder and hip atleast 2x/day, after activity and before bed Rehab exercises 3x/week Pennsaid, topical anti-inflammatory, called [facility]  Limit overhead activity when you can Take sample Meloxican 1pill for 5days  Can try Tumeric 500units 2x/day to help with inflammation  See me again in 3 weeks if not perfect or hip still hurts.

## 2015-03-22 NOTE — Assessment & Plan Note (Signed)
Visual try topical anti-phlegm toes, home exercise, icing protocol. If no significant improvement injection could be necessary.

## 2015-03-22 NOTE — Progress Notes (Signed)
Pre visit review using our clinic review tool, if applicable. No additional management support is needed unless otherwise documented below in the visit note. 

## 2015-03-24 ENCOUNTER — Encounter: Payer: Self-pay | Admitting: Family Medicine

## 2015-03-24 DIAGNOSIS — M25551 Pain in right hip: Secondary | ICD-10-CM | POA: Insufficient documentation

## 2015-03-24 DIAGNOSIS — D72819 Decreased white blood cell count, unspecified: Secondary | ICD-10-CM | POA: Insufficient documentation

## 2015-03-24 NOTE — Assessment & Plan Note (Signed)
Encouraged antiinflammtories and referred to sports med for further intervention

## 2015-03-24 NOTE — Assessment & Plan Note (Signed)
Well controlled, no changes to meds. Encouraged heart healthy diet such as the DASH diet and exercise as tolerated.  °

## 2015-03-24 NOTE — Progress Notes (Signed)
Subjective:    Patient ID: Megan May, female    DOB: 10-Sep-1960, 54 y.o.   MRN: [id number]  Chief Complaint  Patient presents with  . Allergic Reaction    antibiotic    HPI Patient is in today for evaluation of numerous concerns. She is new to the practice. She has been treated numerous times for sinusitis recently. She developed hip and shoulder pain after a course of Levaquin and is concerned she has developed a case of tendonopathy as a result of use. Hip hurts worse with crouching or squatting, hurtws worse at night. Is recovering from bone graft surgery as well. Is noting some persistent facial pressure most notable on right after treatment but denies fevers/chills, rhinorrhea. Denies CP/palp/SOB/fevers/GI or GU c/o. Taking meds as prescribed  Past Medical History  Diagnosis Date  . Hypertension   . Migraine   . Anxiety   . Eosinophilic esophagitis   . Asthma   . Posttraumatic stress disorder   . Generalized anxiety disorder   . Migraines   . Hypersomnia, recurrent   . Depression     history of     Past Surgical History  Procedure Laterality Date  . Tonsillectomy and adenoidectomy  1969  . Fracture surgery  1973    R arm  . Cesarean section  2004  . Laparotomy Bilateral 06/14/2014    Procedure: LAPAROTOMY with right  salpingo-oophorectomy and left salpingectomy;  Surgeon: Linda Hedges, DO;  Location: [facility];  Service: Gynecology;  Laterality: Bilateral;    Family History  Problem Relation Age of Onset  . Hypertension Father   . Heart disease Father   . Diabetes Father   . Stroke Maternal Grandfather   . Stroke Maternal Grandmother   . Diabetes Paternal Grandmother     Social History   Social History  . Marital Status: Married    Spouse Name: Randall Hiss  . Number of Children: 1  . Years of Education: 2 Masters   Occupational History  . SPECIAL EDUCATOR     Teacher, early years/pre   Social History Main Topics  . Smoking status: Never Smoker   .  Smokeless tobacco: Never Used  . Alcohol Use: No  . Drug Use: No  . Sexual Activity: Yes    Birth Control/ Protection: Surgical   Other Topics Concern  . Not on file   Social History Narrative   Patient is right handed, resides in home with husband and daughter. She consumes 16 oz of tea daily.   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    Outpatient Prescriptions Prior to Visit  Medication Sig Dispense Refill  . albuterol (PROVENTIL HFA;VENTOLIN HFA) 108 (90 BASE) MCG/ACT inhaler Inhale 1-2 puffs into the lungs every 6 (six) hours as needed for wheezing or shortness of breath.    . ALPRAZolam (XANAX) 0.5 MG tablet Take 0.5 mg by mouth 2 (two) times daily as needed for anxiety.     Marland Kitchen diltiazem (TIAZAC) 300 MG 24 hr capsule Take 300 mg by mouth daily.    Marland Kitchen eletriptan (RELPAX) 40 MG tablet Take one at onset of hadache.   may repeat in 2 hours one time only if necessary 10 tablet 0  . ibuprofen (ADVIL,MOTRIN) 800 MG tablet Take 1 tablet (800 mg total) by mouth every 6 (six) hours as needed for moderate pain. 30 tablet 0  . levofloxacin (LEVAQUIN) 500 MG tablet Take 1 tablet (500 mg total) by mouth daily. 7 tablet 0  No facility-administered medications prior to visit.    Allergies  Allergen Reactions  . Amoxicillin-Pot Clavulanate Rash  . Levaquin [Levofloxacin In D5w] Other (See Comments)    tendonopathy  . Clindamycin/Lincomycin Itching  . Lactose Intolerance (Gi) Nausea Only  . Latex Rash  . Penicillins Swelling and Rash    Review of Systems  Constitutional: Positive for malaise/fatigue. Negative for fever.  HENT: Positive for congestion.   Eyes: Negative for discharge.  Respiratory: Negative for shortness of breath.   Cardiovascular: Negative for chest pain, palpitations and leg swelling.  Gastrointestinal: Negative for nausea and abdominal pain.  Genitourinary: Negative for dysuria.  Musculoskeletal: Positive for myalgias and joint pain. Negative for falls.    Skin: Negative for rash.  Neurological: Positive for headaches. Negative for loss of consciousness.  Endo/Heme/Allergies: Negative for environmental allergies.  Psychiatric/Behavioral: Negative for depression. The patient is not nervous/anxious.        Objective:    Physical Exam  Constitutional: She is oriented to person, place, and time. She appears well-developed and well-nourished. No distress.  HENT:  Head: Normocephalic and atraumatic.  Nose: Nose normal.  Eyes: Right eye exhibits no discharge. Left eye exhibits no discharge.  Neck: Normal range of motion. Neck supple.  Cardiovascular: Normal rate and regular rhythm.   No murmur heard. Pulmonary/Chest: Effort normal and breath sounds normal.  Abdominal: Soft. Bowel sounds are normal. There is no tenderness.  Musculoskeletal: She exhibits no edema.  Neurological: She is alert and oriented to person, place, and time.  Skin: Skin is warm and dry.  Psychiatric: She has a normal mood and affect.  Nursing note and vitals reviewed.   BP 130/92 mmHg  Pulse 62  Temp(Src) 98.3 F (36.8 C) (Oral)  Ht 5' 7" (1.702 m)  Wt 174 lb 4 oz (79.039 kg)  BMI 27.28 kg/m2  SpO2 98%  LMP 05/10/2014 (Approximate) Wt Readings from Last 3 Encounters:  03/22/15 172 lb (78.019 kg)  03/08/15 174 lb 4 oz (79.039 kg)  11/04/14 172 lb (78.019 kg)     Lab Results  Component Value Date   WBC 10.2 03/08/2015   HGB 13.5 03/08/2015   HCT 39.2 03/08/2015   PLT 250.0 03/08/2015   GLUCOSE 90 03/08/2015   CHOL 214* 11/27/2013   TRIG 140 11/27/2013   HDL 61 11/27/2013   LDLCALC 125* 11/27/2013   ALT 9 03/08/2015   AST 12 03/08/2015   NA 138 03/08/2015   K 3.7 03/08/2015   CL 106 03/08/2015   CREATININE 0.65 03/08/2015   BUN 10 03/08/2015   CO2 26 03/08/2015   TSH 2.582 11/27/2013    Lab Results  Component Value Date   TSH 2.582 11/27/2013   Lab Results  Component Value Date   WBC 10.2 03/08/2015   HGB 13.5 03/08/2015   HCT 39.2  03/08/2015   MCV 85.3 03/08/2015   PLT 250.0 03/08/2015   Lab Results  Component Value Date   NA 138 03/08/2015   K 3.7 03/08/2015   CO2 26 03/08/2015   GLUCOSE 90 03/08/2015   BUN 10 03/08/2015   CREATININE 0.65 03/08/2015   BILITOT 0.8 03/08/2015   ALKPHOS 84 03/08/2015   AST 12 03/08/2015   ALT 9 03/08/2015   PROT 6.7 03/08/2015   ALBUMIN 4.1 03/08/2015   CALCIUM 9.4 03/08/2015   ANIONGAP 11 06/15/2014   GFR 101.26 03/08/2015   Lab Results  Component Value Date   CHOL 214* 11/27/2013   Lab Results  Component Value  Date   HDL 61 11/27/2013   Lab Results  Component Value Date   LDLCALC 125* 11/27/2013   Lab Results  Component Value Date   TRIG 140 11/27/2013   Lab Results  Component Value Date   CHOLHDL 3.5 11/27/2013   No results found for: HGBA1C     Assessment & Plan:   Problem List Items Addressed This Visit    Right hip pain   Relevant Orders   DG HIP UNILAT WITH PELVIS 2-3 VIEWS RIGHT (Completed)   Ambulatory referral to Sports Medicine   Leukocytopenia   Relevant Orders   Comprehensive metabolic panel (Completed)   CBC (Completed)   Greater trochanteric bursitis of right hip    Encouraged antiinflammtories and referred to sports med for further intervention      Essential hypertension, benign - Primary    Well controlled, no changes to meds. Encouraged heart healthy diet such as the DASH diet and exercise as tolerated.       Relevant Orders   Sed Rate (ESR) (Completed)   Comprehensive metabolic panel (Completed)   CBC (Completed)   Bursitis of left shoulder    Patient has been referred to sports med. Encouraged topical treatments, antiinflammatories. Consider possiblity that tendonopathy from Levaquin is contributing to pain.       Other Visit Diagnoses    Face pain        Relevant Orders    Sed Rate (ESR) (Completed)    Pain in joint, shoulder region, left           I have discontinued Ms. Malone-Birnbach's levofloxacin and  ibuprofen. I am also having her maintain her ALPRAZolam, eletriptan, albuterol, and diltiazem.  No orders of the defined types were placed in this encounter.     Penni Homans, MD

## 2015-03-24 NOTE — Assessment & Plan Note (Signed)
Patient has been referred to sports med. Encouraged topical treatments, antiinflammatories. Consider possiblity that tendonopathy from Levaquin is contributing to pain.

## 2015-04-03 ENCOUNTER — Ambulatory Visit: Payer: BC Managed Care – PPO | Admitting: Family Medicine

## 2015-04-23 ENCOUNTER — Ambulatory Visit: Payer: BC Managed Care – PPO | Admitting: Family Medicine

## 2015-09-17 ENCOUNTER — Ambulatory Visit: Payer: PRIVATE HEALTH INSURANCE | Admitting: Family Medicine

## 2015-12-31 ENCOUNTER — Telehealth: Payer: Self-pay | Admitting: Internal Medicine

## 2015-12-31 MED ORDER — DILTIAZEM HCL ER BEADS 300 MG PO CP24: 300.0000 mg | ORAL_CAPSULE | Freq: Every day | ORAL | Status: DC

## 2015-12-31 NOTE — Telephone Encounter (Signed)
[employer] fax to clarify

## 2015-12-31 NOTE — Telephone Encounter (Signed)
Refill done.  

## 2015-12-31 NOTE — Telephone Encounter (Signed)
Relation to [id number] Call back [phone] Pharmacy: [facility], [place]. [phone] (Phone) [phone] (Fax)         Reason for call:  Patient requesting a refill diltiazem (TIAZAC) 300 MG 24 hr capsule

## 2015-12-31 NOTE — Telephone Encounter (Signed)
[facility] to clarify patient historical has been taking Cardizem CD 300 mg.  PCP stated ok to keep her on this. Updated medication list.

## 2015-12-31 NOTE — Addendum Note (Signed)
Addended by: Sharon Seller B on: 12/31/2015 04:49 PM   Modules accepted: Orders, Medications

## 2016-01-04 ENCOUNTER — Ambulatory Visit (INDEPENDENT_AMBULATORY_CARE_PROVIDER_SITE_OTHER): Payer: BC Managed Care – PPO | Admitting: Physician Assistant

## 2016-01-04 ENCOUNTER — Emergency Department (HOSPITAL_COMMUNITY): Payer: BC Managed Care – PPO

## 2016-01-04 ENCOUNTER — Emergency Department (HOSPITAL_COMMUNITY)
Admission: EM | Admit: 2016-01-04 | Discharge: 2016-01-04 | Disposition: A | Discharge status: Home / Self Care | Payer: BC Managed Care – PPO | Attending: Emergency Medicine | Admitting: Emergency Medicine

## 2016-01-04 ENCOUNTER — Encounter (HOSPITAL_COMMUNITY): Payer: Self-pay

## 2016-01-04 VITALS — BP 194/104 | HR 63 | Temp 98.2°F | Resp 16 | Ht 67.0 in | Wt 181.4 lb

## 2016-01-04 DIAGNOSIS — R0789 Other chest pain: Secondary | ICD-10-CM | POA: Insufficient documentation

## 2016-01-04 DIAGNOSIS — F439 Reaction to severe stress, unspecified: Secondary | ICD-10-CM | POA: Insufficient documentation

## 2016-01-04 DIAGNOSIS — R079 Chest pain, unspecified: Secondary | ICD-10-CM | POA: Diagnosis present

## 2016-01-04 DIAGNOSIS — Z91040 Latex allergy status: Secondary | ICD-10-CM | POA: Diagnosis not present

## 2016-01-04 DIAGNOSIS — I10 Essential (primary) hypertension: Secondary | ICD-10-CM | POA: Diagnosis not present

## 2016-01-04 DIAGNOSIS — J45909 Unspecified asthma, uncomplicated: Secondary | ICD-10-CM | POA: Diagnosis not present

## 2016-01-04 DIAGNOSIS — I160 Hypertensive urgency: Secondary | ICD-10-CM | POA: Diagnosis not present

## 2016-01-04 DIAGNOSIS — F419 Anxiety disorder, unspecified: Secondary | ICD-10-CM

## 2016-01-04 LAB — CBC
HEMATOCRIT: 42.1 % (ref 36.0–46.0)
HEMOGLOBIN: 14.5 g/dL (ref 12.0–15.0)
MCH: 28.3 pg (ref 26.0–34.0)
MCHC: 34.4 g/dL (ref 30.0–36.0)
MCV: 82.1 fL (ref 78.0–100.0)
Platelets: 219 10*3/uL (ref 150–400)
RBC: 5.13 MIL/uL — AB (ref 3.87–5.11)
RDW: 12.7 % (ref 11.5–15.5)
WBC: 7.2 10*3/uL (ref 4.0–10.5)

## 2016-01-04 LAB — BASIC METABOLIC PANEL
ANION GAP: 8 (ref 5–15)
BUN: 11 mg/dL (ref 6–20)
CALCIUM: 10 mg/dL (ref 8.9–10.3)
CHLORIDE: 107 mmol/L (ref 101–111)
CO2: 25 mmol/L (ref 22–32)
Creatinine, Ser: 0.68 mg/dL (ref 0.44–1.00)
GFR calc non Af Amer: >60 mL/min (ref >60)
GLUCOSE: 93 mg/dL (ref 65–99)
POTASSIUM: 3.5 mmol/L (ref 3.5–5.1)
Sodium: 140 mmol/L (ref 135–145)

## 2016-01-04 LAB — I-STAT TROPONIN, ED
Troponin i, poc: 0.01 ng/mL (ref 0.00–0.08)
Troponin i, poc: 0 ng/mL (ref 0.00–0.08)

## 2016-01-04 NOTE — ED Notes (Signed)
Family at bedside. 

## 2016-01-04 NOTE — Discharge Instructions (Signed)
Continue taking your blood pressure medication as directed. Check your blood pressures daily, once in the morning and once in the evening, and keep a log of the numbers to bring to your next doctor's appointment. Always make sure you've been seated for at least 15 minutes and not moved around or exerted yourself in any way prior to checking your blood pressure. Avoid caffeine intake or any medications that have caffeine, or sinus congestion medications like sudafed, as these can increase your blood pressure. Decrease the salt in your diet. You may want to try using your home xanax as needed for anxiety/stress or during the episodes of pain to see if it helps. You can also consider taking tylenol/motrin for pain, or using heat over the affected area to see if it helps with your symptoms. Follow up with your regular doctor in 1 week for recheck of symptoms and ongoing management of your blood pressure. Return to the ER for changes or worsening symptoms.   Nonspecific Chest Pain It is often hard to find the cause of chest pain. There is always a chance that your pain could be related to something serious, such as a heart attack or a blood clot in your lungs. Chest pain can also be caused by conditions that are not life-threatening. If you have chest pain, it is very important to follow up with your doctor.  HOME CARE  If you were prescribed an antibiotic medicine, finish it all even if you start to feel better.  Avoid any activities that cause chest pain.  Do not use any tobacco products, including cigarettes, chewing tobacco, or electronic cigarettes. If you need help quitting, ask your doctor.  Do not drink alcohol.  Take medicines only as told by your doctor.  Keep all follow-up visits as told by your doctor. This is important. This includes any further testing if your chest pain does not go away.  Your doctor may tell you to keep your head raised (elevated) while you sleep.  Make lifestyle  changes as told by your doctor. These may include:  Getting regular exercise. Ask your doctor to suggest some activities that are safe for you.  Eating a heart-healthy diet. Your doctor or a diet specialist (dietitian) can help you to learn healthy eating options.  Maintaining a healthy weight.  Managing diabetes, if necessary.  Reducing stress. GET HELP IF:  Your chest pain does not go away, even after treatment.  You have a rash with blisters on your chest.  You have a fever. GET HELP RIGHT AWAY IF:  Your chest pain is worse.  You have an increasing cough, or you cough up blood.  You have severe belly (abdominal) pain.  You feel extremely weak.  You pass out (faint).  You have chills.  You have sudden, unexplained chest discomfort.  You have sudden, unexplained discomfort in your arms, back, neck, or jaw.  You have shortness of breath at any time.  You suddenly start to sweat, or your skin gets clammy.  You feel nauseous.  You vomit.  You suddenly feel light-headed or dizzy.  Your heart begins to beat quickly, or it feels like it is skipping beats. These symptoms may be an emergency. Do not wait to see if the symptoms will go away. Get medical help right away. Call your local emergency services (911 in the [place] Do not drive yourself to the hospital.   This information is not intended to replace advice given to you by your health care  provider. Make sure you discuss any questions you have with your health care provider.   Document Released: 01/06/2008 Document Revised: 08/10/2014 Document Reviewed: 02/23/2014 Elsevier Interactive Patient Education 2016 [employer].  Hypertension Hypertension is another name for high blood pressure. High blood pressure forces your heart to work harder to pump blood. A blood pressure reading has two numbers, which includes a higher number over a lower number (example: 110/72). HOME CARE   Have your blood pressure rechecked  by your doctor.  Only take medicine as told by your doctor. Follow the directions carefully. The medicine does not work as well if you skip doses. Skipping doses also puts you at risk for problems.  Do not smoke.  Monitor your blood pressure at home as told by your doctor. GET HELP IF:  You think you are having a reaction to the medicine you are taking.  You have repeat headaches or feel dizzy.  You have puffiness (swelling) in your ankles.  You have trouble with your vision. GET HELP RIGHT AWAY IF:   You get a very bad headache and are confused.  You feel weak, numb, or faint.  You get chest or belly (abdominal) pain.  You throw up (vomit).  You cannot breathe very well. MAKE SURE YOU:   Understand these instructions.  Will watch your condition.  Will get help right away if you are not doing well or get worse.   This information is not intended to replace advice given to you by your health care provider. Make sure you discuss any questions you have with your health care provider.   Document Released: 01/06/2008 Document Revised: 07/25/2013 Document Reviewed: 05/12/2013 Elsevier Interactive Patient Education 2016 [place] Your High Blood Pressure Blood pressure is a measurement of how forceful your blood is pressing against the walls of the arteries. Arteries are muscular tubes within the circulatory system. Blood pressure does not stay the same. Blood pressure rises when you are active, excited, or nervous; and it lowers during sleep and relaxation. If the numbers measuring your blood pressure stay above normal most of the time, you are at risk for health problems. High blood pressure (hypertension) is a long-term (chronic) condition in which blood pressure is elevated. A blood pressure reading is recorded as two numbers, such as 120 over 80 (or 120/80). The first, higher number is called the systolic pressure. It is a measure of the pressure in your  arteries as the heart beats. The second, lower number is called the diastolic pressure. It is a measure of the pressure in your arteries as the heart relaxes between beats.  Keeping your blood pressure in a normal range is important to your overall health and prevention of health problems, such as heart disease and stroke. When your blood pressure is uncontrolled, your heart has to work harder than normal. High blood pressure is a very common condition in adults because blood pressure tends to rise with age. Men and women are equally likely to have hypertension but at different times in life. Before age 29, men are more likely to have hypertension. After 54 years of age, women are more likely to have it. Hypertension is especially common in African Americans. This condition often has no signs or symptoms. The cause of the condition is usually not known. Your caregiver can help you come up with a plan to keep your blood pressure in a normal, healthy range. BLOOD PRESSURE STAGES Blood pressure is classified into four stages: normal,  prehypertension, stage 1, and stage 2. Your blood pressure reading will be used to determine what type of treatment, if any, is necessary. Appropriate treatment options are tied to these four stages:  Normal  Systolic pressure (mm Hg): below 120.  Diastolic pressure (mm Hg): below 80. Prehypertension  Systolic pressure (mm Hg): 120 to 139.  Diastolic pressure (mm Hg): 80 to 89. Stage1  Systolic pressure (mm Hg): 140 to 159.  Diastolic pressure (mm Hg): 90 to 99. Stage2  Systolic pressure (mm Hg): 160 or above.  Diastolic pressure (mm Hg): 100 or above. RISKS RELATED TO HIGH BLOOD PRESSURE Managing your blood pressure is an important responsibility. Uncontrolled high blood pressure can lead to:  A heart attack.  A stroke.  A weakened blood vessel (aneurysm).  Heart failure.  Kidney damage.  Eye damage.  Metabolic syndrome.  Memory and concentration  problems. HOW TO MANAGE YOUR BLOOD PRESSURE Blood pressure can be managed effectively with lifestyle changes and medicines (if needed). Your caregiver will help you come up with a plan to bring your blood pressure within a normal range. Your plan should include the following: Education  Read all information provided by your caregivers about how to control blood pressure.  Educate yourself on the latest guidelines and treatment recommendations. New research is always being done to further define the risks and treatments for high blood pressure. Lifestylechanges  Control your weight.  Avoid smoking.  Stay physically active.  Reduce the amount of salt in your diet.  Reduce stress.  Control any chronic conditions, such as high cholesterol or diabetes.  Reduce your alcohol intake. Medicines  Several medicines (antihypertensive medicines) are available, if needed, to bring blood pressure within a normal range. Communication  Review all the medicines you take with your caregiver because there may be side effects or interactions.  Talk with your caregiver about your diet, exercise habits, and other lifestyle factors that may be contributing to high blood pressure.  See your caregiver regularly. Your caregiver can help you create and adjust your plan for managing high blood pressure. RECOMMENDATIONS FOR TREATMENT AND FOLLOW-UP  The following recommendations are based on current guidelines for managing high blood pressure in nonpregnant adults. Use these recommendations to identify the proper follow-up period or treatment option based on your blood pressure reading. You can discuss these options with your caregiver.  Systolic pressure of 671 to 245 or diastolic pressure of 80 to 89: Follow up with your caregiver as directed.  Systolic pressure of 809 to 983 or diastolic pressure of 90 to 100: Follow up with your caregiver within 2 months.  Systolic pressure above 382 or diastolic  pressure above 505: Follow up with your caregiver within 1 month.  Systolic pressure above 397 or diastolic pressure above 673: Consider antihypertensive therapy; follow up with your caregiver within 1 week.  Systolic pressure above 419 or diastolic pressure above 379: Begin antihypertensive therapy; follow up with your caregiver within 1 week.   This information is not intended to replace advice given to you by your health care provider. Make sure you discuss any questions you have with your health care provider.   Document Released: 04/13/2012 Document Reviewed: 04/13/2012 Elsevier Interactive Patient Education [employer].  How to Take Your Blood Pressure HOW DO I GET A BLOOD PRESSURE MACHINE?  You can buy an electronic home blood pressure machine at your local pharmacy. Insurance will sometimes cover the cost if you have a prescription.  Ask your doctor what type  of machine is best for you. There are different machines for your arm and your wrist.  If you decide to buy a machine to check your blood pressure on your arm, first check the size of your arm so you can buy the right size cuff. To check the size of your arm:   Use a measuring tape that shows both inches and centimeters.   Wrap the measuring tape around the upper-middle part of your arm. You may need someone to help you measure.   Write down your arm measurement in both inches and centimeters.   To measure your blood pressure correctly, it is important to have the right size cuff.   If your arm is up to 13 inches (up to 34 centimeters), get an adult cuff size.  If your arm is 13 to 17 inches (35 to 44 centimeters), get a large adult cuff size.    If your arm is 17 to 20 inches (45 to 52 centimeters), get an adult thigh cuff.  WHAT DO THE NUMBERS MEAN?   There are two numbers that make up your blood pressure. For example: 120/80.  The first number (120 in our example) is called the "systolic pressure."  It is a measure of the pressure in your blood vessels when your heart is pumping blood.  The second number (80 in our example) is called the "diastolic pressure." It is a measure of the pressure in your blood vessels when your heart is resting between beats.  Your doctor will tell you what your blood pressure should be. WHAT SHOULD I DO BEFORE I CHECK MY BLOOD PRESSURE?   Try to rest or relax for at least 30 minutes before you check your blood pressure.  Do not smoke.  Do not have any drinks with caffeine, such as:  Soda.  Coffee.  Tea.  Check your blood pressure in a quiet room.  Sit down and stretch out your arm on a table. Keep your arm at about the level of your heart. Let your arm relax.  Make sure that your legs are not crossed. HOW DO I CHECK MY BLOOD PRESSURE?  Follow the directions that came with your machine.  Make sure you remove any tight-fitting clothing from your arm or wrist. Wrap the cuff around your upper arm or wrist. You should be able to fit a finger between the cuff and your arm. If you cannot fit a finger between the cuff and your arm, it is too tight and should be removed and rewrapped.  Some units require you to manually pump up the arm cuff.  Automatic units inflate the cuff when you press a button.  Cuff deflation is automatic in both models.  After the cuff is inflated, the unit measures your blood pressure and pulse. The readings are shown on a monitor. Hold still and breathe normally while the cuff is inflated.  Getting a reading takes less than a minute.  Some models store readings in a memory. Some provide a printout of readings. If your machine does not store your readings, keep a written record.  Take readings with you to your next visit with your doctor.   This information is not intended to replace advice given to you by your health care provider. Make sure you discuss any questions you have with your health care provider.   Document  Released: 07/02/2008 Document Revised: 08/10/2014 Document Reviewed: 09/14/2013 Elsevier Interactive Patient Education 2016 [place] DASH stands for "  Dietary Approaches to Stop Hypertension." The DASH eating plan is a healthy eating plan that has been shown to reduce high blood pressure (hypertension). Additional health benefits may include reducing the risk of type 2 diabetes mellitus, heart disease, and stroke. The DASH eating plan may also help with weight loss. WHAT DO I NEED TO KNOW ABOUT THE DASH EATING PLAN? For the DASH eating plan, you will follow these general guidelines:  Choose foods with a percent daily value for sodium of less than 5% (as listed on the food label).  Use salt-free seasonings or herbs instead of table salt or sea salt.  Check with your health care provider or pharmacist before using salt substitutes.  Eat lower-sodium products, often labeled as "lower sodium" or "no salt added."  Eat fresh foods.  Eat more vegetables, fruits, and low-fat dairy products.  Choose whole grains. Look for the word "whole" as the first word in the ingredient list.  Choose fish and skinless chicken or [place] more often than red meat. Limit fish, poultry, and meat to 6 oz (170 g) each day.  Limit sweets, desserts, sugars, and sugary drinks.  Choose heart-healthy fats.  Limit cheese to 1 oz (28 g) per day.  Eat more home-cooked food and less restaurant, buffet, and fast food.  Limit fried foods.  Cook foods using methods other than frying.  Limit canned vegetables. If you do use them, rinse them well to decrease the sodium.  When eating at a restaurant, ask that your food be prepared with less salt, or no salt if possible. WHAT FOODS CAN I EAT? Seek help from a dietitian for individual calorie needs. Grains Whole grain or whole wheat bread. Brown rice. Whole grain or whole wheat pasta. Quinoa, bulgur, and whole grain cereals. Low-sodium cereals.  Corn or whole wheat flour tortillas. Whole grain cornbread. Whole grain crackers. Low-sodium crackers. Vegetables Fresh or frozen vegetables (raw, steamed, roasted, or grilled). Low-sodium or reduced-sodium tomato and vegetable juices. Low-sodium or reduced-sodium tomato sauce and paste. Low-sodium or reduced-sodium canned vegetables.  Fruits All fresh, canned (in natural juice), or frozen fruits. Meat and Other Protein Products Ground beef (85% or leaner), grass-fed beef, or beef trimmed of fat. Skinless chicken or [place]. Ground chicken or [place]. Pork trimmed of fat. All fish and seafood. Eggs. Dried beans, peas, or lentils. Unsalted nuts and seeds. Unsalted canned beans. Dairy Low-fat dairy products, such as skim or 1% milk, 2% or reduced-fat cheeses, low-fat ricotta or cottage cheese, or plain low-fat yogurt. Low-sodium or reduced-sodium cheeses. Fats and Oils Tub margarines without trans fats. Light or reduced-fat mayonnaise and salad dressings (reduced sodium). Avocado. Safflower, olive, or canola oils. Natural peanut or almond butter. Other Unsalted popcorn and pretzels. The items listed above may not be a complete list of recommended foods or beverages. Contact your dietitian for more options. WHAT FOODS ARE NOT RECOMMENDED? Grains White bread. White pasta. White rice. Refined cornbread. Bagels and croissants. Crackers that contain trans fat. Vegetables Creamed or fried vegetables. Vegetables in a cheese sauce. Regular canned vegetables. Regular canned tomato sauce and paste. Regular tomato and vegetable juices. Fruits Dried fruits. Canned fruit in light or heavy syrup. Fruit juice. Meat and Other Protein Products Fatty cuts of meat. Ribs, chicken wings, bacon, sausage, bologna, salami, chitterlings, fatback, hot dogs, bratwurst, and packaged luncheon meats. Salted nuts and seeds. Canned beans with salt. Dairy Whole or 2% milk, cream, half-and-half, and cream cheese. Whole-fat or  sweetened yogurt. Full-fat cheeses or blue cheese. Nondairy  creamers and whipped toppings. Processed cheese, cheese spreads, or cheese curds. Condiments Onion and garlic salt, seasoned salt, table salt, and sea salt. Canned and packaged gravies. Worcestershire sauce. Tartar sauce. Barbecue sauce. Teriyaki sauce. Soy sauce, including reduced sodium. Steak sauce. Fish sauce. Oyster sauce. Cocktail sauce. Horseradish. Ketchup and mustard. Meat flavorings and tenderizers. Bouillon cubes. Hot sauce. Tabasco sauce. Marinades. Taco seasonings. Relishes. Fats and Oils Butter, stick margarine, lard, shortening, ghee, and bacon fat. Coconut, palm kernel, or palm oils. Regular salad dressings. Other Pickles and olives. Salted popcorn and pretzels. The items listed above may not be a complete list of foods and beverages to avoid. Contact your dietitian for more information. WHERE CAN I FIND MORE INFORMATION? National Heart, Lung, and Blood Institute: travelstabloid.com   This information is not intended to replace advice given to you by your health care provider. Make sure you discuss any questions you have with your health care provider.   Document Released: 07/09/2011 Document Revised: 08/10/2014 Document Reviewed: 05/24/2013 Elsevier Interactive Patient Education 2016 [place] and Stress Management Stress is a normal reaction to life events. It is what you feel when life demands more than you are used to or more than you can handle. Some stress can be useful. For example, the stress reaction can help you catch the last bus of the day, study for a test, or meet a deadline at work. But stress that occurs too often or for too long can cause problems. It can affect your emotional health and interfere with relationships and normal daily activities. Too much stress can weaken your immune system and increase your risk for physical illness. If you already have a medical  problem, stress can make it worse. CAUSES  All sorts of life events may cause stress. An event that causes stress for one person may not be stressful for another person. Major life events commonly cause stress. These may be positive or negative. Examples include losing your job, moving into a new home, getting married, having a baby, or losing a loved one. Less obvious life events may also cause stress, especially if they occur day after day or in combination. Examples include working long hours, driving in traffic, caring for children, being in debt, or being in a difficult relationship. SIGNS AND SYMPTOMS Stress may cause emotional symptoms including, the following:  Anxiety. This is feeling worried, afraid, on edge, overwhelmed, or out of control.  Anger. This is feeling irritated or impatient.  Depression. This is feeling sad, down, helpless, or guilty.  Difficulty focusing, remembering, or making decisions. Stress may cause physical symptoms, including the following:   Aches and pains. These may affect your head, neck, back, stomach, or other areas of your body.  Tight muscles or clenched jaw.  Low energy or trouble sleeping. Stress may cause unhealthy behaviors, including the following:   Eating to feel better (overeating) or skipping meals.  Sleeping too little, too much, or both.  Working too much or putting off tasks (procrastination).  Smoking, drinking alcohol, or using drugs to feel better. DIAGNOSIS  Stress is diagnosed through an assessment by your health care provider. Your health care provider will ask questions about your symptoms and any stressful life events.Your health care provider will also ask about your medical history and may order blood tests or other tests. Certain medical conditions and medicine can cause physical symptoms similar to stress. Mental illness can cause emotional symptoms and unhealthy behaviors similar to stress. Your health care provider  may  refer you to a mental health professional for further evaluation.  TREATMENT  Stress management is the recommended treatment for stress.The goals of stress management are reducing stressful life events and coping with stress in healthy ways.  Techniques for reducing stressful life events include the following:  Stress identification. Self-monitor for stress and identify what causes stress for you. These skills may help you to avoid some stressful events.  Time management. Set your priorities, keep a calendar of events, and learn to say "no." These tools can help you avoid making too many commitments. Techniques for coping with stress include the following:  Rethinking the problem. Try to think realistically about stressful events rather than ignoring them or overreacting. Try to find the positives in a stressful situation rather than focusing on the negatives.  Exercise. Physical exercise can release both physical and emotional tension. The key is to find a form of exercise you enjoy and do it regularly.  Relaxation techniques. These relax the body and mind. Examples include yoga, meditation, tai chi, biofeedback, deep breathing, progressive muscle relaxation, listening to music, being out in nature, journaling, and other hobbies. Again, the key is to find one or more that you enjoy and can do regularly.  Healthy lifestyle. Eat a balanced diet, get plenty of sleep, and do not smoke. Avoid using alcohol or drugs to relax.  Strong support network. Spend time with family, friends, or other people you enjoy being around.Express your feelings and talk things over with someone you trust. Counseling or talktherapy with a mental health professional may be helpful if you are having difficulty managing stress on your own. Medicine is typically not recommended for the treatment of stress.Talk to your health care provider if you think you need medicine for symptoms of stress. HOME CARE  INSTRUCTIONS  Keep all follow-up visits as directed by your health care provider.  Take all medicines as directed by your health care provider. SEEK MEDICAL CARE IF:  Your symptoms get worse or you start having new symptoms.  You feel overwhelmed by your problems and can no longer manage them on your own. SEEK IMMEDIATE MEDICAL CARE IF:  You feel like hurting yourself or someone else.   This information is not intended to replace advice given to you by your health care provider. Make sure you discuss any questions you have with your health care provider.   Document Released: 01/13/2001 Document Revised: 08/10/2014 Document Reviewed: 03/14/2013 Elsevier Interactive Patient Education [employer].

## 2016-01-04 NOTE — ED Notes (Signed)
Patient here with right sided CP that started today at 11am. No radiation, reports that her BP was elevated as well. No pain on arrival. States that she had pain earlier in week that resolved

## 2016-01-04 NOTE — ED Notes (Signed)
Radiology transporting pt. To Room

## 2016-01-04 NOTE — Patient Instructions (Signed)
     IF you received an x-ray today, you will receive an invoice from [facility]. Please contact [facility] at [phone] with questions or concerns regarding your invoice.   IF you received labwork today, you will receive an invoice from [facility]. Please contact [facility] at [phone] with questions or concerns regarding your invoice.   Our billing staff will not be able to assist you with questions regarding bills from these companies.  You will be contacted with the lab results as soon as they are available. The fastest way to get your results is to activate your My Chart account. Instructions are located on the last page of this paperwork. If you have not heard from us regarding the results in 2 weeks, please contact this office.      

## 2016-01-04 NOTE — ED Notes (Signed)
PA aware of Blood Pressure. No orders given.

## 2016-01-04 NOTE — Progress Notes (Signed)
01/04/2016 1:53 PM   DOB: [birth year] / MRN: [id number]  SUBJECTIVE:  Megan May is a 56 y.o. female presenting for right sided chest pain that she describes as sharp. She associates an elevated BP with this.  She associates mild dizziness.  She feels that she is getting worse.  She denies SOB, DOE, diaphoresis. She reports a history of erosive esophagitis and does not take any medication for this.  She is under an increased amount of stress today as she is at risk of losing her home.  She has had no change in medication and has a history of HTN for which she takes diltiazem.   She is allergic to amoxicillin-pot clavulanate; levaquin; clindamycin/lincomycin; lactose intolerance (gi); latex; and penicillins.   She  has a past medical history of Hypertension; Migraine; Anxiety; Eosinophilic esophagitis; Asthma; Posttraumatic stress disorder; Generalized anxiety disorder; Migraines; Hypersomnia, recurrent; and Depression.    She  reports that she has never smoked. She has never used smokeless tobacco. She reports that she does not drink alcohol or use illicit drugs. She  reports that she currently engages in sexual activity. She reports using the following method of birth control/protection: Surgical. The patient  has past surgical history that includes Tonsillectomy and adenoidectomy (1969); Fracture surgery (1973); Cesarean section (2004); and laparotomy (Bilateral, 06/14/2014).  Her family history includes Diabetes in her father and paternal grandmother; Heart disease in her father; Hypertension in her father; Stroke in her maternal grandfather and maternal grandmother.  Review of Systems  Constitutional: Negative for fever and chills.  Eyes: Negative for blurred vision.  Respiratory: Negative for cough and shortness of breath.   Cardiovascular: Negative for chest pain.  Gastrointestinal: Negative for nausea and abdominal pain.  Genitourinary: Negative for dysuria, urgency and  frequency.  Musculoskeletal: Negative for myalgias.  Skin: Negative for rash.  Neurological: Negative for dizziness, tingling and headaches.  Psychiatric/Behavioral: Negative for depression. The patient is not nervous/anxious.     Problem list and medications reviewed and updated by myself where necessary, and exist elsewhere in the encounter.   OBJECTIVE:  BP 194/104 mmHg  Pulse 63  Temp(Src) 98.2 F (36.8 C) (Oral)  Resp 16  Ht 5\' 7"  (1.702 m)  Wt 181 lb 6.6 oz (82.287 kg)  BMI 28.41 kg/m2  SpO2 100%  LMP 05/10/2014 (Approximate)  Physical Exam  Constitutional: She is oriented to person, place, and time. She appears well-nourished. No distress.  Eyes: EOM are normal. Pupils are equal, round, and reactive to light.  Cardiovascular: Normal rate.   Pulmonary/Chest: Effort normal.  Abdominal: She exhibits no distension.  Neurological: She is alert and oriented to person, place, and time. She displays no atrophy and no tremor. No cranial nerve deficit or sensory deficit. She exhibits normal muscle tone. She displays no seizure activity. Coordination and gait normal. GCS eye subscore is 4. GCS verbal subscore is 5. GCS motor subscore is 6.  Skin: Skin is dry. She is not diaphoretic.  Psychiatric: She has a normal mood and affect.  Vitals reviewed.   No results found for this or any previous visit (from the past 72 hour(s)).  No results found.  ASSESSMENT AND PLAN  Megan May was seen today for chest pain.  Diagnoses and all orders for this visit:  Hypertensive urgency Comments: I have discussed evaluation options but given the high probability that she will need to go to the ED she has decided she will have her friend take her directly to [facility]  ED for further evaluation and management.  She is very concerned about cost and she is worried that he cost will double with work up here.     The patient was advised to call or return to clinic if she does not see an improvement  in symptoms or to seek the care of the closest emergency department if she worsens with the above plan.   Philis Fendt, MHS, PA-C [facility] 01/04/2016 1:53 PM

## 2016-01-04 NOTE — ED Notes (Signed)
Pt verbalized understanding of d/c instructions and has no further questions. Pt stable, ambulatory and NAD. Pt is chest pain free. Pt to follow up with pcp in 1 week.

## 2016-01-04 NOTE — ED Provider Notes (Signed)
CSN: [id number]     Arrival date & time 01/04/16  1347 History   First MD Initiated Contact with Patient 01/04/16 1440     Chief Complaint  Patient presents with  . Chest Pain     (Consider location/radiation/quality/duration/timing/severity/associated sxs/prior Treatment) HPI Comments: Megan May is a 55 y.o. female with a PMHx of HTN, migraines, eosinophilic esophagitis, PTSD, asthma, GAD, and depression, who presents to the ED from urgent care for evaluation of chest pain and hypertension. Patient was seen at from an urgent care prior to arrival, and was sent here for further evaluation of possible hypertensive urgency. Patient reports that on Tuesday she had right-sided chest pain that went away after several hours, and again at 11 AM today she was driving and it reoccurred. She states that on Tuesday she noticed that the CP seemed to be associated with her blood pressure being elevated, but her blood pressure did come down at that time so she did not seek medical attention. Today she was concerned because her blood pressure still has not come down. BP upon arrival was 206/78 and at [facility] it was 194/104. She has been stable on her diltiazem XR 300mg  daily for several years, has never needed an additional BP medication in her regimen although she has tried other BP meds in the past but Diltiazem seemed to work well. Chart review reveals that her BP at outpatient visits has been 130s/90s over the last year. She hasn't seen her PCP since Aug 2016 because her PCP at [facility] resigned and she hasn't seen the doctor to whom she was reassigned. She admits to having occasional missed doses of diltiazem but since March she has been taking it regularly. She did take her diltiazem today. She reports that she has been under an extreme amount of stress due to potentially losing her home over the last several months, and thinks this may be related to why her blood pressure has been elevated. She admits to  drinking 2 large cups of tea daily, denies any other caffeine intake or any medications that contain caffeine or other products that would raise her BP. No recent changes in medications.   Describes the chest pain from earlier as sudden onset, 5/10 sharp followed by dull pressure-like pain in the right side of her chest, nonradiating, intermittent, lasting several hours and then resolving, currently resolved, with no known aggravating or alleviating factors given that she did not try any medications for it. She states that it started while at rest on both occasions. Denies change in pain with inspiration, exertion, or movements.   She denies any fevers, chills, diaphoresis, lightheadedness, vision changes, headache, ongoing chest pain, shortness breath, leg swelling, recent travel/surgery/immobilization, estrogen use, personal or family history of DVT/PE, abdominal pain, nausea vomiting, diarrhea, constipation, dysuria, hematuria, myalgias, arthralgias, rashes, numbness, tingling, weakness, claudication, orthopnea. She is a nonsmoker. Positive family history of CAD in her father who required a CABG. No other family hx of cardiac disease.   Patient is a 55 y.o. female presenting with chest pain. The history is provided by the patient and medical records. No language interpreter was used.  Chest Pain Pain location:  R chest Pain quality: dull and sharp   Pain radiates to:  Does not radiate Pain radiates to the back: no   Pain severity:  Mild Onset quality:  Sudden Duration:  4 hours Timing:  Intermittent Progression:  Resolved Chronicity:  Recurrent Context: at rest and stress   Relieved by:  None tried Worsened by:  Nothing tried Ineffective treatments:  None tried Associated symptoms: no abdominal pain, no claudication, no diaphoresis, no fever, no headache, no lower extremity edema, no nausea, no numbness, no orthopnea, no shortness of breath, not vomiting and no weakness   Risk factors:  hypertension   Risk factors: no birth control, no coronary artery disease, no diabetes mellitus, no high cholesterol, no immobilization, no prior DVT/PE, no smoking and no surgery     Past Medical History  Diagnosis Date  . Hypertension   . Migraine   . Anxiety   . Eosinophilic esophagitis   . Asthma   . Posttraumatic stress disorder   . Generalized anxiety disorder   . Migraines   . Hypersomnia, recurrent   . Depression     history of    Past Surgical History  Procedure Laterality Date  . Tonsillectomy and adenoidectomy  1969  . Fracture surgery  1973    R arm  . Cesarean section  2004  . Laparotomy Bilateral 06/14/2014    Procedure: LAPAROTOMY with right  salpingo-oophorectomy and left salpingectomy;  Surgeon: Linda Hedges, DO;  Location: [facility];  Service: Gynecology;  Laterality: Bilateral;   Family History  Problem Relation Age of Onset  . Hypertension Father   . Heart disease Father   . Diabetes Father   . Stroke Maternal Grandfather   . Stroke Maternal Grandmother   . Diabetes Paternal Grandmother    Social History  Substance Use Topics  . Smoking status: Never Smoker   . Smokeless tobacco: Never Used  . Alcohol Use: No   OB History    Gravida Para Term Preterm AB TAB SAB Ectopic Multiple Living   2 1   1  1         Review of Systems  Constitutional: Negative for fever, chills and diaphoresis.  Eyes: Negative for visual disturbance.  Respiratory: Negative for shortness of breath.   Cardiovascular: Positive for chest pain (at 11am, but none ongoing). Negative for orthopnea, claudication and leg swelling.  Gastrointestinal: Negative for nausea, vomiting, abdominal pain, diarrhea and constipation.  Genitourinary: Negative for dysuria and hematuria.  Musculoskeletal: Negative for myalgias and arthralgias.  Skin: Negative for color change.  Allergic/Immunologic: Negative for immunocompromised state.  Neurological: Negative for weakness, light-headedness,  numbness and headaches.  Psychiatric/Behavioral: Negative for confusion.   10 Systems reviewed and are negative for acute change except as noted in the HPI.    Allergies  Amoxicillin-pot clavulanate; Levaquin; Clindamycin/lincomycin; Lactose intolerance (gi); Latex; and Penicillins  Home Medications   Prior to Admission medications   Medication Sig Start Date End Date Taking? Authorizing Provider  albuterol (PROVENTIL HFA;VENTOLIN HFA) 108 (90 BASE) MCG/ACT inhaler Inhale 1-2 puffs into the lungs every 6 (six) hours as needed for wheezing or shortness of breath.    Historical Provider, MD  ALPRAZolam Duanne Moron) 0.5 MG tablet Take 0.5 mg by mouth 2 (two) times daily as needed for anxiety.     Historical Provider, MD  diltiazem (CARDIZEM CD) 300 MG 24 hr capsule Take 300 mg by mouth daily.    Historical Provider, MD  eletriptan (RELPAX) 40 MG tablet Take one at onset of hadache.   may repeat in 2 hours one time only if necessary 11/27/13   Lanice Shirts, MD   Triage VS: BP 206/78 mmHg  Pulse 66  Temp(Src) 98.7 F (37.1 C) (Oral)  Resp 18  Ht 5\' 7"  (1.702 m)  Wt 81.647 kg  BMI 28.19 kg/m2  SpO2 98%  LMP 05/10/2014 (Approximate) Recheck VS: BP 171/89 mmHg  Pulse 66  Temp(Src) 98.7 F (37.1 C) (Oral)  Resp 18  Ht 5\' 7"  (1.702 m)  Wt 81.647 kg  BMI 28.19 kg/m2  SpO2 98%  LMP 05/10/2014 (Approximate)  Physical Exam  Constitutional: She is oriented to person, place, and time. Vital signs are normal. She appears well-developed and well-nourished.  Non-toxic appearance. No distress.  Afebrile, nontoxic, NAD, BP 171/89 during exam  HENT:  Head: Normocephalic and atraumatic.  Mouth/Throat: Oropharynx is clear and moist and mucous membranes are normal.  Eyes: Conjunctivae and EOM are normal. Right eye exhibits no discharge. Left eye exhibits no discharge.  Neck: Normal range of motion. Neck supple.  Cardiovascular: Normal rate, regular rhythm, normal heart sounds and intact distal  pulses.  Exam reveals no gallop and no friction rub.   No murmur heard. RRR, nl s1/s2, no m/r/g, distal pulses intact, no pedal edema   Pulmonary/Chest: Effort normal and breath sounds normal. No respiratory distress. She has no decreased breath sounds. She has no wheezes. She has no rhonchi. She has no rales. She exhibits no tenderness, no crepitus, no deformity and no retraction.  CTAB in all lung fields, no w/r/r, no hypoxia or increased WOB, speaking in full sentences, SpO2 98% on RA  Chest wall nonTTP without crepitus, deformities, or retractions   Abdominal: Soft. Normal appearance and bowel sounds are normal. She exhibits no distension. There is no tenderness. There is no rigidity, no rebound, no guarding, no CVA tenderness, no tenderness at McBurney's point and negative Murphy's sign.  Musculoskeletal: Normal range of motion.  MAE x4 Strength and sensation grossly intact Distal pulses intact Gait steady No pedal edema, neg homan's bilaterally   Neurological: She is alert and oriented to person, place, and time. She has normal strength. No cranial nerve deficit or sensory deficit. Gait normal. GCS eye subscore is 4. GCS verbal subscore is 5. GCS motor subscore is 6.  No focal neuro deficits, gait steady, strength and sensation grossly intact  Skin: Skin is warm, dry and intact. No rash noted.  Psychiatric: Her mood appears anxious.  Somewhat anxious at times, when discussing her stress she becomes slightly tearful/anxious  Nursing note and vitals reviewed.   ED Course  Procedures (including critical care time) Labs Review Labs Reviewed  CBC - Abnormal; Notable for the following:    RBC 5.13 (*)    All other components within normal limits  BASIC METABOLIC PANEL  I-STAT TROPOININ, ED  Randolm Idol, ED    Imaging Review Dg Chest 2 View  01/04/2016  CLINICAL DATA:  Acute right chest pain, hypertension and headache EXAM: CHEST  2 VIEW COMPARISON:  04/09/2011 FINDINGS: The  heart size and mediastinal contours are within normal limits. Both lungs are clear. The visualized skeletal structures are unremarkable. External artifact over the lung apices. Mild scoliosis of the spine noted. No significant interval change. IMPRESSION: No active cardiopulmonary disease. Electronically Signed   By: Jerilynn Mages.  Shick M.D.   On: 01/04/2016 14:50   I have personally reviewed and evaluated these images and lab results as part of my medical decision-making.   EKG Interpretation   Date/Time:  Saturday January 04 2016 13:57:02 EDT Ventricular Rate:  69 PR Interval:  152 QRS Duration: 76 QT Interval:  418 QTC Calculation: 447 R Axis:   38 Text Interpretation:  Normal sinus rhythm Possible Left atrial enlargement  Borderline ECG biphasic t wave in aVL, baseline wander in  I, II, III  Otherwise no significant change Confirmed by FLOYD MD, DANIEL [phone]) on  01/04/2016 3:18:29 PM      MDM   Final diagnoses:  Atypical chest pain  Essential hypertension  Stress  Anxiety    55 y.o. female here with HTN and intermittent CP. Had CP Tuesday and again this morning, completely resolved at this time. Went to [facility] who advised that she come here because her BP was 194/104. She has been stable on her BP meds for several years, but over the last several months she's had a lot of stress in her life and noted her BP has risen. She became concerned today because the BP wasn't going down like it typically does. On exam, BP 171/89, no focal neuro deficits, no tachycardia or hypoxia, no LE swelling, no reproducible tenderness to her chest. CXR clear, EKG without acute ischemic changes and overall unchanged from prior aside from biphasic T in aVL, CBC and BMP WNL. First troponin drawn at 3hr mark hasn't resulted yet, called lab and they are looking into it. Overall, reassuring exam, and BP improving. I feel her HTN is likely related to her stress, and feel that it would not be wise to try to add BP meds to lower  it since it's likely a transient issue. She is currently asymptomatic, therefore I don't feel we should intervene acutely as we could cause more harm than good. Given that her CP was somewhat acute onset, and associated with HTN, will keep her for a second troponin at 5pm, which would be 6hrs after onset, to ensure no elevations. Will hold off on meds for now since she denies any current issues. Will monitor closely and reassess shortly  3:29 PM Trop 0.01, listed as collected at 3:11pm but it seems that this was the one from earlier, regardless will still recheck the next troponin at 5pm which would be 6hrs from onset of pain. Currently chest pain free. Resting comfortably. Will reassess shortly.   5:00 PM Still not having any symptoms. BP now 130s/90s, which is very reassuring that it is coming down on its own-- also means we likely should not add-on an additional antihypertensive as it seems her acute increases in BP are situational/stress induced and resolve on their own. Repeat trop to be drawn now, as long as this is negative we can likely d/c the pt and have her f/up outpatient. Discussed that stress/anxiety is likely playing a large part in her symptoms, she has xanax at home that she rarely takes, discussed that she could use this in addition to help with her symptoms/stress/anxiety. Will reassess after next troponin results  5:20 PM Repeat trop 0.00. No ongoing symptoms. BP still down in better range. Discussed monitoring her BPs daily to keep a log for her PCP to see. Use xanax PRN for anxiety/stress and see if it helps with CP if it recurs. Discussed avoiding salt and caffeine use. Tylenol/motrin as needed for pain, use of heat to the area discussed as well. F/up with PCP in 1wk. I explained the diagnosis and have given explicit precautions to return to the ER including for any other new or worsening symptoms. The patient understands and accepts the medical plan as it's been dictated and I have  answered their questions. Discharge instructions concerning home care and prescriptions have been given. The patient is STABLE and is discharged to home in good condition.   BP 133/74 mmHg  Pulse 54  Temp(Src) 98.7 F (37.1 C) (Oral)  Resp 14  Ht 5\' 7"  (1.702 m)  Wt 81.647 kg  BMI 28.19 kg/m2  SpO2 100%  LMP 05/10/2014 (Approximate)  No orders of the defined types were placed in this encounter.     Kashmir Lysaght Camprubi-Soms, PA-C 01/04/16 [place], DO 01/05/16 [phone]

## 2016-01-07 ENCOUNTER — Encounter: Payer: Self-pay | Admitting: Family Medicine

## 2016-01-07 ENCOUNTER — Ambulatory Visit (INDEPENDENT_AMBULATORY_CARE_PROVIDER_SITE_OTHER): Payer: BC Managed Care – PPO | Admitting: Family Medicine

## 2016-01-07 VITALS — BP 154/100 | HR 75 | Temp 98.7°F | Ht 67.0 in | Wt 186.0 lb

## 2016-01-07 DIAGNOSIS — I10 Essential (primary) hypertension: Secondary | ICD-10-CM | POA: Diagnosis not present

## 2016-01-07 DIAGNOSIS — F4322 Adjustment disorder with anxiety: Secondary | ICD-10-CM

## 2016-01-07 DIAGNOSIS — R0789 Other chest pain: Secondary | ICD-10-CM | POA: Diagnosis not present

## 2016-01-07 DIAGNOSIS — K200 Eosinophilic esophagitis: Secondary | ICD-10-CM

## 2016-01-07 DIAGNOSIS — R1011 Right upper quadrant pain: Secondary | ICD-10-CM

## 2016-01-07 DIAGNOSIS — Z1239 Encounter for other screening for malignant neoplasm of breast: Secondary | ICD-10-CM

## 2016-01-07 DIAGNOSIS — K209 Esophagitis, unspecified without bleeding: Secondary | ICD-10-CM

## 2016-01-07 DIAGNOSIS — F419 Anxiety disorder, unspecified: Secondary | ICD-10-CM

## 2016-01-07 MED ORDER — NITROGLYCERIN 0.4 MG SL SUBL: 0.4000 mg | SUBLINGUAL_TABLET | SUBLINGUAL | Status: DC | PRN

## 2016-01-07 MED ORDER — ASPIRIN EC 81 MG PO TBEC: 81.0000 mg | DELAYED_RELEASE_TABLET | Freq: Every day | ORAL | Status: DC

## 2016-01-07 MED ORDER — METOPROLOL SUCCINATE ER 50 MG PO TB24: 50.0000 mg | ORAL_TABLET | Freq: Every day | ORAL | Status: DC

## 2016-01-07 NOTE — Patient Instructions (Addendum)
Mylanta if it helps it is the Esophagus. Salon Pas patches or gel, Lidocaine if they help costochondritis Xanax 1 or 2 helps then anxiety Ntg can help esophagus and heart, can cause a headache  Zyprexa, Abilify or Seroquel     Aspirin and Your Heart  Aspirin is a medicine that affects the way blood clots. Aspirin can be used to help reduce the risk of blood clots, heart attacks, and other heart-related problems.  SHOULD I TAKE ASPIRIN? Your health care provider will help you determine whether it is safe and beneficial for you to take aspirin daily. Taking aspirin daily may be beneficial if you:  Have had a heart attack or chest pain.  Have undergone open heart surgery such as coronary artery bypass surgery (CABG).  Have had coronary angioplasty.  Have experienced a stroke or transient ischemic attack (TIA).  Have peripheral vascular disease (PVD).  Have chronic heart rhythm problems such as atrial fibrillation. ARE THERE ANY RISKS OF TAKING ASPIRIN DAILY? Daily use of aspirin can increase your risk of side effects. Some of these include:  Bleeding. Bleeding problems can be minor or serious. An example of a minor problem is a cut that does not stop bleeding. An example of a more serious problem is stomach bleeding or bleeding into the brain. Your risk of bleeding is increased if you are also taking non-steroidal anti-inflammatory medicine (NSAIDs).  Increased bruising.  Upset stomach.  An allergic reaction. People who have nasal polyps have an increased risk of developing an aspirin allergy. WHAT ARE SOME GUIDELINES I SHOULD FOLLOW WHEN TAKING ASPIRIN?   Take aspirin only as directed by your health care provider. Make sure you understand how much you should take and what form you should take. The two forms of aspirin are:  Non-enteric-coated. This type of aspirin does not have a coating and is absorbed quickly. Non-enteric-coated aspirin is usually recommended for people with  chest pain. This type of aspirin also comes in a chewable form.  Enteric-coated. This type of aspirin has a special coating that releases the medicine very slowly. Enteric-coated aspirin causes less stomach upset than non-enteric-coated aspirin. This type of aspirin should not be chewed or crushed.  Drink alcohol in moderation. Drinking alcohol increases your risk of bleeding. WHEN SHOULD I SEEK MEDICAL CARE?   You have unusual bleeding or bruising.  You have stomach pain.  You have an allergic reaction. Symptoms of an allergic reaction include:  Hives.  Itchy skin.  Swelling of the lips, tongue, or face.  You have ringing in your ears. WHEN SHOULD I SEEK IMMEDIATE MEDICAL CARE?   Your bowel movements are bloody, dark red, or black in color.  You vomit or cough up blood.  You have blood in your urine.  You cough, wheeze, or feel short of breath. If you have any of the following symptoms, this is an emergency. Do not wait to see if the pain will go away. Get medical help at once. Call your local emergency services (911 in the [place] Do not drive yourself to the hospital.  You have severe chest pain, especially if the pain is crushing or pressure-like and spreads to the arms, back, neck, or jaw.  You have stroke-like symptoms, such as:   Loss of vision.   Difficulty talking.   Numbness or weakness on one side of your body.   Numbness or weakness in your arm or leg.   Not thinking clearly or feeling confused.    This information  is not intended to replace advice given to you by your health care provider. Make sure you discuss any questions you have with your health care provider.   Document Released: 07/02/2008 Document Revised: 08/10/2014 Document Reviewed: 10/25/2013 Elsevier Interactive Patient Education 2016 Elsevier Inc. Generalized Anxiety Disorder Generalized anxiety disorder (GAD) is a mental disorder. It interferes with life functions, including  relationships, work, and school. GAD is different from normal anxiety, which everyone experiences at some point in their lives in response to specific life events and activities. Normal anxiety actually helps [place] prepare for and get through these life events and activities. Normal anxiety goes away after the event or activity is over.  GAD causes anxiety that is not necessarily related to specific events or activities. It also causes excess anxiety in proportion to specific events or activities. The anxiety associated with GAD is also difficult to control. GAD can vary from mild to severe. People with severe GAD can have intense waves of anxiety with physical symptoms (panic attacks).  SYMPTOMS The anxiety and worry associated with GAD are difficult to control. This anxiety and worry are related to many life events and activities and also occur more days than not for 6 months or longer. People with GAD also have three or more of the following symptoms (one or more in children):  Restlessness.   Fatigue.  Difficulty concentrating.   Irritability.  Muscle tension.  Difficulty sleeping or unsatisfying sleep. DIAGNOSIS GAD is diagnosed through an assessment by your health care provider. Your health care provider will ask you questions aboutyour mood,physical symptoms, and events in your life. Your health care provider may ask you about your medical history and use of alcohol or drugs, including prescription medicines. Your health care provider may also do a physical exam and blood tests. Certain medical conditions and the use of certain substances can cause symptoms similar to those associated with GAD. Your health care provider may refer you to a mental health specialist for further evaluation. TREATMENT The following therapies are usually used to treat GAD:   Medication. Antidepressant medication usually is prescribed for long-term daily control. Antianxiety medicines may be added in severe  cases, especially when panic attacks occur.   Talk therapy (psychotherapy). Certain types of talk therapy can be helpful in treating GAD by providing support, education, and guidance. A form of talk therapy called cognitive behavioral therapy can teach you healthy ways to think about and react to daily life events and activities.  Stress managementtechniques. These include yoga, meditation, and exercise and can be very helpful when they are practiced regularly. A mental health specialist can help determine which treatment is best for you. Some people see improvement with one therapy. However, other people require a combination of therapies.   This information is not intended to replace advice given to you by your health care provider. Make sure you discuss any questions you have with your health care provider.   Document Released: 11/14/2012 Document Revised: 08/10/2014 Document Reviewed: 11/14/2012 Elsevier Interactive Patient Education 2016 Elsevier Inc. Cholecystitis Cholecystitis is inflammation of the gallbladder. It is often called a gallbladder attack. The gallbladder is a pear-shaped organ that lies beneath the liver on the right side of the body. The gallbladder stores bile, which is a fluid that helps the body to digest fats. If bile builds up in your gallbladder, your gallbladder becomes inflamed. This condition may occur suddenly (be acute). Repeat episodes of acute cholecystitis or prolonged episodes may lead to a long-term (chronic)  condition. Cholecystitis is serious and it requires treatment.  CAUSES The most common cause of this condition is gallstones. Gallstones can block the tube (duct) that carries bile out of your gallbladder. This causes bile to build up. Other causes of this condition include:  Damage to the gallbladder due to a decrease in blood flow.  Infections in the bile ducts.  Scars or kinks in the bile ducts.  Tumors in the liver, pancreas, or  gallbladder. RISK FACTORS This condition is more likely to develop in:  People who have sickle cell disease.  People who take birth control pills or use estrogen.  People who have alcoholic liver disease.  People who have liver cirrhosis.  People who have their nutrition delivered through a vein (parenteral nutrition).  People who do not eat or drink (do fasting) for a long period of time.  People who are obese.  People who have rapid weight loss.  People who are pregnant.  People who have increased triglyceride levels.  People who have pancreatitis. SYMPTOMS Symptoms of this condition include:  Abdominal pain, especially in the upper right area of the abdomen.  Abdominal tenderness or bloating.  Nausea.  Vomiting.  Fever.  Chills.  Yellowing of the skin and the whites of the eyes (jaundice). DIAGNOSIS This condition is diagnosed with a medical history and physical exam. You may also have other tests, including:  Imaging tests, such as:  An ultrasound of the gallbladder.  A CT scan of the abdomen.  A gallbladder nuclear scan (HIDA scan). This scan allows your health care provider to see the bile moving from your liver to your gallbladder and to your small intestine.  MRI.  Blood tests, such as:  A complete blood count, because the white blood cell count may be higher than normal.  Liver function tests, because some levels may be higher than normal with certain types of gallstones. TREATMENT Treatment may include:  Fasting for a certain amount of time.  IV fluids.  Medicine to treat pain or vomiting.  Antibiotic medicine.  Surgery to remove your gallbladder (cholecystectomy). This may happen immediately or at a later time. [place] care will depend on your treatment. In general:  Take over-the-counter and prescription medicines only as told by your health care provider.  If you were prescribed an antibiotic medicine, take  it as told by your health care provider. Do not stop taking the antibiotic even if you start to feel better.  Follow instructions from your health care provider about what to eat or drink. When you are allowed to eat, avoid eating or drinking anything that triggers your symptoms.  Keep all follow-up visits as told by your health care provider. This is important. SEEK MEDICAL CARE IF:  Your pain is not controlled with medicine.  You have a fever. SEEK IMMEDIATE MEDICAL CARE IF:  Your pain moves to another part of your abdomen or to your back.  You continue to have symptoms or you develop new symptoms even with treatment.   This information is not intended to replace advice given to you by your health care provider. Make sure you discuss any questions you have with your health care provider.   Document Released: 07/20/2005 Document Revised: 04/10/2015 Document Reviewed: 10/31/2014 Elsevier Interactive Patient Education 2016 Elsevier Inc. Costochondritis Costochondritis, sometimes called Tietze syndrome, is a swelling and irritation (inflammation) of the tissue (cartilage) that connects your ribs with your breastbone (sternum). It causes pain in the chest and rib area.  Costochondritis usually goes away on its own over time. It can take up to 6 weeks or longer to get better, especially if you are unable to limit your activities. CAUSES  Some cases of costochondritis have no known cause. Possible causes include:  Injury (trauma).  Exercise or activity such as lifting.  Severe coughing. SIGNS AND SYMPTOMS  Pain and tenderness in the chest and rib area.  Pain that gets worse when coughing or taking deep breaths.  Pain that gets worse with specific movements. DIAGNOSIS  Your health care provider will do a physical exam and ask about your symptoms. Chest X-rays or other tests may be done to rule out other problems. TREATMENT  Costochondritis usually goes away on its own over time.  Your health care provider may prescribe medicine to help relieve pain. HOME CARE INSTRUCTIONS   Avoid exhausting physical activity. Try not to strain your ribs during normal activity. This would include any activities using chest, abdominal, and side muscles, especially if heavy weights are used.  Apply ice to the affected area for the first 2 days after the pain begins.  Put ice in a plastic bag.  Place a towel between your skin and the bag.  Leave the ice on for 20 minutes, 2-3 times a day.  Only take over-the-counter or prescription medicines as directed by your health care provider. SEEK MEDICAL CARE IF:  You have redness or swelling at the rib joints. These are signs of infection.  Your pain does not go away despite rest or medicine. SEEK IMMEDIATE MEDICAL CARE IF:   Your pain increases or you are very uncomfortable.  You have shortness of breath or difficulty breathing.  You cough up blood.  You have worse chest pains, sweating, or vomiting.  You have a fever or persistent symptoms for more than 2-3 days.  You have a fever and your symptoms suddenly get worse. MAKE SURE YOU:   Understand these instructions.  Will watch your condition.  Will get help right away if you are not doing well or get worse.   This information is not intended to replace advice given to you by your health care provider. Make sure you discuss any questions you have with your health care provider.   Document Released: 04/29/2005 Document Revised: 05/10/2013 Document Reviewed: 02/21/2013 Elsevier Interactive Patient Education [employer].

## 2016-01-07 NOTE — Progress Notes (Signed)
Pre visit review using our clinic review tool, if applicable. No additional management support is needed unless otherwise documented below in the visit note. 

## 2016-01-17 ENCOUNTER — Telehealth: Payer: Self-pay | Admitting: Family Medicine

## 2016-01-17 DIAGNOSIS — R0789 Other chest pain: Secondary | ICD-10-CM | POA: Insufficient documentation

## 2016-01-17 DIAGNOSIS — R1011 Right upper quadrant pain: Secondary | ICD-10-CM | POA: Insufficient documentation

## 2016-01-17 NOTE — Assessment & Plan Note (Signed)
Poorly controlled will alter medications, encouraged DASH diet, minimize caffeine and obtain adequate sleep. Report concerning symptoms and follow up as directed and as needed. Increase Metoprolol XL to 50 mg daily

## 2016-01-17 NOTE — Progress Notes (Signed)
Patient ID: Megan May, female   DOB: Aug 03, 1961, 55 y.o.   MRN: [id number]   Subjective:    Patient ID: Megan May, female    DOB: January 30, 1961, 54 y.o.   MRN: [id number]  Chief Complaint  Patient presents with  . Follow-up    hypertension    HPI Patient is in today for Emergency Room visit. She presented to ED for atypical sharp chest pain and was noted to have significantly elevated bp around 180/100. She denied any associated symptoms such as palpitations, nausea, diaphoresis. No recent febrile illness. Does have reflux at times.  Past Medical History  Diagnosis Date  . Hypertension   . Migraine   . Anxiety   . Eosinophilic esophagitis   . Asthma   . Posttraumatic stress disorder   . Generalized anxiety disorder   . Migraines   . Hypersomnia, recurrent   . Depression     history of     Past Surgical History  Procedure Laterality Date  . Tonsillectomy and adenoidectomy  1969  . Fracture surgery  1973    R arm  . Cesarean section  2004  . Laparotomy Bilateral 06/14/2014    Procedure: LAPAROTOMY with right  salpingo-oophorectomy and left salpingectomy;  Surgeon: Linda Hedges, DO;  Location: [facility];  Service: Gynecology;  Laterality: Bilateral;    Family History  Problem Relation Age of Onset  . Hypertension Father   . Heart disease Father   . Diabetes Father   . Stroke Maternal Grandfather   . Stroke Maternal Grandmother   . Diabetes Paternal Grandmother     Social History   Social History  . Marital Status: Married    Spouse Name: Randall Hiss  . Number of Children: 1  . Years of Education: 2 Masters   Occupational History  . SPECIAL EDUCATOR     Teacher, early years/pre   Social History Main Topics  . Smoking status: Never Smoker   . Smokeless tobacco: Never Used  . Alcohol Use: No  . Drug Use: No  . Sexual Activity: Yes    Birth Control/ Protection: Surgical   Other Topics Concern  . Not on file   Social History Narrative   Patient  is right handed, resides in home with husband and daughter. She consumes 16 oz of tea daily.   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    Outpatient Prescriptions Prior to Visit  Medication Sig Dispense Refill  . albuterol (PROVENTIL HFA;VENTOLIN HFA) 108 (90 BASE) MCG/ACT inhaler Inhale 1-2 puffs into the lungs every 6 (six) hours as needed for wheezing or shortness of breath.    . ALPRAZolam (XANAX) 0.5 MG tablet Take 0.5 mg by mouth 2 (two) times daily as needed for anxiety.     Marland Kitchen diltiazem (CARDIZEM CD) 300 MG 24 hr capsule Take 300 mg by mouth daily.    Marland Kitchen eletriptan (RELPAX) 40 MG tablet Take one at onset of hadache.   may repeat in 2 hours one time only if necessary 10 tablet 0   No facility-administered medications prior to visit.    Allergies  Allergen Reactions  . Amoxicillin-Pot Clavulanate Rash  . Levaquin [Levofloxacin In D5w] Other (See Comments)    tendonopathy  . Clindamycin/Lincomycin Itching  . Lactose Intolerance (Gi) Nausea Only  . Latex Rash  . Penicillins Swelling and Rash    Review of Systems  Constitutional: Negative for fever and malaise/fatigue.  HENT: Negative for congestion.   Eyes: Negative for  blurred vision.  Respiratory: Negative for shortness of breath.   Cardiovascular: Positive for chest pain. Negative for palpitations and leg swelling.  Gastrointestinal: Positive for heartburn. Negative for nausea, abdominal pain and blood in stool.  Genitourinary: Negative for dysuria and frequency.  Musculoskeletal: Negative for falls.  Skin: Negative for rash.  Neurological: Negative for dizziness, loss of consciousness and headaches.  Endo/Heme/Allergies: Negative for environmental allergies.  Psychiatric/Behavioral: Negative for depression. The patient is not nervous/anxious.        Objective:    Physical Exam  Constitutional: She is oriented to person, place, and time. She appears well-developed and well-nourished. No distress.  HENT:    Head: Normocephalic and atraumatic.  Nose: Nose normal.  Eyes: Right eye exhibits no discharge. Left eye exhibits no discharge.  Neck: Normal range of motion. Neck supple.  Cardiovascular: Normal rate and regular rhythm.   No murmur heard. Pulmonary/Chest: Effort normal and breath sounds normal.  Abdominal: Soft. Bowel sounds are normal. There is no tenderness.  Musculoskeletal: She exhibits no edema.  Neurological: She is alert and oriented to person, place, and time.  Skin: Skin is warm and dry.  Psychiatric: She has a normal mood and affect.  Nursing note and vitals reviewed.   BP 154/100 mmHg  Pulse 75  Temp(Src) 98.7 F (37.1 C) (Oral)  Ht 5\' 7"  (1.702 m)  Wt 186 lb (84.369 kg)  BMI 29.12 kg/m2  SpO2 97%  LMP 05/10/2014 (Approximate) Wt Readings from Last 3 Encounters:  01/07/16 186 lb (84.369 kg)  01/04/16 180 lb (81.647 kg)  01/04/16 181 lb 6.6 oz (82.287 kg)     Lab Results  Component Value Date   WBC 7.2 01/04/2016   HGB 14.5 01/04/2016   HCT 42.1 01/04/2016   PLT 219 01/04/2016   GLUCOSE 93 01/04/2016   CHOL 214* 11/27/2013   TRIG 140 11/27/2013   HDL 61 11/27/2013   LDLCALC 125* 11/27/2013   ALT 9 03/08/2015   AST 12 03/08/2015   NA 140 01/04/2016   K 3.5 01/04/2016   CL 107 01/04/2016   CREATININE 0.68 01/04/2016   BUN 11 01/04/2016   CO2 25 01/04/2016   TSH 2.582 11/27/2013    Lab Results  Component Value Date   TSH 2.582 11/27/2013   Lab Results  Component Value Date   WBC 7.2 01/04/2016   HGB 14.5 01/04/2016   HCT 42.1 01/04/2016   MCV 82.1 01/04/2016   PLT 219 01/04/2016   Lab Results  Component Value Date   NA 140 01/04/2016   K 3.5 01/04/2016   CO2 25 01/04/2016   GLUCOSE 93 01/04/2016   BUN 11 01/04/2016   CREATININE 0.68 01/04/2016   BILITOT 0.8 03/08/2015   ALKPHOS 84 03/08/2015   AST 12 03/08/2015   ALT 9 03/08/2015   PROT 6.7 03/08/2015   ALBUMIN 4.1 03/08/2015   CALCIUM 10.0 01/04/2016   ANIONGAP 8 01/04/2016    GFR 101.26 03/08/2015   Lab Results  Component Value Date   CHOL 214* 11/27/2013   Lab Results  Component Value Date   HDL 61 11/27/2013   Lab Results  Component Value Date   LDLCALC 125* 11/27/2013   Lab Results  Component Value Date   TRIG 140 11/27/2013   Lab Results  Component Value Date   CHOLHDL 3.5 11/27/2013   No results found for: HGBA1C     Assessment & Plan:   Problem List Items Addressed This Visit    RUQ pain  Intermittent. Minimize spicy and fatty foods and proceed with abdominal ultrasound      Relevant Orders   US Abdomen Complete   Essential hypertension, benign    Poorly controlled will alter medications, encouraged DASH diet, minimize caffeine and obtain adequate sleep. Report concerning symptoms and follow up as directed and as needed. Increase Metoprolol XL to 50 mg daily      Relevant Medications   metoprolol succinate (TOPROL-XL) 50 MG 24 hr tablet   nitroGLYCERIN (NITROSTAT) 0.4 MG SL tablet   aspirin EC 81 MG tablet   Eosinophilic esophagitis    Avoid offending foods, start NOW probiotics. Do not eat large meals in late evening and consider raising head of bed.       Atypical chest pain - Primary    Intermittent. Likely multifactorial. None today. Asked to start an ECASA 81 mg daily and use NTG prn. Seek care if worsens. Referred to cardiology for further consideration      Relevant Orders   Ambulatory referral to Cardiology   US Abdomen Complete   Anxiety    Encouraged to use Alprazolam sparingly when symptoms flare and to consider daily meds if worsens.       Other Visit Diagnoses    Esophagitis        Relevant Orders    US Abdomen Complete    Essential hypertension        Relevant Medications    metoprolol succinate (TOPROL-XL) 50 MG 24 hr tablet    nitroGLYCERIN (NITROSTAT) 0.4 MG SL tablet    aspirin EC 81 MG tablet    Other Relevant Orders    Ambulatory referral to Cardiology    Adjustment disorder with anxious mood         Breast cancer screening        Relevant Orders    MM Digital Screening       I am having Ms. Harley Alto start on metoprolol succinate, nitroGLYCERIN, and aspirin EC. I am also having her maintain her ALPRAZolam, eletriptan, albuterol, and diltiazem.  Meds ordered this encounter  Medications  . metoprolol succinate (TOPROL-XL) 50 MG 24 hr tablet    Sig: Take 1 tablet (50 mg total) by mouth daily. Take with or immediately following a meal.    Dispense:  90 tablet    Refill:  3  . nitroGLYCERIN (NITROSTAT) 0.4 MG SL tablet    Sig: Place 1 tablet (0.4 mg total) under the tongue every 5 (five) minutes as needed for chest pain.    Dispense:  25 tablet    Refill:  3  . aspirin EC 81 MG tablet    Sig: Take 1 tablet (81 mg total) by mouth daily.     Penni Homans, MD

## 2016-01-17 NOTE — Assessment & Plan Note (Signed)
Intermittent. Minimize spicy and fatty foods and proceed with abdominal ultrasound

## 2016-01-17 NOTE — Assessment & Plan Note (Signed)
Encouraged to use Alprazolam sparingly when symptoms flare and to consider daily meds if worsens.

## 2016-01-17 NOTE — Telephone Encounter (Signed)
Attempted to contact patient. Appointment set for [id number] but conflicted with another patient's appointment.  Left messages for patient to call the office to confirm receiving the messages. Original message left on 6/12. Follow up message left on 6/16.

## 2016-01-17 NOTE — Assessment & Plan Note (Signed)
Intermittent. Likely multifactorial. None today. Asked to start an ECASA 81 mg daily and use NTG prn. Seek care if worsens. Referred to cardiology for further consideration

## 2016-01-17 NOTE — Assessment & Plan Note (Signed)
Avoid offending foods, start NOW probiotics. Do not eat large meals in late evening and consider raising head of bed.

## 2016-01-28 ENCOUNTER — Ambulatory Visit (HOSPITAL_BASED_OUTPATIENT_CLINIC_OR_DEPARTMENT_OTHER)
Admission: RE | Admit: 2016-01-28 | Discharge: 2016-01-28 | Disposition: A | Discharge status: Home / Self Care | Payer: BC Managed Care – PPO | Source: Ambulatory Visit | Attending: Family Medicine | Admitting: Family Medicine

## 2016-01-28 ENCOUNTER — Ambulatory Visit (INDEPENDENT_AMBULATORY_CARE_PROVIDER_SITE_OTHER): Payer: BC Managed Care – PPO | Admitting: Family Medicine

## 2016-01-28 ENCOUNTER — Encounter: Payer: Self-pay | Admitting: Family Medicine

## 2016-01-28 VITALS — BP 120/72 | HR 55 | Temp 98.5°F | Ht 67.0 in | Wt 186.2 lb

## 2016-01-28 DIAGNOSIS — Z1239 Encounter for other screening for malignant neoplasm of breast: Secondary | ICD-10-CM

## 2016-01-28 DIAGNOSIS — K200 Eosinophilic esophagitis: Secondary | ICD-10-CM | POA: Diagnosis not present

## 2016-01-28 DIAGNOSIS — F419 Anxiety disorder, unspecified: Secondary | ICD-10-CM

## 2016-01-28 DIAGNOSIS — Z1231 Encounter for screening mammogram for malignant neoplasm of breast: Secondary | ICD-10-CM | POA: Diagnosis not present

## 2016-01-28 DIAGNOSIS — K209 Esophagitis, unspecified without bleeding: Secondary | ICD-10-CM

## 2016-01-28 DIAGNOSIS — K7689 Other specified diseases of liver: Secondary | ICD-10-CM | POA: Diagnosis not present

## 2016-01-28 DIAGNOSIS — R1011 Right upper quadrant pain: Secondary | ICD-10-CM

## 2016-01-28 DIAGNOSIS — R0789 Other chest pain: Secondary | ICD-10-CM | POA: Diagnosis present

## 2016-01-28 DIAGNOSIS — I10 Essential (primary) hypertension: Secondary | ICD-10-CM

## 2016-01-28 MED ORDER — HYOSCYAMINE SULFATE 0.125 MG SL SUBL: 0.1250 mg | SUBLINGUAL_TABLET | SUBLINGUAL | Status: DC | PRN

## 2016-01-28 NOTE — Progress Notes (Signed)
Pre visit review using our clinic review tool, if applicable. No additional management support is needed unless otherwise documented below in the visit note. 

## 2016-01-28 NOTE — Patient Instructions (Addendum)
maalox can help the esophagus when chest pain occurs.  Can also try Hyoscyamine for the spasm  Consider Ranitidine/Zantac 150 mg tabs OTC, 1 tab twice daily or 2 tabs once daily, at bedtime  Gastroesophageal Reflux Disease, Adult Normally, food travels down the esophagus and stays in the stomach to be digested. However, when a person has gastroesophageal reflux disease (GERD), food and stomach acid move back up into the esophagus. When this happens, the esophagus becomes sore and inflamed. Over time, GERD can create small holes (ulcers) in the lining of the esophagus.  CAUSES This condition is caused by a problem with the muscle between the esophagus and the stomach (lower esophageal sphincter, or LES). Normally, the LES muscle closes after food passes through the esophagus to the stomach. When the LES is weakened or abnormal, it does not close properly, and that allows food and stomach acid to go back up into the esophagus. The LES can be weakened by certain dietary substances, medicines, and medical conditions, including:  Tobacco use.  Pregnancy.  Having a hiatal hernia.  Heavy alcohol use.  Certain foods and beverages, such as coffee, chocolate, onions, and peppermint. RISK FACTORS This condition is more likely to develop in:  People who have an increased body weight.  People who have connective tissue disorders.  People who use NSAID medicines. SYMPTOMS Symptoms of this condition include:  Heartburn.  Difficult or painful swallowing.  The feeling of having a lump in the throat.  Abitter taste in the mouth.  Bad breath.  Having a large amount of saliva.  Having an upset or bloated stomach.  Belching.  Chest pain.  Shortness of breath or wheezing.  Ongoing (chronic) cough or a night-time cough.  Wearing away of tooth enamel.  Weight loss. Different conditions can cause chest pain. Make sure to see your health care provider if you experience chest  pain. DIAGNOSIS Your health care provider will take a medical history and perform a physical exam. To determine if you have mild or severe GERD, your health care provider may also monitor how you respond to treatment. You may also have other tests, including:  An endoscopy toexamine your stomach and esophagus with a small camera.  A test thatmeasures the acidity level in your esophagus.  A test thatmeasures how much pressure is on your esophagus.  A barium swallow or modified barium swallow to show the shape, size, and functioning of your esophagus. TREATMENT The goal of treatment is to help relieve your symptoms and to prevent complications. Treatment for this condition may vary depending on how severe your symptoms are. Your health care provider may recommend:  Changes to your diet.  Medicine.  Surgery. HOME CARE INSTRUCTIONS Diet  Follow a diet as recommended by your health care provider. This may involve avoiding foods and drinks such as:  Coffee and tea (with or without caffeine).  Drinks that containalcohol.  Energy drinks and sports drinks.  Carbonated drinks or sodas.  Chocolate and cocoa.  Peppermint and mint flavorings.  Garlic and onions.  Horseradish.  Spicy and acidic foods, including peppers, chili powder, curry powder, vinegar, hot sauces, and barbecue sauce.  Citrus fruit juices and citrus fruits, such as oranges, lemons, and limes.  Tomato-based foods, such as red sauce, chili, salsa, and pizza with red sauce.  Fried and fatty foods, such as donuts, french fries, potato chips, and high-fat dressings.  High-fat meats, such as hot dogs and fatty cuts of red and white meats, such as rib  eye steak, sausage, ham, and bacon.  High-fat dairy items, such as whole milk, butter, and cream cheese.  Eat small, frequent meals instead of large meals.  Avoid drinking large amounts of liquid with your meals.  Avoid eating meals during the 2-3 hours before  bedtime.  Avoid lying down right after you eat.  Do not exercise right after you eat. General Instructions  Pay attention to any changes in your symptoms.  Take over-the-counter and prescription medicines only as told by your health care provider. Do not take aspirin, ibuprofen, or other NSAIDs unless your health care provider told you to do so.  Do not use any tobacco products, including cigarettes, chewing tobacco, and e-cigarettes. If you need help quitting, ask your health care provider.  Wear loose-fitting clothing. Do not wear anything tight around your waist that causes pressure on your abdomen.  Raise (elevate) the head of your bed 6 inches (15cm).  Try to reduce your stress, such as with yoga or meditation. If you need help reducing stress, ask your health care provider.  If you are overweight, reduce your weight to an amount that is healthy for you. Ask your health care provider for guidance about a safe weight loss goal.  Keep all follow-up visits as told by your health care provider. This is important. SEEK MEDICAL CARE IF:  You have new symptoms.  You have unexplained weight loss.  You have difficulty swallowing, or it hurts to swallow.  You have wheezing or a persistent cough.  Your symptoms do not improve with treatment.  You have a hoarse voice. SEEK IMMEDIATE MEDICAL CARE IF:  You have pain in your arms, neck, jaw, teeth, or back.  You feel sweaty, dizzy, or light-headed.  You have chest pain or shortness of breath.  You vomit and your vomit looks like blood or coffee grounds.  You faint.  Your stool is bloody or black.  You cannot swallow, drink, or eat.   This information is not intended to replace advice given to you by your health care provider. Make sure you discuss any questions you have with your health care provider.   Document Released: 04/29/2005 Document Revised: 04/10/2015 Document Reviewed: 11/14/2014 Elsevier Interactive Patient  Education [employer].

## 2016-02-02 NOTE — Assessment & Plan Note (Signed)
She notes becoming more anxious when her chest pain is occuring. Then her blood pressure increases. May use Alprazolam prn when that occurs.

## 2016-02-02 NOTE — Assessment & Plan Note (Signed)
Atypical chest pain likely from reflux, may use NTG prn and Maalox. Also given Hyoscyamine to use prn.

## 2016-02-02 NOTE — Progress Notes (Signed)
Patient ID: Megan May, female   DOB: October 02, 1960, 55 y.o.   MRN: [id number]   Subjective:    Patient ID: Megan May, female    DOB: Aug 05, 1960, 55 y.o.   MRN: [id number]  Chief Complaint  Patient presents with  . Follow-up    HPI Patient is in today for follow up. Continues to struggle with dyspepsia and chest discomfort. Sometimes pain is fleeting and sometimes it lasts for hours. Blood pressure rises to 140-160 when this occurs. Diastolic stays int he [id number] and 80s. No palpitations. Pain wakes her at times. Has some SOB with exertion but albuterol helps. Denies palp/HA/congestion/fevers or GU c/o. Taking meds as prescribed. Still struggling with anxiety  Past Medical History  Diagnosis Date  . Hypertension   . Migraine   . Anxiety   . Eosinophilic esophagitis   . Asthma   . Posttraumatic stress disorder   . Generalized anxiety disorder   . Migraines   . Hypersomnia, recurrent   . Depression     history of     Past Surgical History  Procedure Laterality Date  . Tonsillectomy and adenoidectomy  1969  . Fracture surgery  1973    R arm  . Cesarean section  2004  . Laparotomy Bilateral 06/14/2014    Procedure: LAPAROTOMY with right  salpingo-oophorectomy and left salpingectomy;  Surgeon: Linda Hedges, DO;  Location: [facility];  Service: Gynecology;  Laterality: Bilateral;    Family History  Problem Relation Age of Onset  . Hypertension Father   . Heart disease Father   . Diabetes Father   . Stroke Maternal Grandfather   . Stroke Maternal Grandmother   . Diabetes Paternal Grandmother     Social History   Social History  . Marital Status: Married    Spouse Name: Randall Hiss  . Number of Children: 1  . Years of Education: 2 Masters   Occupational History  . SPECIAL EDUCATOR     Teacher, early years/pre   Social History Main Topics  . Smoking status: Never Smoker   . Smokeless tobacco: Never Used  . Alcohol Use: No  . Drug Use: No  . Sexual Activity:  Yes    Birth Control/ Protection: Surgical   Other Topics Concern  . Not on file   Social History Narrative   Patient is right handed, resides in home with husband and daughter. She consumes 16 oz of tea daily.   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    Outpatient Prescriptions Prior to Visit  Medication Sig Dispense Refill  . albuterol (PROVENTIL HFA;VENTOLIN HFA) 108 (90 BASE) MCG/ACT inhaler Inhale 1-2 puffs into the lungs every 6 (six) hours as needed for wheezing or shortness of breath.    . ALPRAZolam (XANAX) 0.5 MG tablet Take 0.5 mg by mouth 2 (two) times daily as needed for anxiety.     Marland Kitchen diltiazem (CARDIZEM CD) 300 MG 24 hr capsule Take 300 mg by mouth daily.    Marland Kitchen eletriptan (RELPAX) 40 MG tablet Take one at onset of hadache.   may repeat in 2 hours one time only if necessary 10 tablet 0  . metoprolol succinate (TOPROL-XL) 50 MG 24 hr tablet Take 1 tablet (50 mg total) by mouth daily. Take with or immediately following a meal. 90 tablet 3  . nitroGLYCERIN (NITROSTAT) 0.4 MG SL tablet Place 1 tablet (0.4 mg total) under the tongue every 5 (five) minutes as needed for chest pain. 25 tablet 3  .  aspirin EC 81 MG tablet Take 1 tablet (81 mg total) by mouth daily.     No facility-administered medications prior to visit.    Allergies  Allergen Reactions  . Amoxicillin-Pot Clavulanate Rash  . Levaquin [Levofloxacin In D5w] Other (See Comments)    tendonopathy  . Clindamycin/Lincomycin Itching  . Lactose Intolerance (Gi) Nausea Only  . Aspirin Other (See Comments)    Migraine headache  . Latex Rash  . Penicillins Swelling and Rash    Review of Systems  Constitutional: Positive for malaise/fatigue. Negative for fever.  HENT: Negative for congestion.   Eyes: Negative for blurred vision.  Respiratory: Positive for shortness of breath.   Cardiovascular: Positive for chest pain. Negative for palpitations and leg swelling.  Gastrointestinal: Positive for heartburn.  Negative for nausea, abdominal pain and blood in stool.  Genitourinary: Negative for dysuria and frequency.  Musculoskeletal: Negative for falls.  Skin: Negative for rash.  Neurological: Negative for dizziness, loss of consciousness and headaches.  Endo/Heme/Allergies: Negative for environmental allergies.  Psychiatric/Behavioral: Negative for depression. The patient is nervous/anxious.        Objective:    Physical Exam  Constitutional: She is oriented to person, place, and time. She appears well-developed and well-nourished. No distress.  HENT:  Head: Normocephalic and atraumatic.  Nose: Nose normal.  Eyes: Right eye exhibits no discharge. Left eye exhibits no discharge.  Neck: Normal range of motion. Neck supple.  Cardiovascular: Normal rate and regular rhythm.   No murmur heard. Pulmonary/Chest: Effort normal and breath sounds normal.  Abdominal: Soft. Bowel sounds are normal. There is no tenderness.  Musculoskeletal: She exhibits no edema.  Neurological: She is alert and oriented to person, place, and time.  Skin: Skin is warm and dry.  Psychiatric: She has a normal mood and affect.  Nursing note and vitals reviewed.   BP 120/72 mmHg  Pulse 55  Temp(Src) 98.5 F (36.9 C) (Oral)  Ht 5\' 7"  (1.702 m)  Wt 186 lb 4 oz (84.482 kg)  BMI 29.16 kg/m2  SpO2 97%  LMP 05/10/2014 (Approximate) Wt Readings from Last 3 Encounters:  01/28/16 186 lb 4 oz (84.482 kg)  01/07/16 186 lb (84.369 kg)  01/04/16 180 lb (81.647 kg)     Lab Results  Component Value Date   WBC 7.2 01/04/2016   HGB 14.5 01/04/2016   HCT 42.1 01/04/2016   PLT 219 01/04/2016   GLUCOSE 93 01/04/2016   CHOL 214* 11/27/2013   TRIG 140 11/27/2013   HDL 61 11/27/2013   LDLCALC 125* 11/27/2013   ALT 9 03/08/2015   AST 12 03/08/2015   NA 140 01/04/2016   K 3.5 01/04/2016   CL 107 01/04/2016   CREATININE 0.68 01/04/2016   BUN 11 01/04/2016   CO2 25 01/04/2016   TSH 2.582 11/27/2013    Lab Results    Component Value Date   TSH 2.582 11/27/2013   Lab Results  Component Value Date   WBC 7.2 01/04/2016   HGB 14.5 01/04/2016   HCT 42.1 01/04/2016   MCV 82.1 01/04/2016   PLT 219 01/04/2016   Lab Results  Component Value Date   NA 140 01/04/2016   K 3.5 01/04/2016   CO2 25 01/04/2016   GLUCOSE 93 01/04/2016   BUN 11 01/04/2016   CREATININE 0.68 01/04/2016   BILITOT 0.8 03/08/2015   ALKPHOS 84 03/08/2015   AST 12 03/08/2015   ALT 9 03/08/2015   PROT 6.7 03/08/2015   ALBUMIN 4.1 03/08/2015  CALCIUM 10.0 01/04/2016   ANIONGAP 8 01/04/2016   GFR 101.26 03/08/2015   Lab Results  Component Value Date   CHOL 214* 11/27/2013   Lab Results  Component Value Date   HDL 61 11/27/2013   Lab Results  Component Value Date   LDLCALC 125* 11/27/2013   Lab Results  Component Value Date   TRIG 140 11/27/2013   Lab Results  Component Value Date   CHOLHDL 3.5 11/27/2013   No results found for: HGBA1C     Assessment & Plan:   Problem List Items Addressed This Visit    Essential hypertension, benign - Primary    Well controlled, no changes to meds. Encouraged heart healthy diet such as the DASH diet and exercise as tolerated.       Anxiety    She notes becoming more anxious when her chest pain is occuring. Then her blood pressure increases. May use Alprazolam prn when that occurs.       Eosinophilic esophagitis    Atypical chest pain likely from reflux, may use NTG prn and Maalox. Also given Hyoscyamine to use prn.         I have discontinued Ms. Malone Birnbach's aspirin EC. I am also having her start on hyoscyamine. Additionally, I am having her maintain her ALPRAZolam, eletriptan, albuterol, diltiazem, metoprolol succinate, and nitroGLYCERIN.  Meds ordered this encounter  Medications  . hyoscyamine (LEVSIN SL) 0.125 MG SL tablet    Sig: Place 1 tablet (0.125 mg total) under the tongue every 4 (four) hours as needed.    Dispense:  30 tablet    Refill:  0      Penni Homans, MD

## 2016-02-02 NOTE — Assessment & Plan Note (Signed)
Well controlled, no changes to meds. Encouraged heart healthy diet such as the DASH diet and exercise as tolerated.  °

## 2016-02-10 ENCOUNTER — Telehealth: Payer: Self-pay | Admitting: Family Medicine

## 2016-02-10 MED ORDER — DILTIAZEM HCL ER COATED BEADS 300 MG PO CP24: 300.0000 mg | ORAL_CAPSULE | Freq: Every day | ORAL | Status: DC

## 2016-02-10 NOTE — Telephone Encounter (Signed)
Refill done.  

## 2016-02-10 NOTE — Telephone Encounter (Signed)
Relation to [id number] Call back number:[phone] Pharmacy: [place], [place]. [phone] (Phone) [phone] (Fax)        Reason for call:  Patient requesting a refill diltiazem (CARDIZEM CD) 300 MG 24 hr capsule

## 2016-02-14 ENCOUNTER — Other Ambulatory Visit: Payer: Self-pay | Admitting: Family Medicine

## 2016-02-14 ENCOUNTER — Encounter: Payer: Self-pay | Admitting: Family Medicine

## 2016-02-14 DIAGNOSIS — I10 Essential (primary) hypertension: Secondary | ICD-10-CM

## 2016-02-14 MED ORDER — TRIAMTERENE-HCTZ 37.5-25 MG PO TABS: 1.0000 | ORAL_TABLET | Freq: Every day | ORAL | Status: DC

## 2016-02-27 ENCOUNTER — Ambulatory Visit (INDEPENDENT_AMBULATORY_CARE_PROVIDER_SITE_OTHER): Payer: BC Managed Care – PPO | Admitting: Cardiovascular Disease

## 2016-02-27 ENCOUNTER — Encounter: Payer: Self-pay | Admitting: Cardiovascular Disease

## 2016-02-27 VITALS — BP 128/86 | Ht 67.0 in | Wt 179.2 lb

## 2016-02-27 DIAGNOSIS — R072 Precordial pain: Secondary | ICD-10-CM | POA: Diagnosis not present

## 2016-02-27 DIAGNOSIS — R0789 Other chest pain: Secondary | ICD-10-CM

## 2016-02-27 DIAGNOSIS — R0600 Dyspnea, unspecified: Secondary | ICD-10-CM

## 2016-02-27 DIAGNOSIS — R0602 Shortness of breath: Secondary | ICD-10-CM

## 2016-02-27 DIAGNOSIS — I10 Essential (primary) hypertension: Secondary | ICD-10-CM | POA: Diagnosis not present

## 2016-02-27 NOTE — Addendum Note (Signed)
Addended by: Alvina Filbert B on: 02/27/2016 10:54 AM   Modules accepted: Orders

## 2016-02-27 NOTE — Progress Notes (Signed)
Cardiology Office Note   Date:  02/27/2016   ID:  Megan May, DOB 1961-02-09, MRN [id number]  PCP:  Penni Homans, MD  Cardiologist:   Skeet Latch, MD   Chief Complaint  Patient presents with  . New Patient (Initial Visit)    blood pressure, chest pain, sob worse with exertion      History of Present Illness: Megan May is a 55 y.o. female with hypertension, eosinophilic esophagitis, depression and anxiety  who presents for an evaluation of chest discomfort.  She reports one month of intermittent chest pain.  She first noted sharp, right sided chest pain.  The pain was so severe that it made her scream.  Since then she continues to have chest pain at least once per week.  It lasts between seconds to two hours.  There is associated shortness of breath.  There is no nausea or diaphoresis.  The episodes are sporadic and do not necessarily occur with exertion or after eating certain foods.  She has also noted shortness of breath when walking up stairs or when helping her daughter dress.  He daughter is 47 and had a stroke in utero.  She is now triplegic.    Megan May has been checking her BP May and notes that it averages around [id number] systolic.  However, it is intermittently as high as 140s/130s.  She was seen at the ED with an episode of chest pain.  At that time her BP was reportedly 206/113.  She notes that her BP typically improves after taking Xanax or after waiting.  One time she took an extra dose of metoprolol and then her BP got too low.  Megan May saw her PCP, Dr. Penni Homans, on 02/02/16.  Dr. Randel Pigg felt that her atypical chest pain was likely due to her esophagitis and exacerbated by her anxiety.  She recommended Xanax, hyoscyamine and Maalox prn.  She was previously prescribed metoprolol but developed weight gain and edema.  This was switched to HCTZ-triamterene.  Since then she denies lower extremity edema, orthopnea or PND. She does note  that she has 2 cousins on her maternal side with bicuspid aortic valves and wonders if her shortness of breath could be due to this.  She has not noted any palpitations, lightheadedness, or dizziness.  Megan May. She reports having an allergic reaction to levofloxacin over a year ago. This left her with joint pain.    Past Medical History:  Diagnosis Date  . Anxiety   . Asthma   . Depression    history of   . Eosinophilic esophagitis   . Generalized anxiety disorder   . Hypersomnia, recurrent   . Hypertension   . Migraine   . Migraines   . Posttraumatic stress disorder     Past Surgical History:  Procedure Laterality Date  . CESAREAN SECTION  2004  . FRACTURE SURGERY  1973   R arm  . LAPAROTOMY Bilateral 06/14/2014   Procedure: LAPAROTOMY with right  salpingo-oophorectomy and left salpingectomy;  Surgeon: Linda Hedges, DO;  Location: [facility];  Service: Gynecology;  Laterality: Bilateral;  . TONSILLECTOMY AND ADENOIDECTOMY  1969     Current Outpatient Prescriptions  Medication Sig Dispense Refill  . albuterol (PROVENTIL HFA;VENTOLIN HFA) 108 (90 BASE) MCG/ACT inhaler Inhale 1-2 puffs into the lungs every 6 (six) hours as needed for wheezing or shortness of breath.    . ALPRAZolam (XANAX) 0.5 MG tablet Take  0.5 mg by mouth 2 (two) times daily as needed for anxiety.     Marland Kitchen diltiazem (CARDIZEM CD) 300 MG 24 hr capsule Take 1 capsule (300 mg total) by mouth daily. 90 capsule 1  . eletriptan (RELPAX) 40 MG tablet Take one at onset of hadache.   may repeat in 2 hours one time only if necessary 10 tablet 0  . hyoscyamine (LEVSIN SL) 0.125 MG SL tablet Place 1 tablet (0.125 mg total) under the tongue every 4 (four) hours as needed. 30 tablet 0  . nitroGLYCERIN (NITROSTAT) 0.4 MG SL tablet Place 1 tablet (0.4 mg total) under the tongue every 5 (five) minutes as needed for chest pain. 25 tablet 3  . triamterene-hydrochlorothiazide (MAXZIDE-25) 37.5-25  MG tablet Take 1 tablet by mouth daily. 30 tablet 2   No current facility-administered medications for this visit.     Allergies:   Amoxicillin-pot clavulanate; Levaquin [levofloxacin in d5w]; Clindamycin/lincomycin; Lactose intolerance (gi); Metoprolol; Aspirin; Latex; and Penicillins    Social History:  The patient  reports that she has never smoked. She has never used smokeless tobacco. She reports that she does not drink alcohol or use drugs.   Family History:  The patient's family history includes CAD in her father; Diabetes in her father and paternal grandmother; Heart disease in her father; Hypertension in her father; Other in her cousin; Stroke in her maternal grandfather and maternal grandmother.    ROS:  Please see the history of present illness.   Otherwise, review of systems are positive for none.   All other systems are reviewed and negative.    PHYSICAL EXAM: VS:  BP 128/86 (BP Location: Left Arm, Patient Position: Sitting, Cuff Size: Normal)   Ht 5\' 7"  (1.702 m)   Wt 179 lb 3.2 oz (81.3 kg)   LMP 05/10/2014 (Approximate)   BMI 28.07 kg/m  , BMI Body mass index is 28.07 kg/m. GENERAL:  Well appearing HEENT:  Pupils equal round and reactive, fundi not visualized, oral mucosa unremarkable NECK:  No jugular venous distention, waveform within normal limits, carotid upstroke brisk and symmetric, no bruits, no thyromegaly LYMPHATICS:  No cervical adenopathy LUNGS:  Clear to auscultation bilaterally HEART:  RRR.  PMI not displaced or sustained,S1 and S2 within normal limits, no S3, no S4, no clicks, no rubs, no murmurs ABD:  Flat, positive bowel sounds normal in frequency in pitch, no bruits, no rebound, no guarding, no midline pulsatile mass, no hepatomegaly, no splenomegaly EXT:  2 plus pulses throughout, no edema, no cyanosis no clubbing SKIN:  No rashes no nodules NEURO:  Cranial nerves II through XII grossly intact, motor grossly intact throughout PSYCH:  Cognitively  intact, oriented to person place and time    EKG:  EKG is ordered today. The ekg ordered today demonstrates Sinus bradycardia. Rate 59 bpm.  Carotid u/s 05/02/13: IMPRESSION:  1.  Trace noncalcified atherosclerotic plaque in the left carotid bifurcation resulting in less than 50% diameter stenosis.  2.  Unremarkable right carotid artery.  3.  Bilateral vertebral arteries are patent with normal antegrade flow.  Recent Labs: 03/08/2015: ALT 9 01/04/2016: BUN 11; Creatinine, Ser 0.68; Hemoglobin 14.5; Platelets 219; Potassium 3.5; Sodium 140    Lipid Panel    Component Value Date/Time   CHOL 214 (H) 11/27/2013 1008   TRIG 140 11/27/2013 1008   HDL 61 11/27/2013 1008   CHOLHDL 3.5 11/27/2013 1008   VLDL 28 11/27/2013 1008   LDLCALC 125 (H) 11/27/2013 1008  Wt Readings from Last 3 Encounters:  02/27/16 179 lb 3.2 oz (81.3 kg)  01/28/16 186 lb 4 oz (84.5 kg)  01/07/16 186 lb (84.4 kg)      ASSESSMENT AND PLAN:  # Atypical chest pain: Symptoms are atypical and seems more consistent with anxiety that ischemia.  We will obtain an ETT to ensure that she does not have ischemia.    # Hypertension: BP is at goal today.  I do not recommend that she increase her BP medication at this time.  Continue HCTZ-triamterene and diltiazem.  # Shortness of breath: She does not have any evidence of heart failure on exam.  We will obtain an echo, as she is concerned that she may have a bicuspid aortic valve and to evaluate for heart failure.   Current medicines are reviewed at length with the patient today.  The patient does not have concerns regarding medicines.  The following changes have been made:  no change  Labs/ tests ordered today include:   Orders Placed This Encounter  Procedures  . EXERCISE TOLERANCE TEST  . ECHOCARDIOGRAM COMPLETE     Disposition:   FU with Jossette Zirbel C. Oval Linsey, MD, [facility] in 1 month.    This note was written with the assistance of speech  recognition software.  Please excuse any transcriptional errors.  Signed, Tyrihanna Wingert C. Oval Linsey, MD, [facility]  02/27/2016 9:10 AM    [place]

## 2016-02-27 NOTE — Patient Instructions (Signed)
Procedures  Your physician has requested that you have an echocardiogram. Echocardiography is a painless test that uses sound waves to create images of your heart. It provides your doctor with information about the size and shape of your heart and how well your heart's chambers and valves are working. This procedure takes approximately one hour. There are no restrictions for this procedure.   Your physician has requested that you have an exercise tolerance test. For further information please visit [email or web address]. Please also follow instruction sheet, as given.   Follow-up in 1 month with Dr. Oval Linsey.     Exercise Stress Electrocardiogram An exercise stress electrocardiogram is a test to check how blood flows to your heart. It is done to find areas of poor blood flow. You will need to walk on a treadmill for this test. The electrocardiogram will record your heartbeat when you are at rest and when you are exercising. BEFORE THE PROCEDURE  Do not have drinks with caffeine or foods with caffeine for 24 hours before the test, or as told by your doctor. This includes coffee, tea (even decaf tea), sodas, chocolate, and cocoa.  Follow your doctor's instructions about eating and drinking before the test.  Ask your doctor what medicines you should or should not take before the test. Take your medicines with water unless told by your doctor not to.  If you use an inhaler, bring it with you to the test.  Bring a snack to eat after the test.  Do not  smoke for 4 hours before the test.  Do not put lotions, powders, creams, or oils on your chest before the test.  Wear comfortable shoes and clothing. PROCEDURE  You will have patches put on your chest. Small areas of your chest may need to be shaved. Wires will be connected to the patches.  Your heart rate will be watched while you are resting and while you are exercising.  You will walk on the treadmill. The treadmill will slowly get  faster to raise your heart rate.  The test will take about 1-2 hours. AFTER THE PROCEDURE  Your heart rate and blood pressure will be watched after the test.  You may return to your normal diet, activities, and medicines or as told by your doctor.   This information is not intended to replace advice given to you by your health care provider. Make sure you discuss any questions you have with your health care provider.   Document Released: 01/06/2008 Document Revised: 08/10/2014 Document Reviewed: 03/27/2013 Elsevier Interactive Patient Education [employer].

## 2016-02-28 ENCOUNTER — Ambulatory Visit (INDEPENDENT_AMBULATORY_CARE_PROVIDER_SITE_OTHER): Payer: BC Managed Care – PPO | Admitting: Physician Assistant

## 2016-02-28 ENCOUNTER — Other Ambulatory Visit (INDEPENDENT_AMBULATORY_CARE_PROVIDER_SITE_OTHER): Payer: BC Managed Care – PPO

## 2016-02-28 ENCOUNTER — Ambulatory Visit: Payer: BC Managed Care – PPO | Admitting: Family Medicine

## 2016-02-28 VITALS — BP 129/62 | HR 66

## 2016-02-28 DIAGNOSIS — I10 Essential (primary) hypertension: Secondary | ICD-10-CM | POA: Diagnosis not present

## 2016-02-28 LAB — COMPREHENSIVE METABOLIC PANEL
ALT: 13 U/L (ref 0–35)
AST: 16 U/L (ref 0–37)
Albumin: 4.5 g/dL (ref 3.5–5.2)
Alkaline Phosphatase: 86 U/L (ref 39–117)
BUN: 18 mg/dL (ref 6–23)
CALCIUM: 9.9 mg/dL (ref 8.4–10.5)
CHLORIDE: 102 meq/L (ref 96–112)
CO2: 28 meq/L (ref 19–32)
CREATININE: 0.96 mg/dL (ref 0.40–1.20)
GFR: 64.32 mL/min (ref >60.00)
GLUCOSE: 128 mg/dL — AB (ref 70–99)
Potassium: 3.4 mEq/L — ABNORMAL LOW (ref 3.5–5.1)
Sodium: 136 mEq/L (ref 135–145)
Total Bilirubin: 1 mg/dL (ref 0.2–1.2)
Total Protein: 7.2 g/dL (ref 6.0–8.3)

## 2016-02-28 NOTE — Progress Notes (Signed)
RN note reviewed. Plan is as stated. Patient with pending BMP. Will review in PCP absence and call patient.

## 2016-02-28 NOTE — Progress Notes (Signed)
Pre visit review using our clinic review tool, if applicable. No additional management support is needed unless otherwise documented below in the visit note.  Patient presents in office for blood pressure check per Patient Email note on 02/14/16. Reviewed medications with the patient. Today's readings were as follow: BP 129/62 P 66.  Per Elyn Aquas, PA-C: Continue current medication regimen and follow-up with PCP in 3 months. Lab results for CMP pending. Will call later with further recommendation.  Informed patient of the provider's instructions. She verbalized understanding and did not have any questions or concerns before leaving the nurse visit.

## 2016-02-28 NOTE — Patient Instructions (Addendum)
Per Elyn Aquas, PA-C: Continue current medication regimen and follow-up with PCP in 3 months. Lab results for CMP pending. Will call later with further recommendation.

## 2016-03-02 ENCOUNTER — Encounter: Payer: Self-pay | Admitting: Family Medicine

## 2016-03-02 NOTE — Telephone Encounter (Signed)
Dr. Charlett Blake, this is her lab results and recommendations from [place]:  Notes Recorded by Brunetta Jeans, PA-C on 02/28/2016 at 1:16 PM EDT Labs stable overall. Potassium slightly low at 3.4 (normal 3.5-5.1) so no worried. Would have her increase her dietary intake of potassium. FU with Dr. Charlett Blake 1 month.    Ref Range & Units 3d ago (02/28/16) 51mo ago (01/04/16) 60mo ago (03/08/15) 9yr ago (06/15/14) 4yr ago (06/13/14) 24yr ago (11/27/13) 46yr ago (04/25/13)   Sodium 135 - 145 mEq/L 136 140R 138 140R 139R 139 142   Potassium 3.5 - 5.1 mEq/L 3.4  3.5R 3.7 3.9R 4.1R 4.1R 3.4    Chloride 96 - 112 mEq/L 102 107R 106 105 102 103 106   CO2 19 - 32 mEq/L 28 25R 26 24 25 25     Glucose, Bld 70 - 99 mg/dL 128  93R 90 123  106  79 111    BUN 6 - 23 mg/dL 18 11R 10 7 11 12 12    Creatinine, Ser 0.40 - 1.20 mg/dL 0.96 0.68R 0.65 0.65R 0.67R 0.70R 0.90R   Total Bilirubin 0.2 - 1.2 mg/dL 1.0  0.8  1.2R 1.2    Alkaline Phosphatase 39 - 117 U/L 86  84  76 70    AST 0 - 37 U/L 16  12  17 18     ALT 0 - 35 U/L 13  9  13 14     Total Protein 6.0 - 8.3 g/dL 7.2  6.7  6.9 6.8    Albumin 3.5 - 5.2 g/dL 4.5  4.1  3.9 4.4    Calcium 8.4 - 10.5 mg/dL 9.9 10.0R 9.4 9.2 9.0 9.7    GFR >60.00 mL/min 64.32  101.26      Resulting Agency

## 2016-03-03 ENCOUNTER — Ambulatory Visit (INDEPENDENT_AMBULATORY_CARE_PROVIDER_SITE_OTHER): Payer: BC Managed Care – PPO | Admitting: Family Medicine

## 2016-03-03 ENCOUNTER — Encounter: Payer: Self-pay | Admitting: Family Medicine

## 2016-03-03 VITALS — BP 122/84 | HR 58 | Temp 98.0°F | Ht 67.0 in | Wt 182.5 lb

## 2016-03-03 DIAGNOSIS — R739 Hyperglycemia, unspecified: Secondary | ICD-10-CM

## 2016-03-03 DIAGNOSIS — K635 Polyp of colon: Secondary | ICD-10-CM

## 2016-03-03 DIAGNOSIS — I10 Essential (primary) hypertension: Secondary | ICD-10-CM | POA: Diagnosis not present

## 2016-03-03 DIAGNOSIS — D72819 Decreased white blood cell count, unspecified: Secondary | ICD-10-CM

## 2016-03-03 DIAGNOSIS — K200 Eosinophilic esophagitis: Secondary | ICD-10-CM

## 2016-03-03 NOTE — Progress Notes (Signed)
Pre visit review using our clinic review tool, if applicable. No additional management support is needed unless otherwise documented below in the visit note. 

## 2016-03-03 NOTE — Patient Instructions (Signed)

## 2016-03-04 LAB — HEMOGLOBIN A1C: Hgb A1c MFr Bld: 5.4 % (ref 4.6–6.5)

## 2016-03-09 ENCOUNTER — Other Ambulatory Visit: Payer: Self-pay

## 2016-03-09 ENCOUNTER — Ambulatory Visit (HOSPITAL_COMMUNITY): Payer: BC Managed Care – PPO | Attending: Cardiovascular Disease

## 2016-03-09 DIAGNOSIS — R0600 Dyspnea, unspecified: Secondary | ICD-10-CM | POA: Diagnosis not present

## 2016-03-09 DIAGNOSIS — I071 Rheumatic tricuspid insufficiency: Secondary | ICD-10-CM | POA: Diagnosis not present

## 2016-03-09 DIAGNOSIS — I340 Nonrheumatic mitral (valve) insufficiency: Secondary | ICD-10-CM | POA: Insufficient documentation

## 2016-03-11 ENCOUNTER — Telehealth: Payer: Self-pay | Admitting: Cardiovascular Disease

## 2016-03-11 NOTE — Telephone Encounter (Signed)
No answer. Left detailed message that Dr Oval Linsey is still in the process of reviewing the echo and will forward to Dr Oval Linsey and her nurse and we will call her back once we have the results.

## 2016-03-11 NOTE — Telephone Encounter (Signed)
Megan May is calling to get results of her Echo done on Monday ..please call    Thanks

## 2016-03-15 ENCOUNTER — Encounter: Payer: Self-pay | Admitting: Family Medicine

## 2016-03-15 DIAGNOSIS — R739 Hyperglycemia, unspecified: Secondary | ICD-10-CM

## 2016-03-15 HISTORY — DX: Hyperglycemia, unspecified: R73.9

## 2016-03-15 NOTE — Assessment & Plan Note (Signed)
hgba1c acceptable, minimize simple carbs. Increase exercise as tolerated. Continue current meds 

## 2016-03-15 NOTE — Assessment & Plan Note (Signed)
Well controlled, no changes to meds. Encouraged heart healthy diet such as the DASH diet and exercise as tolerated.  °

## 2016-03-15 NOTE — Progress Notes (Signed)
Patient ID: Megan May, female   DOB: 1961/01/12, 55 y.o.   MRN: [id number]   Subjective:    Patient ID: Megan May, female    DOB: 1961-06-14, 55 y.o.   MRN: [id number]  Chief Complaint  Patient presents with  . Follow-up    HPI Patient is in today for follow up. She is worried about her mildly elevated sugar but she denies polyuria or polydipsia. No recent illness or acute concerns. Denies CP/palp/SOB/HA/congestion/fevers/GI or GU c/o. Taking meds as prescribed  Past Medical History:  Diagnosis Date  . Anxiety   . Asthma   . Depression    history of   . Eosinophilic esophagitis   . Generalized anxiety disorder   . Hyperglycemia 03/15/2016  . Hypersomnia, recurrent   . Hypertension   . Migraine   . Migraines   . Posttraumatic stress disorder     Past Surgical History:  Procedure Laterality Date  . CESAREAN SECTION  2004  . FRACTURE SURGERY  1973   R arm  . LAPAROTOMY Bilateral 06/14/2014   Procedure: LAPAROTOMY with right  salpingo-oophorectomy and left salpingectomy;  Surgeon: Linda Hedges, DO;  Location: [facility];  Service: Gynecology;  Laterality: Bilateral;  . TONSILLECTOMY AND ADENOIDECTOMY  1969    Family History  Problem Relation Age of Onset  . Hypertension Father   . Heart disease Father   . Diabetes Father   . CAD Father   . Stroke Maternal Grandfather   . Stroke Maternal Grandmother   . Diabetes Paternal Grandmother   . Other Cousin     bicuspid aortic valve  . Diabetes Brother     Social History   Social History  . Marital status: Married    Spouse name: Randall Hiss  . Number of children: 1  . Years of education: 2 Masters   Occupational History  . [employer]    [employer]   Social History Main Topics  . Smoking status: Never Smoker  . Smokeless tobacco: Never Used  . Alcohol use No  . Drug use: No  . Sexual activity: Yes    Birth control/ protection: Surgical   Other Topics  Concern  . Not on file   Social History Narrative   Patient is right handed, resides in home with husband and daughter. She consumes 16 oz of tea daily.   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    Outpatient Medications Prior to Visit  Medication Sig Dispense Refill  . albuterol (PROVENTIL HFA;VENTOLIN HFA) 108 (90 BASE) MCG/ACT inhaler Inhale 1-2 puffs into the lungs every 6 (six) hours as needed for wheezing or shortness of breath.    . ALPRAZolam (XANAX) 0.5 MG tablet Take 0.5 mg by mouth 2 (two) times daily as needed for anxiety.     Marland Kitchen diltiazem (CARDIZEM CD) 300 MG 24 hr capsule Take 1 capsule (300 mg total) by mouth daily. 90 capsule 1  . eletriptan (RELPAX) 40 MG tablet Take one at onset of hadache.   may repeat in 2 hours one time only if necessary 10 tablet 0  . hyoscyamine (LEVSIN SL) 0.125 MG SL tablet Place 1 tablet (0.125 mg total) under the tongue every 4 (four) hours as needed. 30 tablet 0  . nitroGLYCERIN (NITROSTAT) 0.4 MG SL tablet Place 1 tablet (0.4 mg total) under the tongue every 5 (five) minutes as needed for chest pain. 25 tablet 3  . triamterene-hydrochlorothiazide (MAXZIDE-25) 37.5-25 MG tablet Take  1 tablet by mouth daily. 30 tablet 2   No facility-administered medications prior to visit.     Allergies  Allergen Reactions  . Amoxicillin-Pot Clavulanate Rash  . Levaquin [Levofloxacin In D5w] Other (See Comments)    tendonopathy  . Clindamycin/Lincomycin Itching  . Lactose Intolerance (Gi) Nausea Only  . Metoprolol     Weight gain/fluid retention   . Aspirin Other (See Comments)    Migraine headache  . Latex Rash  . Penicillins Swelling and Rash    Review of Systems  Constitutional: Negative for diaphoresis, fever and malaise/fatigue.  HENT: Negative for congestion.   Eyes: Negative for blurred vision.  Respiratory: Negative for shortness of breath.   Cardiovascular: Negative for chest pain, palpitations and leg swelling.  Gastrointestinal:  Negative for abdominal pain, blood in stool and nausea.  Genitourinary: Negative for dysuria and frequency.  Musculoskeletal: Negative for falls.  Skin: Negative for rash.  Neurological: Negative for dizziness, loss of consciousness and headaches.  Endo/Heme/Allergies: Negative for environmental allergies.  Psychiatric/Behavioral: Negative for depression. The patient is not nervous/anxious.        Objective:    Physical Exam  Constitutional: She is oriented to person, place, and time. She appears well-developed and well-nourished. No distress.  HENT:  Head: Normocephalic and atraumatic.  Nose: Nose normal.  Eyes: Right eye exhibits no discharge. Left eye exhibits no discharge.  Neck: Normal range of motion. Neck supple.  Cardiovascular: Normal rate and regular rhythm.   No murmur heard. Pulmonary/Chest: Effort normal and breath sounds normal.  Abdominal: Soft. Bowel sounds are normal. There is no tenderness.  Musculoskeletal: She exhibits no edema.  Neurological: She is alert and oriented to person, place, and time.  Skin: Skin is warm and dry.  Psychiatric: She has a normal mood and affect.  Nursing note and vitals reviewed.   BP 122/84 (BP Location: Left Arm, Patient Position: Sitting, Cuff Size: Normal)   Pulse (!) 58   Temp 98 F (36.7 C) (Oral)   Ht 5\' 7"  (1.702 m)   Wt 182 lb 8 oz (82.8 kg)   LMP 05/10/2014 (Approximate)   SpO2 97%   BMI 28.58 kg/m  Wt Readings from Last 3 Encounters:  03/03/16 182 lb 8 oz (82.8 kg)  02/27/16 179 lb 3.2 oz (81.3 kg)  01/28/16 186 lb 4 oz (84.5 kg)     Lab Results  Component Value Date   WBC 7.2 01/04/2016   HGB 14.5 01/04/2016   HCT 42.1 01/04/2016   PLT 219 01/04/2016   GLUCOSE 128 (H) 02/28/2016   CHOL 214 (H) 11/27/2013   TRIG 140 11/27/2013   HDL 61 11/27/2013   LDLCALC 125 (H) 11/27/2013   ALT 13 02/28/2016   AST 16 02/28/2016   NA 136 02/28/2016   K 3.4 (L) 02/28/2016   CL 102 02/28/2016   CREATININE 0.96  02/28/2016   BUN 18 02/28/2016   CO2 28 02/28/2016   TSH 2.582 11/27/2013   HGBA1C 5.4 03/03/2016    Lab Results  Component Value Date   TSH 2.582 11/27/2013   Lab Results  Component Value Date   WBC 7.2 01/04/2016   HGB 14.5 01/04/2016   HCT 42.1 01/04/2016   MCV 82.1 01/04/2016   PLT 219 01/04/2016   Lab Results  Component Value Date   NA 136 02/28/2016   K 3.4 (L) 02/28/2016   CO2 28 02/28/2016   GLUCOSE 128 (H) 02/28/2016   BUN 18 02/28/2016   CREATININE 0.96 02/28/2016  BILITOT 1.0 02/28/2016   ALKPHOS 86 02/28/2016   AST 16 02/28/2016   ALT 13 02/28/2016   PROT 7.2 02/28/2016   ALBUMIN 4.5 02/28/2016   CALCIUM 9.9 02/28/2016   ANIONGAP 8 01/04/2016   GFR 64.32 02/28/2016   Lab Results  Component Value Date   CHOL 214 (H) 11/27/2013   Lab Results  Component Value Date   HDL 61 11/27/2013   Lab Results  Component Value Date   LDLCALC 125 (H) 11/27/2013   Lab Results  Component Value Date   TRIG 140 11/27/2013   Lab Results  Component Value Date   CHOLHDL 3.5 11/27/2013   Lab Results  Component Value Date   HGBA1C 5.4 03/03/2016       Assessment & Plan:   Problem List Items Addressed This Visit    Essential hypertension, benign    Well controlled, no changes to meds. Encouraged heart healthy diet such as the DASH diet and exercise as tolerated.       Relevant Orders   TSH   CBC   Lipid panel   Comprehensive metabolic panel   Eosinophilic esophagitis   Colon polyp   Leukocytopenia   Hyperglycemia - Primary    hgba1c acceptable, minimize simple carbs. Increase exercise as tolerated. Continue current meds      Relevant Orders   Hemoglobin A1c (Completed)   Hemoglobin A1c    Other Visit Diagnoses   None.     I am having Ms. Harley Alto maintain her ALPRAZolam, eletriptan, albuterol, nitroGLYCERIN, hyoscyamine, diltiazem, and triamterene-hydrochlorothiazide.  No orders of the defined types were placed in this  encounter.    Penni Homans, MD

## 2016-03-18 ENCOUNTER — Telehealth (HOSPITAL_COMMUNITY): Payer: Self-pay

## 2016-03-18 NOTE — Telephone Encounter (Signed)
Encounter complete. 

## 2016-03-19 ENCOUNTER — Inpatient Hospital Stay (HOSPITAL_COMMUNITY): Admission: RE | Admit: 2016-03-19 | Payer: BC Managed Care – PPO | Source: Ambulatory Visit

## 2016-03-19 ENCOUNTER — Encounter (HOSPITAL_COMMUNITY): Payer: BC Managed Care – PPO

## 2016-03-19 DIAGNOSIS — R0989 Other specified symptoms and signs involving the circulatory and respiratory systems: Secondary | ICD-10-CM

## 2016-03-19 NOTE — Telephone Encounter (Signed)
Notes Recorded by Earvin Hansen on 03/13/2016 at 3:21 PM EDT Advised patient

## 2016-04-07 ENCOUNTER — Telehealth (HOSPITAL_COMMUNITY): Payer: Self-pay

## 2016-04-07 NOTE — Telephone Encounter (Signed)
Encounter complete. 

## 2016-04-09 ENCOUNTER — Telehealth: Payer: Self-pay | Admitting: *Deleted

## 2016-04-09 ENCOUNTER — Ambulatory Visit (HOSPITAL_COMMUNITY)
Admission: RE | Admit: 2016-04-09 | Discharge: 2016-04-09 | Disposition: A | Discharge status: Home / Self Care | Payer: BC Managed Care – PPO | Source: Ambulatory Visit | Attending: Cardiovascular Disease | Admitting: Cardiovascular Disease

## 2016-04-09 DIAGNOSIS — R072 Precordial pain: Secondary | ICD-10-CM

## 2016-04-09 MED ORDER — LISINOPRIL 20 MG PO TABS: ORAL_TABLET | ORAL | 5 refills | Status: DC

## 2016-04-09 NOTE — Telephone Encounter (Signed)
Patient in office for ETT and blood pressure elevated Per Dr Oval Linsey have patient take Lisinopril 20 mg now and then one daily. Return next week for blood pressure with Pharm D Advised patient and scheduled, verbalized understanding  Originally sent to [facility] but patient wanted sent to [facility] [phone]

## 2016-04-16 ENCOUNTER — Ambulatory Visit (INDEPENDENT_AMBULATORY_CARE_PROVIDER_SITE_OTHER): Payer: BC Managed Care – PPO | Admitting: Pharmacist

## 2016-04-16 ENCOUNTER — Encounter: Payer: Self-pay | Admitting: Pharmacist

## 2016-04-16 VITALS — BP 142/76 | HR 67

## 2016-04-16 DIAGNOSIS — I10 Essential (primary) hypertension: Secondary | ICD-10-CM | POA: Diagnosis not present

## 2016-04-16 MED ORDER — HYDROCHLOROTHIAZIDE 25 MG PO TABS: 25.0000 mg | ORAL_TABLET | Freq: Every day | ORAL | 0 refills | Status: DC

## 2016-04-16 NOTE — Progress Notes (Signed)
Patient ID: Megan May                 DOB: 02-10-1961                      MRN: [id number]     HPI: Megan May is a 55 y.o. female patient of Dr. Oval Linsey with PMH below who presents today for hypertension evaluation. She was recently started on lisinopril 20mg  daily about 1 week ago.  She reports that she occasionally has some dizziness on the lisinopril, but this is tolerable. She does not wish to change medications based on this.   She states her internist has asked that she come off the triamterene due to diabetes risk. They told her it was ok for her to be on the hydrochlorothiazide however. She has been retaining fluid since stopping maxide completely. She took Maxide PRN for a weeks then discontinued altogether around the time that she called about her pressure being high.   Cardiac Hx: HTN, migraines, pre-diabetes  Current HTN meds:  Diltiazem 300mg  daily Qam Lisinopril 20mg  daily QAM  Triamterene-hydrochlorothiazide 37.5/25mg  daily - she has not been taking this medication due to DM risk  Previously tried:  Metoprolol - cause her to be bloated   BP goal: <140/90  Family History: Her father had cardiac disease.   Social History: She denies tobacco products and alcohol.   Diet: She is a vegetarian. She does not use salt when cooking or at the table. She has completely cut out coffee and tea and does not drink soda or other caffeinated beverages.   Exercise: She does not exercise due to an allergic reaction to levaquin a few years back.  Home BP readings: She did not bring a log with her but reports her pressure usually runs in the 145-150/65-75 range.   Wt Readings from Last 3 Encounters:  03/03/16 182 lb 8 oz (82.8 kg)  02/27/16 179 lb 3.2 oz (81.3 kg)  01/28/16 186 lb 4 oz (84.5 kg)   BP Readings from Last 3 Encounters:  04/16/16 (!) 142/76  03/03/16 122/84  02/28/16 129/62   Pulse Readings from Last 3 Encounters:  04/16/16 67  03/03/16  (!) 58  02/28/16 66    Renal function: CrCl cannot be calculated (Unknown ideal weight.).  Past Medical History:  Diagnosis Date  . Anxiety   . Asthma   . Depression    history of   . Eosinophilic esophagitis   . Generalized anxiety disorder   . Hyperglycemia 03/15/2016  . Hypersomnia, recurrent   . Hypertension   . Migraine   . Migraines   . Posttraumatic stress disorder     Current Outpatient Prescriptions on File Prior to Visit  Medication Sig Dispense Refill  . albuterol (PROVENTIL HFA;VENTOLIN HFA) 108 (90 BASE) MCG/ACT inhaler Inhale 1-2 puffs into the lungs every 6 (six) hours as needed for wheezing or shortness of breath.    . ALPRAZolam (XANAX) 0.5 MG tablet Take 0.5 mg by mouth 2 (two) times daily as needed for anxiety.     Marland Kitchen diltiazem (CARDIZEM CD) 300 MG 24 hr capsule Take 1 capsule (300 mg total) by mouth daily. 90 capsule 1  . lisinopril (PRINIVIL,ZESTRIL) 20 MG tablet 1 tablet by mouth now and then 1 tablet daily 30 tablet 5  . eletriptan (RELPAX) 40 MG tablet Take one at onset of hadache.   may repeat in 2 hours one time only if necessary (Patient  not taking: Reported on 04/16/2016) 10 tablet 0  . hyoscyamine (LEVSIN SL) 0.125 MG SL tablet Place 1 tablet (0.125 mg total) under the tongue every 4 (four) hours as needed. (Patient not taking: Reported on 04/16/2016) 30 tablet 0  . nitroGLYCERIN (NITROSTAT) 0.4 MG SL tablet Place 1 tablet (0.4 mg total) under the tongue every 5 (five) minutes as needed for chest pain. (Patient not taking: Reported on 04/16/2016) 25 tablet 3  . [DISCONTINUED] DULoxetine (CYMBALTA) 30 MG capsule Take 60 mg by mouth daily.     . [DISCONTINUED] topiramate (TOPAMAX) 25 MG capsule Take 75 mg by mouth daily.      No current facility-administered medications on file prior to visit.     Allergies  Allergen Reactions  . Amoxicillin-Pot Clavulanate Rash  . Levaquin [Levofloxacin In D5w] Other (See Comments)    tendonopathy  .  Clindamycin/Lincomycin Itching  . Lactose Intolerance (Gi) Nausea Only  . Metoprolol     Weight gain/fluid retention   . Aspirin Other (See Comments)    Migraine headache  . Latex Rash  . Penicillins Swelling and Rash    Blood pressure (!) 142/76, pulse 67, last menstrual period 05/10/2014.   Assessment/Plan: Hypertension: BP today is not quite to goal. Will restart her HCTZ only at 25mg  daily. She already has a follow up visit schedule with Dr. Oval Linsey for next week. Recommend she have a BMET but will wait to order in case Dr. Oval Linsey would like additional labs at that time. Follow up with hypertension clinic if needed.   Thank you, Lelan Pons. Patterson Hammersmith, [facility]  04/17/2016 3:42 PM

## 2016-04-16 NOTE — Patient Instructions (Addendum)
Return for a follow up appointment with Dr Oval Linsey as schedule  Check your blood pressure at home daily (if able) and keep record of the readings.  Take your BP meds as follows: Start taking Hydrochlorothiade 25mg  daily   Bring all of your meds, your BP cuff and your record of home blood pressures to your next appointment.  Exercise as you're able, try to walk approximately 30 minutes per day.  Keep salt intake to a minimum, especially watch canned and prepared boxed foods.  Eat more fresh fruits and vegetables and fewer canned items.  Avoid eating in fast food restaurants.    HOW TO TAKE YOUR BLOOD PRESSURE: . Rest 5 minutes before taking your blood pressure. .  Don't smoke or drink caffeinated beverages for at least 30 minutes before. . Take your blood pressure before (not after) you eat. . Sit comfortably with your back supported and both feet on the floor (don't cross your legs). . Elevate your arm to heart level on a table or a desk. . Use the proper sized cuff. It should fit smoothly and snugly around your bare upper arm. There should be enough room to slip a fingertip under the cuff. The bottom edge of the cuff should be 1 inch above the crease of the elbow. . Ideally, take 3 measurements at one sitting and record the average.

## 2016-04-23 ENCOUNTER — Encounter: Payer: Self-pay | Admitting: Cardiovascular Disease

## 2016-04-23 ENCOUNTER — Ambulatory Visit (INDEPENDENT_AMBULATORY_CARE_PROVIDER_SITE_OTHER): Payer: BC Managed Care – PPO | Admitting: Cardiovascular Disease

## 2016-04-23 VITALS — BP 114/77 | HR 80 | Ht 67.0 in | Wt 183.2 lb

## 2016-04-23 DIAGNOSIS — Z79899 Other long term (current) drug therapy: Secondary | ICD-10-CM

## 2016-04-23 DIAGNOSIS — I10 Essential (primary) hypertension: Secondary | ICD-10-CM

## 2016-04-23 DIAGNOSIS — R0789 Other chest pain: Secondary | ICD-10-CM

## 2016-04-23 DIAGNOSIS — R0602 Shortness of breath: Secondary | ICD-10-CM | POA: Diagnosis not present

## 2016-04-23 MED ORDER — FUROSEMIDE 20 MG PO TABS: ORAL_TABLET | ORAL | 5 refills | Status: DC

## 2016-04-23 MED ORDER — ALBUTEROL SULFATE HFA 108 (90 BASE) MCG/ACT IN AERS: 1.0000 | INHALATION_SPRAY | Freq: Four times a day (QID) | RESPIRATORY_TRACT | 0 refills | Status: DC | PRN

## 2016-04-23 NOTE — Progress Notes (Signed)
Cardiology Office Note   Date:  04/23/2016   ID:  Megan May, DOB 05/25/1961, MRN [id number]  PCP:  Penni Homans, MD  Cardiologist:   Skeet Latch, MD   Chief Complaint  Patient presents with  . Follow-up    1 mo rov. tests results      History of Present Illness: Megan May is a 55 y.o. female with hypertension, eosinophilic esophagitis, depression and anxiety  who presents for follow up on chest discomfort.  She was seen 02/27/16 with chest pain and referred for ETT.  However when she got there her blood pressure was too high so it was cancelled.  She was started on lisinopril 20 mg daily.  She had dizziness but this seems to have improved.  She stopped taking HCTZ-triamterene but then noted that increased lower extremity edema.  She followed up with Tana Coast, PharmD, and was started on HCTZ.  Since taking this she notes increased chest pressure and discomfort, but the edema has improved.  This am she forgot to take her diltiazem.  She had and echo 03/09/16 that revealed LVEF 65-70% and was otherwise unremarkable.  Past Medical History:  Diagnosis Date  . Anxiety   . Asthma   . Depression    history of   . Eosinophilic esophagitis   . Generalized anxiety disorder   . Hyperglycemia 03/15/2016  . Hypersomnia, recurrent   . Hypertension   . Migraine   . Migraines   . Posttraumatic stress disorder     Past Surgical History:  Procedure Laterality Date  . CESAREAN SECTION  2004  . FRACTURE SURGERY  1973   R arm  . LAPAROTOMY Bilateral 06/14/2014   Procedure: LAPAROTOMY with right  salpingo-oophorectomy and left salpingectomy;  Surgeon: Linda Hedges, DO;  Location: [facility];  Service: Gynecology;  Laterality: Bilateral;  . TONSILLECTOMY AND ADENOIDECTOMY  1969     Current Outpatient Prescriptions  Medication Sig Dispense Refill  . albuterol (PROVENTIL HFA;VENTOLIN HFA) 108 (90 Base) MCG/ACT inhaler Inhale 1-2 puffs into the lungs every 6  (six) hours as needed for wheezing or shortness of breath. 8 g 0  . ALPRAZolam (XANAX) 0.5 MG tablet Take 0.5 mg by mouth 2 (two) times daily as needed for anxiety.     Marland Kitchen diltiazem (CARDIZEM CD) 300 MG 24 hr capsule Take 1 capsule (300 mg total) by mouth daily. 90 capsule 1  . eletriptan (RELPAX) 40 MG tablet Take one at onset of hadache.   may repeat in 2 hours one time only if necessary (Patient not taking: Reported on 04/16/2016) 10 tablet 0  . furosemide (LASIX) 20 MG tablet 1 TABLET BY MOUTH DAILY AS NEEDED 30 tablet 5  . lisinopril (PRINIVIL,ZESTRIL) 20 MG tablet 1 tablet by mouth now and then 1 tablet daily 30 tablet 5  . nitroGLYCERIN (NITROSTAT) 0.4 MG SL tablet Place 1 tablet (0.4 mg total) under the tongue every 5 (five) minutes as needed for chest pain. (Patient not taking: Reported on 04/16/2016) 25 tablet 3   No current facility-administered medications for this visit.     Allergies:   Amoxicillin-pot clavulanate; Levaquin [levofloxacin in d5w]; Clindamycin/lincomycin; Lactose intolerance (gi); Metoprolol; Aspirin; Latex; and Penicillins    Social History:  The patient  reports that she has never smoked. She has never used smokeless tobacco. She reports that she does not drink alcohol or use drugs.   Family History:  The patient's family history includes CAD in her father; Diabetes  in her brother, father, and paternal grandmother; Heart disease in her father; Hypertension in her father; Other in her cousin; Stroke in her maternal grandfather and maternal grandmother.    ROS:  Please see the history of present illness.   Otherwise, review of systems are positive for none.   All other systems are reviewed and negative.    PHYSICAL EXAM: VS:  BP 114/77   Pulse 80   Ht 5\' 7"  (1.702 m)   Wt 183 lb 3.2 oz (83.1 kg)   LMP 05/10/2014 (Approximate)   BMI 28.69 kg/m  , BMI Body mass index is 28.69 kg/m. GENERAL:  Well appearing HEENT:  Pupils equal round and reactive, fundi not  visualized, oral mucosa unremarkable NECK:  No jugular venous distention, waveform within normal limits, carotid upstroke brisk and symmetric, no bruits LYMPHATICS:  No cervical adenopathy LUNGS:  Clear to auscultation bilaterally HEART:  RRR.  PMI not displaced or sustained,S1 and S2 within normal limits, no S3, no S4, no clicks, no rubs, no murmurs ABD:  Flat, positive bowel sounds normal in frequency in pitch, no bruits, no rebound, no guarding, no midline pulsatile mass, no hepatomegaly, no splenomegaly EXT:  2 plus pulses throughout, no edema, no cyanosis no clubbing SKIN:  No rashes no nodules NEURO:  Cranial nerves II through XII grossly intact, motor grossly intact throughout PSYCH:  Cognitively intact, oriented to person place and time  EKG:  EKG is not ordered today. The ekg ordered 02/27/16 demonstrates sinus bradycardia. Rate 59 bpm.  Echo 03/09/16: Study Conclusions  - Left ventricle: The cavity size was normal. Systolic function was   vigorous. The estimated ejection fraction was in the range of 65%   to 70%. Wall motion was normal; there were no regional wall   motion abnormalities. Left ventricular diastolic function   parameters were normal. - Aortic valve: Transvalvular velocity was within the normal range.   There was no stenosis. There was no regurgitation. - Mitral valve: Transvalvular velocity was within the normal range.   There was no evidence for stenosis. There was trivial   regurgitation. - Right ventricle: The cavity size was normal. Wall thickness was   normal. Systolic function was normal. - Atrial septum: No defect or patent foramen ovale was identified   by color flow Doppler. - Tricuspid valve: There was trivial regurgitation. - Pulmonary arteries: Systolic pressure was within the normal   range. PA peak pressure: 18 mm Hg (S).  Carotid u/s 05/02/13: IMPRESSION:  1.  Trace noncalcified atherosclerotic plaque in the left carotid bifurcation  resulting in less than 50% diameter stenosis. 2.  Unremarkable right carotid artery. 3.  Bilateral vertebral arteries are patent with normal antegrade flow.  Recent Labs: 01/04/2016: Hemoglobin 14.5; Platelets 219 02/28/2016: ALT 13; BUN 18; Creatinine, Ser 0.96; Potassium 3.4; Sodium 136    Lipid Panel    Component Value Date/Time   CHOL 214 (H) 11/27/2013 1008   TRIG 140 11/27/2013 1008   HDL 61 11/27/2013 1008   CHOLHDL 3.5 11/27/2013 1008   VLDL 28 11/27/2013 1008   LDLCALC 125 (H) 11/27/2013 1008      Wt Readings from Last 3 Encounters:  04/23/16 183 lb 3.2 oz (83.1 kg)  03/03/16 182 lb 8 oz (82.8 kg)  02/27/16 179 lb 3.2 oz (81.3 kg)      ASSESSMENT AND PLAN:  # Atypical chest pain: Symptoms are atypical and seems more consistent with anxiety that ischemia. Now that her blood pressure is better-controlled we  will attempt to get her ETT again.  # Hypertension: BP is much better controlled.  However, she doesn't tolerate HCTZ.  Her blood pressure is well-controlled and she hasn't taken diltiazem yet.  We will stop the hydrochlorothiazide and start furosemide 20 mg daily. When she comes back for her ETT she will get a basic metabolic panel. Continue diltiazem.  # Shortness of breath: Echo was unremarkable. ETT is pending as above.   Current medicines are reviewed at length with the patient today.  The patient does not have concerns regarding medicines.  The following changes have been made:  no change  Labs/ tests ordered today include:   Orders Placed This Encounter  Procedures  . Basic metabolic panel  . Exercise Tolerance Test     Disposition:   FU with Ayleah Hofmeister C. Oval Linsey, MD, [facility] in 1 month.    This note was written with the assistance of speech recognition software.  Please excuse any transcriptional errors.  Signed, Davaris Youtsey C. Oval Linsey, MD, [facility]  04/23/2016 4:35 PM    [place]

## 2016-04-23 NOTE — Patient Instructions (Addendum)
Medication Instructions:  STOP HYDROCHLOROTHIAZIDE   START FUROSEMIDE 20 MG DAILY AS NEEDED   Labwork: BMET AT [facility] ON THE FIRST FLOOR WHEN YOU RETURN FOR YOUR STRESS TEST  Testing/Procedures: Your physician has requested that you have an exercise tolerance test. For further information please visit [email or web address]. Please also follow instruction sheet, as given.  Follow-Up: Your physician recommends that you schedule a follow-up appointment in: [place]  If you need a refill on your cardiac medications before your next appointment, please call your pharmacy.

## 2016-04-30 ENCOUNTER — Telehealth: Payer: Self-pay | Admitting: Cardiovascular Disease

## 2016-04-30 NOTE — Telephone Encounter (Signed)
Records received from [facility] for [facility] on 05/20/16 with Dr Oval Linsey. Records given to [employer] (medical records) CN

## 2016-05-15 ENCOUNTER — Telehealth (HOSPITAL_COMMUNITY): Payer: Self-pay

## 2016-05-15 NOTE — Telephone Encounter (Signed)
Encounter complete. 

## 2016-05-20 ENCOUNTER — Inpatient Hospital Stay (HOSPITAL_COMMUNITY): Admission: RE | Admit: 2016-05-20 | Payer: BC Managed Care – PPO | Source: Ambulatory Visit

## 2016-05-21 ENCOUNTER — Telehealth (HOSPITAL_COMMUNITY): Payer: Self-pay

## 2016-05-21 NOTE — Telephone Encounter (Signed)
Encounter complete. 

## 2016-05-26 ENCOUNTER — Ambulatory Visit (HOSPITAL_COMMUNITY)
Admission: RE | Admit: 2016-05-26 | Discharge: 2016-05-26 | Disposition: A | Discharge status: Home / Self Care | Payer: BC Managed Care – PPO | Source: Ambulatory Visit | Attending: Cardiovascular Disease | Admitting: Cardiovascular Disease

## 2016-05-26 DIAGNOSIS — I10 Essential (primary) hypertension: Secondary | ICD-10-CM | POA: Insufficient documentation

## 2016-05-26 DIAGNOSIS — R0789 Other chest pain: Secondary | ICD-10-CM | POA: Insufficient documentation

## 2016-05-26 LAB — EXERCISE TOLERANCE TEST
CHL RATE OF PERCEIVED EXERTION: 17
CSEPEDS: 0 s
CSEPEW: 11.7 METS
CSEPHR: 90 %
CSEPPHR: 150 {beats}/min
Exercise duration (min): 10 min
MPHR: 166 {beats}/min
Rest HR: 60 {beats}/min

## 2016-05-28 ENCOUNTER — Ambulatory Visit: Payer: BC Managed Care – PPO | Admitting: Cardiovascular Disease

## 2016-06-15 ENCOUNTER — Ambulatory Visit: Payer: BC Managed Care – PPO | Admitting: Family Medicine

## 2016-06-18 ENCOUNTER — Ambulatory Visit (INDEPENDENT_AMBULATORY_CARE_PROVIDER_SITE_OTHER): Payer: BC Managed Care – PPO | Admitting: Family Medicine

## 2016-06-18 ENCOUNTER — Encounter: Payer: Self-pay | Admitting: Family Medicine

## 2016-06-18 DIAGNOSIS — I10 Essential (primary) hypertension: Secondary | ICD-10-CM

## 2016-06-18 DIAGNOSIS — E782 Mixed hyperlipidemia: Secondary | ICD-10-CM | POA: Diagnosis not present

## 2016-06-18 DIAGNOSIS — R739 Hyperglycemia, unspecified: Secondary | ICD-10-CM | POA: Diagnosis not present

## 2016-06-18 NOTE — Patient Instructions (Signed)
Hypertension Hypertension, commonly called high blood pressure, is when the force of blood pumping through your arteries is too strong. Your arteries are the blood vessels that carry blood from your heart throughout your body. A blood pressure reading consists of a higher number over a lower number, such as 110/72. The higher number (systolic) is the pressure inside your arteries when your heart pumps. The lower number (diastolic) is the pressure inside your arteries when your heart relaxes. Ideally you want your blood pressure below 120/80. Hypertension forces your heart to work harder to pump blood. Your arteries may become narrow or stiff. Having untreated or uncontrolled hypertension can cause heart attack, stroke, kidney disease, and other problems. What increases the risk? Some risk factors for high blood pressure are controllable. Others are not. Risk factors you cannot control include:  Race. You may be at higher risk if you are African American.  Age. Risk increases with age.  Gender. Men are at higher risk than women before age 45 years. After age 65, women are at higher risk than men. Risk factors you can control include:  Not getting enough exercise or physical activity.  Being overweight.  Getting too much fat, sugar, calories, or salt in your diet.  Drinking too much alcohol. What are the signs or symptoms? Hypertension does not usually cause signs or symptoms. Extremely high blood pressure (hypertensive crisis) may cause headache, anxiety, shortness of breath, and nosebleed. How is this diagnosed? To check if you have hypertension, your health care provider will measure your blood pressure while you are seated, with your arm held at the level of your heart. It should be measured at least twice using the same arm. Certain conditions can cause a difference in blood pressure between your right and left arms. A blood pressure reading that is higher than normal on one occasion does  not mean that you need treatment. If it is not clear whether you have high blood pressure, you may be asked to return on a different day to have your blood pressure checked again. Or, you may be asked to monitor your blood pressure at home for 1 or more weeks. How is this treated? Treating high blood pressure includes making lifestyle changes and possibly taking medicine. Living a healthy lifestyle can help lower high blood pressure. You may need to change some of your habits. Lifestyle changes may include:  Following the DASH diet. This diet is high in fruits, vegetables, and whole grains. It is low in salt, red meat, and added sugars.  Keep your sodium intake below 2,300 mg per day.  Getting at least 30-45 minutes of aerobic exercise at least 4 times per week.  Losing weight if necessary.  Not smoking.  Limiting alcoholic beverages.  Learning ways to reduce stress. Your health care provider may prescribe medicine if lifestyle changes are not enough to get your blood pressure under control, and if one of the following is true:  You are 18-59 years of age and your systolic blood pressure is above 140.  You are 60 years of age or older, and your systolic blood pressure is above 150.  Your diastolic blood pressure is above 90.  You have diabetes, and your systolic blood pressure is over 140 or your diastolic blood pressure is over 90.  You have kidney disease and your blood pressure is above 140/90.  You have heart disease and your blood pressure is above 140/90. Your personal target blood pressure may vary depending on your medical   conditions, your age, and other factors. Follow these instructions at home:  Have your blood pressure rechecked as directed by your health care provider.  Take medicines only as directed by your health care provider. Follow the directions carefully. Blood pressure medicines must be taken as prescribed. The medicine does not work as well when you skip  doses. Skipping doses also puts you at risk for problems.  Do not smoke.  Monitor your blood pressure at home as directed by your health care provider. Contact a health care provider if:  You think you are having a reaction to medicines taken.  You have recurrent headaches or feel dizzy.  You have swelling in your ankles.  You have trouble with your vision. Get help right away if:  You develop a severe headache or confusion.  You have unusual weakness, numbness, or feel faint.  You have severe chest or abdominal pain.  You vomit repeatedly.  You have trouble breathing. This information is not intended to replace advice given to you by your health care provider. Make sure you discuss any questions you have with your health care provider. Document Released: 07/20/2005 Document Revised: 12/26/2015 Document Reviewed: 05/12/2013 Elsevier Interactive Patient Education  2017 Elsevier Inc.  

## 2016-06-18 NOTE — Progress Notes (Signed)
Pre visit review using our clinic review tool, if applicable. No additional management support is needed unless otherwise documented below in the visit note. 

## 2016-06-19 LAB — CBC
HEMATOCRIT: 38.3 % (ref 36.0–46.0)
HEMOGLOBIN: 13.2 g/dL (ref 12.0–15.0)
MCHC: 34.4 g/dL (ref 30.0–36.0)
MCV: 84.2 fl (ref 78.0–100.0)
Platelets: 231 10*3/uL (ref 150.0–400.0)
RBC: 4.56 Mil/uL (ref 3.87–5.11)
RDW: 12.8 % (ref 11.5–15.5)
WBC: 7.2 10*3/uL (ref 4.0–10.5)

## 2016-06-19 LAB — LIPID PANEL
CHOLESTEROL: 199 mg/dL (ref 0–200)
HDL: 61 mg/dL (ref >39.00)
LDL Cholesterol: 99 mg/dL (ref 0–99)
NONHDL: 137.57
Total CHOL/HDL Ratio: 3
Triglycerides: 194 mg/dL — ABNORMAL HIGH (ref 0.0–149.0)
VLDL: 38.8 mg/dL (ref 0.0–40.0)

## 2016-06-19 LAB — COMPREHENSIVE METABOLIC PANEL
ALBUMIN: 4.4 g/dL (ref 3.5–5.2)
ALK PHOS: 74 U/L (ref 39–117)
ALT: 12 U/L (ref 0–35)
AST: 17 U/L (ref 0–37)
BUN: 16 mg/dL (ref 6–23)
CO2: 28 mEq/L (ref 19–32)
CREATININE: 0.81 mg/dL (ref 0.40–1.20)
Calcium: 9.7 mg/dL (ref 8.4–10.5)
Chloride: 104 mEq/L (ref 96–112)
GFR: 78.17 mL/min (ref >60.00)
GLUCOSE: 96 mg/dL (ref 70–99)
POTASSIUM: 3.9 meq/L (ref 3.5–5.1)
SODIUM: 141 meq/L (ref 135–145)
TOTAL PROTEIN: 6.7 g/dL (ref 6.0–8.3)
Total Bilirubin: 0.8 mg/dL (ref 0.2–1.2)

## 2016-06-19 LAB — HEMOGLOBIN A1C: HEMOGLOBIN A1C: 5.3 % (ref 4.6–6.5)

## 2016-06-19 LAB — TSH: TSH: 1.15 u[IU]/mL (ref 0.35–4.50)

## 2016-06-30 ENCOUNTER — Encounter: Payer: Self-pay | Admitting: Family Medicine

## 2016-06-30 DIAGNOSIS — E782 Mixed hyperlipidemia: Secondary | ICD-10-CM | POA: Insufficient documentation

## 2016-06-30 HISTORY — DX: Mixed hyperlipidemia: E78.2

## 2016-06-30 NOTE — Progress Notes (Signed)
Patient ID: Megan May, female   DOB: 1960/12/08, 55 y.o.   MRN: [id number]   Subjective:    Patient ID: Megan May, female    DOB: [birth year], 55 y.o.   MRN: [id number]  Chief Complaint  Patient presents with  . Follow-up    HPI Patient is in today for follow up. She feels well today. No recent illness or hospitalizations. No acute concerns. Is staying active and trying to maintain a heart healthy diet. Denies CP/palp/SOB/HA/congestion/fevers/GI or GU c/o. Taking meds as prescribed  Past Medical History:  Diagnosis Date  . Anxiety   . Asthma   . Depression    history of   . Eosinophilic esophagitis   . Generalized anxiety disorder   . Hyperglycemia 03/15/2016  . Hyperlipidemia, mixed 06/30/2016  . Hypersomnia, recurrent   . Hypertension   . Migraine   . Migraines   . Posttraumatic stress disorder     Past Surgical History:  Procedure Laterality Date  . CESAREAN SECTION  2004  . FRACTURE SURGERY  1973   R arm  . LAPAROTOMY Bilateral 06/14/2014   Procedure: LAPAROTOMY with right  salpingo-oophorectomy and left salpingectomy;  Surgeon: Linda Hedges, DO;  Location: [facility];  Service: Gynecology;  Laterality: Bilateral;  . TONSILLECTOMY AND ADENOIDECTOMY  1969    Family History  Problem Relation Age of Onset  . Hypertension Father   . Heart disease Father   . Diabetes Father   . CAD Father   . Stroke Maternal Grandfather   . Stroke Maternal Grandmother   . Diabetes Paternal Grandmother   . Other Cousin     bicuspid aortic valve  . Diabetes Brother     Social History   Social History  . Marital status: Married    Spouse name: Randall Hiss  . Number of children: 1  . Years of education: 2 Masters   Occupational History  . [employer]   Social History Main Topics  . Smoking status: Never Smoker  . Smokeless tobacco: Never Used  . Alcohol use No  . Drug use: No  . Sexual activity: Yes   Birth control/ protection: Surgical   Other Topics Concern  . Not on file   Social History Narrative   Patient is right handed, resides in home with husband and daughter. She consumes 16 oz of tea daily.   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    Outpatient Medications Prior to Visit  Medication Sig Dispense Refill  . albuterol (PROVENTIL HFA;VENTOLIN HFA) 108 (90 Base) MCG/ACT inhaler Inhale 1-2 puffs into the lungs every 6 (six) hours as needed for wheezing or shortness of breath. 8 g 0  . ALPRAZolam (XANAX) 0.5 MG tablet Take 0.5 mg by mouth 2 (two) times daily as needed for anxiety.     Marland Kitchen diltiazem (CARDIZEM CD) 300 MG 24 hr capsule Take 1 capsule (300 mg total) by mouth daily. 90 capsule 1  . eletriptan (RELPAX) 40 MG tablet Take one at onset of hadache.   may repeat in 2 hours one time only if necessary 10 tablet 0  . furosemide (LASIX) 20 MG tablet 1 TABLET BY MOUTH DAILY AS NEEDED 30 tablet 5  . lisinopril (PRINIVIL,ZESTRIL) 20 MG tablet 1 tablet by mouth now and then 1 tablet daily 30 tablet 5  . nitroGLYCERIN (NITROSTAT) 0.4 MG SL tablet Place 1 tablet (0.4 mg total) under the tongue every 5 (five)  minutes as needed for chest pain. 25 tablet 3   No facility-administered medications prior to visit.     Allergies  Allergen Reactions  . Amoxicillin-Pot Clavulanate Rash  . Levaquin [Levofloxacin In D5w] Other (See Comments)    tendonopathy  . Clindamycin/Lincomycin Itching  . Lactose Intolerance (Gi) Nausea Only  . Metoprolol     Weight gain/fluid retention   . Aspirin Other (See Comments)    Migraine headache  . Latex Rash  . Penicillins Swelling and Rash    Review of Systems  Constitutional: Negative for fever and malaise/fatigue.  HENT: Negative for congestion.   Eyes: Negative for blurred vision.  Respiratory: Negative for shortness of breath.   Cardiovascular: Negative for chest pain, palpitations and leg swelling.  Gastrointestinal: Negative for  abdominal pain, blood in stool and nausea.  Genitourinary: Negative for dysuria and frequency.  Musculoskeletal: Negative for falls.  Skin: Negative for rash.  Neurological: Negative for dizziness, loss of consciousness and headaches.  Endo/Heme/Allergies: Negative for environmental allergies.  Psychiatric/Behavioral: Negative for depression. The patient is not nervous/anxious.        Objective:    Physical Exam  Constitutional: She is oriented to person, place, and time. She appears well-developed and well-nourished. No distress.  HENT:  Head: Normocephalic and atraumatic.  Nose: Nose normal.  Eyes: Right eye exhibits no discharge. Left eye exhibits no discharge.  Neck: Normal range of motion. Neck supple.  Cardiovascular: Normal rate and regular rhythm.   No murmur heard. Pulmonary/Chest: Effort normal and breath sounds normal.  Abdominal: Soft. Bowel sounds are normal. There is no tenderness.  Musculoskeletal: She exhibits no edema.  Neurological: She is alert and oriented to person, place, and time.  Skin: Skin is warm and dry.  Psychiatric: She has a normal mood and affect.  Nursing note and vitals reviewed.   BP 140/88 (BP Location: Left Arm, Patient Position: Sitting, Cuff Size: Normal)   Pulse 72   Temp 98.4 F (36.9 C) (Oral)   Ht 5\' 7"  (1.702 m)   Wt 188 lb 8 oz (85.5 kg)   LMP 05/10/2014 (Approximate)   SpO2 96%   BMI 29.52 kg/m  Wt Readings from Last 3 Encounters:  06/18/16 188 lb 8 oz (85.5 kg)  04/23/16 183 lb 3.2 oz (83.1 kg)  03/03/16 182 lb 8 oz (82.8 kg)     Lab Results  Component Value Date   WBC 7.2 06/18/2016   HGB 13.2 06/18/2016   HCT 38.3 06/18/2016   PLT 231.0 06/18/2016   GLUCOSE 96 06/18/2016   CHOL 199 06/18/2016   TRIG 194.0 (H) 06/18/2016   HDL 61.00 06/18/2016   LDLCALC 99 06/18/2016   ALT 12 06/18/2016   AST 17 06/18/2016   NA 141 06/18/2016   K 3.9 06/18/2016   CL 104 06/18/2016   CREATININE 0.81 06/18/2016   BUN 16  06/18/2016   CO2 28 06/18/2016   TSH 1.15 06/18/2016   HGBA1C 5.3 06/18/2016    Lab Results  Component Value Date   TSH 1.15 06/18/2016   Lab Results  Component Value Date   WBC 7.2 06/18/2016   HGB 13.2 06/18/2016   HCT 38.3 06/18/2016   MCV 84.2 06/18/2016   PLT 231.0 06/18/2016   Lab Results  Component Value Date   NA 141 06/18/2016   K 3.9 06/18/2016   CO2 28 06/18/2016   GLUCOSE 96 06/18/2016   BUN 16 06/18/2016   CREATININE 0.81 06/18/2016   BILITOT 0.8 06/18/2016  ALKPHOS 74 06/18/2016   AST 17 06/18/2016   ALT 12 06/18/2016   PROT 6.7 06/18/2016   ALBUMIN 4.4 06/18/2016   CALCIUM 9.7 06/18/2016   ANIONGAP 8 01/04/2016   GFR 78.17 06/18/2016   Lab Results  Component Value Date   CHOL 199 06/18/2016   Lab Results  Component Value Date   HDL 61.00 06/18/2016   Lab Results  Component Value Date   LDLCALC 99 06/18/2016   Lab Results  Component Value Date   TRIG 194.0 (H) 06/18/2016   Lab Results  Component Value Date   CHOLHDL 3 06/18/2016   Lab Results  Component Value Date   HGBA1C 5.3 06/18/2016       Assessment & Plan:   Problem List Items Addressed This Visit    Essential hypertension, benign    Tolerating statin, encouraged heart healthy diet, avoid trans fats, minimize simple carbs and saturated fats. Increase exercise as tolerated      Hyperglycemia    hgba1c acceptable, minimize simple carbs and increase exercise as tolerated.       Hyperlipidemia, mixed    Encouraged heart healthy diet, increase exercise, avoid trans fats, consider a krill oil cap daily, mild         I have discontinued Megan May's nitroGLYCERIN. I am also having her maintain her ALPRAZolam, eletriptan, diltiazem, lisinopril, furosemide, albuterol, and dexlansoprazole.  Meds ordered this encounter  Medications  . dexlansoprazole (DEXILANT) 60 MG capsule    Sig: Take 60 mg by mouth daily.     Penni Homans, MD

## 2016-06-30 NOTE — Assessment & Plan Note (Signed)
Encouraged heart healthy diet, increase exercise, avoid trans fats, consider a krill oil cap daily, mild

## 2016-06-30 NOTE — Assessment & Plan Note (Signed)
Tolerating statin, encouraged heart healthy diet, avoid trans fats, minimize simple carbs and saturated fats. Increase exercise as tolerated 

## 2016-06-30 NOTE — Assessment & Plan Note (Signed)
hgba1c acceptable, minimize simple carbs and increase exercise as tolerated. 

## 2016-09-16 ENCOUNTER — Encounter: Payer: Self-pay | Admitting: Family Medicine

## 2016-10-17 ENCOUNTER — Encounter: Payer: Self-pay | Admitting: Family Medicine

## 2016-10-18 ENCOUNTER — Encounter: Payer: Self-pay | Admitting: Family Medicine

## 2016-10-19 ENCOUNTER — Other Ambulatory Visit: Payer: Self-pay | Admitting: Family Medicine

## 2016-10-19 ENCOUNTER — Ambulatory Visit (INDEPENDENT_AMBULATORY_CARE_PROVIDER_SITE_OTHER): Payer: BC Managed Care – PPO | Admitting: Emergency Medicine

## 2016-10-19 VITALS — BP 145/80 | HR 103 | Temp 100.4°F | Ht 67.0 in | Wt 181.6 lb

## 2016-10-19 DIAGNOSIS — B309 Viral conjunctivitis, unspecified: Secondary | ICD-10-CM

## 2016-10-19 DIAGNOSIS — J069 Acute upper respiratory infection, unspecified: Secondary | ICD-10-CM

## 2016-10-19 DIAGNOSIS — J029 Acute pharyngitis, unspecified: Secondary | ICD-10-CM | POA: Diagnosis not present

## 2016-10-19 MED ORDER — PREDNISONE 20 MG PO TABS: 40.0000 mg | ORAL_TABLET | Freq: Every day | ORAL | 0 refills | Status: AC

## 2016-10-19 MED ORDER — POLYMYXIN B-TRIMETHOPRIM 10000-0.1 UNIT/ML-% OP SOLN: 2.0000 [drp] | Freq: Three times a day (TID) | OPHTHALMIC | 0 refills | Status: DC

## 2016-10-19 MED ORDER — AZITHROMYCIN 250 MG PO TABS: ORAL_TABLET | ORAL | 0 refills | Status: DC

## 2016-10-19 NOTE — Progress Notes (Signed)
Megan May 56 y.o.   Chief Complaint  Patient presents with  . Sore Throat    X 1 day  . Fever    X 1 day    HISTORY OF PRESENT ILLNESS: This is a 56 y.o. female complaining of pink eye last week followed last night with fever and sore throat. No other significant symptoms.  Sore Throat   This is a new problem. The current episode started yesterday. The problem has been gradually worsening. The maximum temperature recorded prior to her arrival was 100.4 - 100.9 F. The fever has been present for less than 1 day. The pain is at a severity of 5/10. The pain is moderate. Associated symptoms include congestion, swollen glands and trouble swallowing. Pertinent negatives include no abdominal pain, coughing, diarrhea, drooling, ear pain, headaches, neck pain, shortness of breath or vomiting. She has had no exposure to strep or mono.     Prior to Admission medications   Medication Sig Start Date End Date Taking? Authorizing Provider  albuterol (PROVENTIL HFA;VENTOLIN HFA) 108 (90 Base) MCG/ACT inhaler Inhale 1-2 puffs into the lungs every 6 (six) hours as needed for wheezing or shortness of breath. 04/23/16  Yes Skeet Latch, MD  ALPRAZolam Duanne Moron) 0.5 MG tablet Take 0.5 mg by mouth 2 (two) times daily as needed for anxiety.    Yes Historical Provider, MD  dexlansoprazole (DEXILANT) 60 MG capsule Take 60 mg by mouth daily.   Yes Historical Provider, MD  diltiazem (CARDIZEM CD) 300 MG 24 hr capsule Take 1 capsule (300 mg total) by mouth daily. 02/10/16  Yes Mosie Lukes, MD  eletriptan (RELPAX) 40 MG tablet Take one at onset of hadache.   may repeat in 2 hours one time only if necessary 11/27/13  Yes Lanice Shirts, MD  furosemide (LASIX) 20 MG tablet 1 TABLET BY MOUTH DAILY AS NEEDED 04/23/16  Yes Skeet Latch, MD  lisinopril (PRINIVIL,ZESTRIL) 20 MG tablet 1 tablet by mouth now and then 1 tablet daily 04/09/16  Yes Skeet Latch, MD    Allergies  Allergen Reactions    . Amoxicillin-Pot Clavulanate Rash  . Levaquin [Levofloxacin In D5w] Other (See Comments)    tendonopathy  . Clindamycin/Lincomycin Itching  . Lactose Intolerance (Gi) Nausea Only  . Metoprolol     Weight gain/fluid retention   . Aspirin Other (See Comments)    Migraine headache  . Latex Rash  . Penicillins Swelling and Rash    Patient Active Problem List   Diagnosis Date Noted  . Hyperlipidemia, mixed 06/30/2016  . Hyperglycemia 03/15/2016  . Atypical chest pain 01/17/2016  . RUQ pain 01/17/2016  . Right hip pain 03/24/2015  . Leukocytopenia 03/24/2015  . Bursitis of left shoulder 03/22/2015  . Greater trochanteric bursitis of right hip 03/22/2015  . S/P laparotomy 06/14/2014  . Diverticulosis of colon without hemorrhage  colonoscopy 2015 05/27/2014  . Colon polyp 05/02/2014  . Microscopic hematuria work up urology  see 01/2014 note 04/22/2014  . Hypersomnia, recurrent   . Dysarthria 05/01/2013  . Mastodynia 10/13/2012  . Mass of right breast 10/13/2012  . Essential hypertension, benign 06/29/2011  . Migraines 06/29/2011  . Anxiety 06/29/2011  . Eosinophilic esophagitis 32/07/2481  . Lupus anticoagulant positive 06/29/2011    Past Medical History:  Diagnosis Date  . Anxiety   . Asthma   . Depression    history of   . Eosinophilic esophagitis   . Generalized anxiety disorder   . Hyperglycemia 03/15/2016  .  Hyperlipidemia, mixed 06/30/2016  . Hypersomnia, recurrent   . Hypertension   . Migraine   . Migraines   . Posttraumatic stress disorder     Past Surgical History:  Procedure Laterality Date  . CESAREAN SECTION  2004  . FRACTURE SURGERY  1973   R arm  . LAPAROTOMY Bilateral 06/14/2014   Procedure: LAPAROTOMY with right  salpingo-oophorectomy and left salpingectomy;  Surgeon: Linda Hedges, DO;  Location: [facility];  Service: Gynecology;  Laterality: Bilateral;  . TONSILLECTOMY AND ADENOIDECTOMY  1969    Social History   Social History  . Marital  status: Married    Spouse name: Randall Hiss  . Number of children: 1  . Years of education: 2 Masters   Occupational History  . [place]    [employer]   Social History Main Topics  . Smoking status: Never Smoker  . Smokeless tobacco: Never Used  . Alcohol use No  . Drug use: No  . Sexual activity: Yes    Birth control/ protection: Surgical   Other Topics Concern  . Not on file   Social History Narrative   Patient is right handed, resides in home with husband and daughter. She consumes 16 oz of tea daily.   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    Family History  Problem Relation Age of Onset  . Hypertension Father   . Heart disease Father   . Diabetes Father   . CAD Father   . Stroke Maternal Grandfather   . Stroke Maternal Grandmother   . Diabetes Paternal Grandmother   . Other Cousin     bicuspid aortic valve  . Diabetes Brother      Review of Systems  Constitutional: Positive for fever and malaise/fatigue.  HENT: Positive for congestion and trouble swallowing. Negative for drooling and ear pain.   Eyes: Positive for discharge and redness (left). Negative for blurred vision and double vision.  Respiratory: Negative for cough, hemoptysis and shortness of breath.   Cardiovascular: Negative for chest pain and palpitations.  Gastrointestinal: Negative for abdominal pain, diarrhea, nausea and vomiting.  Genitourinary: Negative for dysuria and hematuria.  Musculoskeletal: Negative for back pain, myalgias and neck pain.  Skin: Negative for rash.  Neurological: Negative for dizziness, sensory change, focal weakness and headaches.  All other systems reviewed and are negative.  Vitals:   10/19/16 1656  BP: (!) 145/80  Pulse: (!) 103  Temp: (!) 100.4 F (38 C)    Physical Exam  Constitutional: She is oriented to person, place, and time. She appears well-developed and well-nourished. She appears ill.  HENT:  Head:  Normocephalic and atraumatic.  Right Ear: External ear normal.  Left Ear: External ear normal.  Nose: Nose normal.  Mouth/Throat: Posterior oropharyngeal edema and posterior oropharyngeal erythema (+purulent secretions) present.  Eyes: Conjunctivae and EOM are normal. Pupils are equal, round, and reactive to light.  Neck: Normal range of motion. Neck supple. No JVD present. No thyromegaly present.  Cardiovascular: Normal rate, regular rhythm and normal heart sounds.   Pulmonary/Chest: Effort normal and breath sounds normal.  Abdominal: Soft. Bowel sounds are normal. There is no tenderness.  Musculoskeletal: Normal range of motion.  Lymphadenopathy:    She has no cervical adenopathy.  Neurological: She is alert and oriented to person, place, and time. No sensory deficit. She exhibits normal muscle tone.  Skin: Skin is warm and dry. Capillary refill takes less than 2 seconds. No rash noted.  Psychiatric:  She has a normal mood and affect. Her behavior is normal.  Vitals reviewed.    ASSESSMENT & PLAN: Megan May was seen today for sore throat and fever.  Diagnoses and all orders for this visit:  Acute pharyngitis, unspecified etiology  Acute viral conjunctivitis of left eye  Acute upper respiratory infection  Other orders -     azithromycin (ZITHROMAX) 250 MG tablet; Sig as indicated -     predniSONE (DELTASONE) 20 MG tablet; Take 2 tablets (40 mg total) by mouth daily with breakfast.    Patient Instructions       IF you received an x-ray today, you will receive an invoice from [facility]. Please contact [facility] at [phone] with questions or concerns regarding your invoice.   IF you received labwork today, you will receive an invoice from [place]. Please contact [facility] at [phone] with questions or concerns regarding your invoice.   Our billing staff will not be able to assist you with questions regarding bills from these companies.  You will  be contacted with the lab results as soon as they are available. The fastest way to get your results is to activate your My Chart account. Instructions are located on the last page of this paperwork. If you have not heard from [place] regarding the results in 2 weeks, please contact this office.      Sore Throat When you have a sore throat, your throat may:  Hurt.  Burn.  Feel irritated.  Feel scratchy. Many things can cause a sore throat, including:  An infection.  Allergies.  Dryness in the air.  Smoke or pollution.  Gastroesophageal reflux disease (GERD).  A tumor. A sore throat can be the first sign of another sickness. It can happen with other problems, like coughing or a fever. Most sore throats go away without treatment. Follow these instructions at home:  Take over-the-counter medicines only as told by your doctor.  Drink enough fluids to keep your pee (urine) clear or pale yellow.  Rest when you feel you need to.  To help with pain, try:  Sipping warm liquids, such as broth, herbal tea, or warm water.  Eating or drinking cold or frozen liquids, such as frozen ice pops.  Gargling with a salt-water mixture 3-4 times a day or as needed. To make a salt-water mixture, add -1 tsp of salt in 1 cup of warm water. Mix it until you cannot see the salt anymore.  Sucking on hard candy or throat lozenges.  Putting a cool-mist humidifier in your bedroom at night.  Sitting in the bathroom with the door closed for 5-10 minutes while you run hot water in the shower.  Do not use any tobacco products, such as cigarettes, chewing tobacco, and e-cigarettes. If you need help quitting, ask your doctor. Contact a doctor if:  You have a fever for more than 2-3 days.  You keep having symptoms for more than 2-3 days.  Your throat does not get better in 7 days.  You have a fever and your symptoms suddenly get worse. Get help right away if:  You have trouble breathing.  You  cannot swallow fluids, soft foods, or your saliva.  You have swelling in your throat or neck that gets worse.  You keep feeling like you are going to throw up (vomit).  You keep throwing up. This information is not intended to replace advice given to you by your health care provider. Make sure you discuss any questions you have  with your health care provider. Document Released: 04/28/2008 Document Revised: 03/15/2016 Document Reviewed: 05/10/2015 Elsevier Interactive Patient Education  2017 Elsevier Inc.    Agustina Caroli, MD [facility]

## 2016-10-19 NOTE — Patient Instructions (Addendum)
     IF you received an x-ray today, you will receive an invoice from [facility]. Please contact [facility] at [phone] with questions or concerns regarding your invoice.   IF you received labwork today, you will receive an invoice from [place]. Please contact [facility] at [phone] with questions or concerns regarding your invoice.   Our billing staff will not be able to assist you with questions regarding bills from these companies.  You will be contacted with the lab results as soon as they are available. The fastest way to get your results is to activate your My Chart account. Instructions are located on the last page of this paperwork. If you have not heard from [place] regarding the results in 2 weeks, please contact this office.      Sore Throat When you have a sore throat, your throat may:  Hurt.  Burn.  Feel irritated.  Feel scratchy. Many things can cause a sore throat, including:  An infection.  Allergies.  Dryness in the air.  Smoke or pollution.  Gastroesophageal reflux disease (GERD).  A tumor. A sore throat can be the first sign of another sickness. It can happen with other problems, like coughing or a fever. Most sore throats go away without treatment. Follow these instructions at home:  Take over-the-counter medicines only as told by your doctor.  Drink enough fluids to keep your pee (urine) clear or pale yellow.  Rest when you feel you need to.  To help with pain, try:  Sipping warm liquids, such as broth, herbal tea, or warm water.  Eating or drinking cold or frozen liquids, such as frozen ice pops.  Gargling with a salt-water mixture 3-4 times a day or as needed. To make a salt-water mixture, add -1 tsp of salt in 1 cup of warm water. Mix it until you cannot see the salt anymore.  Sucking on hard candy or throat lozenges.  Putting a cool-mist humidifier in your bedroom at night.  Sitting in the bathroom with the door  closed for 5-10 minutes while you run hot water in the shower.  Do not use any tobacco products, such as cigarettes, chewing tobacco, and e-cigarettes. If you need help quitting, ask your doctor. Contact a doctor if:  You have a fever for more than 2-3 days.  You keep having symptoms for more than 2-3 days.  Your throat does not get better in 7 days.  You have a fever and your symptoms suddenly get worse. Get help right away if:  You have trouble breathing.  You cannot swallow fluids, soft foods, or your saliva.  You have swelling in your throat or neck that gets worse.  You keep feeling like you are going to throw up (vomit).  You keep throwing up. This information is not intended to replace advice given to you by your health care provider. Make sure you discuss any questions you have with your health care provider. Document Released: 04/28/2008 Document Revised: 03/15/2016 Document Reviewed: 05/10/2015 Elsevier Interactive Patient Education  2017 [employer].

## 2016-10-28 ENCOUNTER — Encounter: Payer: Self-pay | Admitting: Family Medicine

## 2016-10-28 ENCOUNTER — Other Ambulatory Visit: Payer: Self-pay | Admitting: Family Medicine

## 2016-10-28 MED ORDER — AZITHROMYCIN 250 MG PO TABS: ORAL_TABLET | ORAL | 0 refills | Status: DC

## 2016-10-29 ENCOUNTER — Ambulatory Visit (INDEPENDENT_AMBULATORY_CARE_PROVIDER_SITE_OTHER): Payer: BC Managed Care – PPO | Admitting: Medical

## 2016-10-29 VITALS — BP 130/67 | HR 72 | Temp 98.2°F | Ht 67.0 in | Wt 185.0 lb

## 2016-10-29 DIAGNOSIS — J029 Acute pharyngitis, unspecified: Secondary | ICD-10-CM

## 2016-10-29 DIAGNOSIS — R0789 Other chest pain: Secondary | ICD-10-CM | POA: Diagnosis not present

## 2016-10-29 LAB — POCT RAPID STREP A (OFFICE): RAPID STREP A SCREEN: POSITIVE — AB

## 2016-10-29 MED ORDER — DOXYCYCLINE HYCLATE 100 MG PO TABS
100.0000 mg | ORAL_TABLET | Freq: Two times a day (BID) | ORAL | 0 refills | Status: DC
Start: 2016-10-29 — End: 2017-07-02

## 2016-10-29 NOTE — Progress Notes (Signed)
Pre visit review using our clinic tool,if applicable. No additional management support is needed unless otherwise documented below in the visit note.  

## 2016-10-29 NOTE — Progress Notes (Signed)
Subjective:    Patient ID: Megan May, female    DOB: [birth year], 56 y.o.   MRN: [id number]  HPI  Pt has moderate st day for 9 days. She went to UC and per pt UC did not test he for strep. Pt took z-pack and prednisone given by UC but still has some mild pain. Pt throat feels some better but not completely. No fever, no chills, no sweats, no bodyaches and no sinus pressure.    Pt states faint low level chest pain mid chest for 2 days. She ha no associated cardiac type signs and symptoms. Pt states work up August negative. NO wheezing. No coughing up mucous. I explained wanted to do ekg and work up if needed but she declined stating she knows that her heart is ok.  Prior echo done. Good ef. Prior ekg sinus rhythm but mild low pulse. Stress test showe st elevation  Review of Systems  Constitutional: Negative for chills, fatigue and fever.  HENT: Positive for sore throat. Negative for congestion, ear pain, facial swelling, mouth sores, nosebleeds, postnasal drip, rhinorrhea and sinus pain.   Respiratory: Negative for cough, chest tightness, shortness of breath, wheezing and stridor.   Cardiovascular: Positive for chest pain. Negative for palpitations and leg swelling.  Gastrointestinal: Negative for abdominal pain, anal bleeding, constipation, diarrhea, nausea and vomiting.  Musculoskeletal: Negative for back pain, myalgias and neck pain.  Neurological: Negative for dizziness, syncope, weakness, numbness and headaches.  Hematological: Negative for adenopathy. Does not bruise/bleed easily.  Psychiatric/Behavioral: Negative for behavioral problems and confusion.   Past Medical History:  Diagnosis Date  . Anxiety   . Asthma   . Depression    history of   . Eosinophilic esophagitis   . Generalized anxiety disorder   . Hyperglycemia 03/15/2016  . Hyperlipidemia, mixed 06/30/2016  . Hypersomnia, recurrent   . Hypertension   . Migraine   . Migraines   . Posttraumatic stress  disorder      Social History   Social History  . Marital status: Married    Spouse name: Randall Hiss  . Number of children: 1  . Years of education: 2 Masters   Occupational History  . [place]    [employer]   Social History Main Topics  . Smoking status: Never Smoker  . Smokeless tobacco: Never Used  . Alcohol use No  . Drug use: No  . Sexual activity: Yes    Birth control/ protection: Surgical   Other Topics Concern  . Not on file   Social History Narrative   Patient is right handed, resides in home with husband and daughter. She consumes 16 oz of tea daily.   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    Past Surgical History:  Procedure Laterality Date  . CESAREAN SECTION  2004  . FRACTURE SURGERY  1973   R arm  . LAPAROTOMY Bilateral 06/14/2014   Procedure: LAPAROTOMY with right  salpingo-oophorectomy and left salpingectomy;  Surgeon: Linda Hedges, DO;  Location: [facility];  Service: Gynecology;  Laterality: Bilateral;  . TONSILLECTOMY AND ADENOIDECTOMY  1969    Family History  Problem Relation Age of Onset  . Hypertension Father   . Heart disease Father   . Diabetes Father   . CAD Father   . Stroke Maternal Grandfather   . Stroke Maternal Grandmother   . Diabetes Paternal Grandmother   . Other Cousin     bicuspid aortic valve  .  Diabetes Brother     Allergies  Allergen Reactions  . Amoxicillin-Pot Clavulanate Rash  . Levaquin [Levofloxacin In D5w] Other (See Comments)    tendonopathy  . Clindamycin/Lincomycin Itching  . Lactose Intolerance (Gi) Nausea Only  . Metoprolol     Weight gain/fluid retention   . Aspirin Other (See Comments)    Migraine headache  . Latex Rash  . Penicillins Swelling and Rash    Current Outpatient Prescriptions on File Prior to Visit  Medication Sig Dispense Refill  . albuterol (PROVENTIL HFA;VENTOLIN HFA) 108 (90 Base) MCG/ACT inhaler Inhale 1-2 puffs into the lungs every 6  (six) hours as needed for wheezing or shortness of breath. 8 g 0  . ALPRAZolam (XANAX) 0.5 MG tablet Take 0.5 mg by mouth 2 (two) times daily as needed for anxiety.     Marland Kitchen dexlansoprazole (DEXILANT) 60 MG capsule Take 60 mg by mouth daily.    Marland Kitchen diltiazem (CARDIZEM CD) 300 MG 24 hr capsule Take 1 capsule (300 mg total) by mouth daily. 90 capsule 1  . eletriptan (RELPAX) 40 MG tablet Take one at onset of hadache.   may repeat in 2 hours one time only if necessary 10 tablet 0  . furosemide (LASIX) 20 MG tablet 1 TABLET BY MOUTH DAILY AS NEEDED 30 tablet 5  . lisinopril (PRINIVIL,ZESTRIL) 20 MG tablet 1 tablet by mouth now and then 1 tablet daily 30 tablet 5  . trimethoprim-polymyxin b (POLYTRIM) ophthalmic solution Place 2 drops into both eyes 3 (three) times daily. 10 mL 0  . azithromycin (ZITHROMAX) 250 MG tablet Sig as indicated (Patient not taking: Reported on 10/29/2016) 6 tablet 0  . [DISCONTINUED] DULoxetine (CYMBALTA) 30 MG capsule Take 60 mg by mouth daily.     . [DISCONTINUED] topiramate (TOPAMAX) 25 MG capsule Take 75 mg by mouth daily.      No current facility-administered medications on file prior to visit.     BP 130/67   Pulse 72   Temp 98.2 F (36.8 C) (Oral)   Ht 5\' 7"  (1.702 m)   Wt 185 lb (83.9 kg)   LMP 05/10/2014 (Approximate)   SpO2 100%   BMI 28.98 kg/m       Objective:   Physical Exam  General  Mental Status - Alert. General Appearance - Well groomed. Not in acute distress.  Skin Rashes- No Rashes.  HEENT Head- Normal. Ear Auditory Canal - Left- Normal. Right - Normal.Tympanic Membrane- Left- Normal. Right- Normal. Eye Sclera/Conjunctiva- Left- Normal. Right- Normal. Nose & Sinuses Nasal Mucosa- Left-  Not Boggy or Congested. Right-  Not Boggy or Congested.Bilateral no maxillary and no  frontal sinus pressure. Mouth & Throat Lips: Upper Lip- Normal: no dryness, cracking, pallor, cyanosis, or vesicular eruption. Lower Lip-Normal: no dryness, cracking,  pallor, cyanosis or vesicular eruption. Buccal Mucosa- Bilateral- No Aphthous ulcers. Oropharynx- No Discharge or Erythema. Tonsils: Characteristics- Bilateral- faint  Erythema. Size/Enlargement- Bilateral- No enlargement. Discharge- bilateral-None.  Neck Neck- Supple. No Masses. No jvd. No tracheal deviation.   Chest and Lung Exam Auscultation: Breath Sounds:-Clear even and unlabored.  Cardiovascular Auscultation:Rythm- Regular, rate and rhythm. Murmurs & Other Heart Sounds:Ausculatation of the heart reveal- No Murmurs.  Lymphatic Head & Neck General Head & Neck Lymphatics: Bilateral: Description- No Localized lymphadenopathy.  Anterior thorax- no costochondral junction tenderness      Assessment & Plan:  Rapid strep test was positive. Since did not respond to azithromycin will rx doxycyline.(rx advisement given)  Regarding your chest pain, you declined  ekg and prior  echo, stress test and ekg looked ok. However if your chest pain worsens then you would need ED evaluation.   Follow up in 7 days or prn  Nilam Quakenbush, Percell Miller, PA-C

## 2016-10-29 NOTE — Patient Instructions (Addendum)
Rapid strep test was positive. Since did not respond to azithromycin will rx doxycyline.(rx advisement given)  Regarding your chest pain, you declined ekg and prior echo, stress test and ekg looked ok. However if your chest pain worsens then you would need ED evaluation.   Follow up in 7 days or prn

## 2016-10-31 LAB — CULTURE, GROUP A STREP

## 2016-11-06 ENCOUNTER — Telehealth: Payer: Self-pay

## 2016-11-06 NOTE — Telephone Encounter (Signed)
Patient has eye pain and started doxycycline (VIBRA-TABS) 100 MG tablet  Last Friday. Patient wants to know should she stop taking the meds.  Please advise

## 2016-11-06 NOTE — Telephone Encounter (Signed)
Megan May saw this pt

## 2016-11-12 NOTE — Telephone Encounter (Signed)
I believe pt should be done with doxycycline. I wrote rx for 10 days and saw her on 10-29-2016. The eye symptoms is new and would not directly associate eye with doxycycline use. If she thinks it is side effect can go ahead and stop doxycycline.  However, eye pain needs to be evaluated. Can you call her and see what is going on and run her symptoms by Dr. Charlett Blake in the am. She might need to see optometrist urgent care, ED or someone here in our office before the weekend?

## 2016-11-13 NOTE — Telephone Encounter (Signed)
Was unable to reach patient at [phone] mailbox was full.   PC

## 2016-11-20 LAB — HM PAP SMEAR: HM Pap smear: NORMAL

## 2017-07-02 ENCOUNTER — Ambulatory Visit: Payer: BC Managed Care – PPO | Admitting: Cardiovascular Disease

## 2017-07-02 ENCOUNTER — Other Ambulatory Visit: Payer: Self-pay | Admitting: Cardiovascular Disease

## 2017-07-02 ENCOUNTER — Encounter: Payer: Self-pay | Admitting: Cardiovascular Disease

## 2017-07-02 ENCOUNTER — Other Ambulatory Visit: Payer: Self-pay | Admitting: *Deleted

## 2017-07-02 VITALS — BP 124/84 | HR 61 | Ht 67.0 in | Wt 192.8 lb

## 2017-07-02 DIAGNOSIS — R0789 Other chest pain: Secondary | ICD-10-CM

## 2017-07-02 DIAGNOSIS — R0602 Shortness of breath: Secondary | ICD-10-CM

## 2017-07-02 DIAGNOSIS — R5383 Other fatigue: Secondary | ICD-10-CM | POA: Insufficient documentation

## 2017-07-02 DIAGNOSIS — I10 Essential (primary) hypertension: Secondary | ICD-10-CM | POA: Diagnosis not present

## 2017-07-02 HISTORY — DX: Other fatigue: R53.83

## 2017-07-02 MED ORDER — LISINOPRIL 20 MG PO TABS: ORAL_TABLET | ORAL | 3 refills | Status: DC

## 2017-07-02 NOTE — Progress Notes (Signed)
Cardiology Office Note   Date:  07/02/2017   ID:  Megan May, DOB 06-14-1961, MRN [id number]  PCP:  Mosie Lukes, MD  Cardiologist:   Skeet Latch, MD   No chief complaint on file.    History of Present Illness: Megan May is a 56 y.o. female with hypertension, eosinophilic esophagitis, depression and anxiety  who presents for follow up.   She was first seen on 02/2016 for chest discomfort.  She was referred for ETT.  However when she got there her blood pressure was too high so it was cancelled.  She was started on lisinopril 20 mg daily.  She had dizziness but this seems to have improved.  She stopped taking HCTZ-triamterene but then noted that increased lower extremity edema.  She followed up with Tana Coast, PharmD, and was started on HCTZ.  She had and echo 03/09/16 that revealed LVEF 65-70% and was otherwise unremarkable.  She underwent ETT 05/2016 that was negative for ischemia.  She achieved 11.7 METs on a Bruce protocol.    Since her last appointment Megan May has been struggling with fatigue.  She attributes this to working 12-hour days.  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  She occasionally has chest pain, though it typically occurs at rest.  She has not noted any increased shortness of breath.  1 month ago she had an episode where she felt like she was going to pass out upon standing up.  At the time she was not taking her lisinopril as instructed.  Since that time she has also run out of her diltiazem and has not been able to refill it, as there were no refills left.  She does not check her blood pressure regularly.  However the day of her lightheadedness her blood pressure was in the 150s over 60s.  She also notes that she has been going through menopause and wonders if this is contributing to her fatigue.  She has not been getting much exercise out of work because she is too tired.  She plans to start  doing weight watchers soon.  She is previously been told that she had vitamin D deficiency but has not been able to tolerate supplementation because it made her feel even more tired.  She has not noted any lower extremity edema, orthopnea, or PND.    Past Medical History:  Diagnosis Date  . Anxiety   . Asthma   . Depression    history of   . Eosinophilic esophagitis   . Fatigue 07/02/2017  . Generalized anxiety disorder   . Hyperglycemia 03/15/2016  . Hyperlipidemia, mixed 06/30/2016  . Hypersomnia, recurrent   . Hypertension   . Migraine   . Migraines   . Posttraumatic stress disorder     Past Surgical History:  Procedure Laterality Date  . CESAREAN SECTION  2004  . FRACTURE SURGERY  1973   R arm  . LAPAROTOMY Bilateral 06/14/2014   Procedure: LAPAROTOMY with right  salpingo-oophorectomy and left salpingectomy;  Surgeon: Linda Hedges, DO;  Location: [facility];  Service: Gynecology;  Laterality: Bilateral;  . TONSILLECTOMY AND ADENOIDECTOMY  1969     Current Outpatient Medications  Medication Sig Dispense Refill  . albuterol (PROVENTIL HFA;VENTOLIN HFA) 108 (90 Base) MCG/ACT inhaler Inhale 1-2 puffs into the lungs every 6 (six) hours as needed for wheezing or shortness of breath. 8 g 0  . ALPRAZolam (XANAX) 0.5 MG tablet Take 0.5  mg by mouth 2 (two) times daily as needed for anxiety.     Marland Kitchen dexlansoprazole (DEXILANT) 60 MG capsule Take 60 mg by mouth daily.    . furosemide (LASIX) 20 MG tablet 1 TABLET BY MOUTH DAILY AS NEEDED 30 tablet 5  . lisinopril (PRINIVIL,ZESTRIL) 20 MG tablet 1 tablet by mouth now and then 1 tablet daily 30 tablet 5   No current facility-administered medications for this visit.     Allergies:   Amoxicillin-pot clavulanate; Levaquin [levofloxacin in d5w]; Clindamycin/lincomycin; Lactose intolerance (gi); Metoprolol; Aspirin; Latex; and Penicillins    Social History:  The patient  reports that  has never smoked. she has never used smokeless tobacco. She  reports that she does not drink alcohol or use drugs.   Family History:  The patient's family history includes CAD in her father; Diabetes in her brother, father, and paternal grandmother; Heart disease in her father; Hypertension in her father; Other in her cousin; Stroke in her maternal grandfather and maternal grandmother.    ROS:  Please see the history of present illness.   Otherwise, review of systems are positive for none.   All other systems are reviewed and negative.    PHYSICAL EXAM: VS:  BP 124/84   Pulse 61   Ht 5\' 7"  (1.702 m)   Wt 192 lb 12.8 oz (87.5 kg)   LMP 05/10/2014 (Approximate)   BMI 30.20 kg/m  , BMI Body mass index is 30.2 kg/m. GENERAL:  Well appearing HEENT: Pupils equal round and reactive, fundi not visualized, oral mucosa unremarkable NECK:  No jugular venous distention, waveform within normal limits, carotid upstroke brisk and symmetric, no bruits, no thyromegaly LUNGS:  Clear to auscultation bilaterally HEART:  RRR.  PMI not displaced or sustained,S1 and S2 within normal limits, no S3, no S4, no clicks, no rubs, no murmurs ABD:  Flat, positive bowel sounds normal in frequency in pitch, no bruits, no rebound, no guarding, no midline pulsatile mass, no hepatomegaly, no splenomegaly EXT:  2 plus pulses throughout, no edema, no cyanosis no clubbing SKIN:  No rashes no nodules NEURO:  Cranial nerves II through XII grossly intact, motor grossly intact throughout PSYCH:  Cognitively intact, oriented to person place and time  EKG:  EKG is ordered today. The ekg ordered 02/27/16 demonstrates sinus bradycardia. Rate 59 bpm. 07/02/17: Sinus rhythm.  Rate 61 bpm.  Non-specific T wave abnormalities.    Echo 03/09/16: Study Conclusions  - Left ventricle: The cavity size was normal. Systolic function was   vigorous. The estimated ejection fraction was in the range of 65%   to 70%. Wall motion was normal; there were no regional wall   motion abnormalities. Left  ventricular diastolic function   parameters were normal. - Aortic valve: Transvalvular velocity was within the normal range.   There was no stenosis. There was no regurgitation. - Mitral valve: Transvalvular velocity was within the normal range.   There was no evidence for stenosis. There was trivial   regurgitation. - Right ventricle: The cavity size was normal. Wall thickness was   normal. Systolic function was normal. - Atrial septum: No defect or patent foramen ovale was identified   by color flow Doppler. - Tricuspid valve: There was trivial regurgitation. - Pulmonary arteries: Systolic pressure was within the normal   range. PA peak pressure: 18 mm Hg (S).  Carotid u/s 05/02/13: IMPRESSION:  1.  Trace noncalcified atherosclerotic plaque in the left carotid bifurcation resulting in less than 50% diameter stenosis.  2.  Unremarkable right carotid artery. 3.  Bilateral vertebral arteries are patent with normal antegrade flow.   ETT 05/26/16:  Blood pressure demonstrated a hypertensive response to exercise.  There was no ST segment deviation noted during stress.   ETT with good exercise tolerance (10:00); no chest pain; hypertensive BP response; no diagnostic ST changes; negative adequate ETT; Duke treadmill score 10.   Recent Labs: No results found for requested labs within last 8760 hours.    Lipid Panel    Component Value Date/Time   CHOL 199 06/18/2016 1624   TRIG 194.0 (H) 06/18/2016 1624   HDL 61.00 06/18/2016 1624   CHOLHDL 3 06/18/2016 1624   VLDL 38.8 06/18/2016 1624   LDLCALC 99 06/18/2016 1624      Wt Readings from Last 3 Encounters:  07/02/17 192 lb 12.8 oz (87.5 kg)  10/29/16 185 lb (83.9 kg)  10/19/16 181 lb 9.6 oz (82.4 kg)      ASSESSMENT AND PLAN:  # Atypical chest pain: Symptoms are atypical and unchanged from prior.  They do not occur with exertion.  ETT was negative for ischemia 05/2016.  # Hypertension: BP is well-controlled.  She has  not been taking diltiazem and her blood pressure is very good today.  Continue lisinopril 20 mg daily only.  She takes Lasix twice weekly as needed for lower extremity edema.  She will track her BP at home and bring to follow up.  # Fatigue: Check TSH, free T4, CMP, CBC, magnesium, and vitamin D levels.  She has not tolerated vitamin D supplementation in the past.   Current medicines are reviewed at length with the patient today.  The patient does not have concerns regarding medicines.  The following changes have been made:  no change  Labs/ tests ordered today include:   Orders Placed This Encounter  Procedures  . CBC with Differential/Platelet  . T4, free  . TSH  . Basic metabolic panel  . Magnesium  . Vitamin D 1,25 dihydroxy  . EKG 12-Lead     Disposition:   FU with Megan Klann C. Oval Linsey, MD, Megan May in 2 months.    This note was written with the assistance of speech recognition software.  Please excuse any transcriptional errors.  Signed, Chadrick Sprinkle C. Oval Linsey, MD, [facility]  07/02/2017 9:57 AM    [facility]

## 2017-07-02 NOTE — Patient Instructions (Signed)
Medication Instructions:  Your physician recommends that you continue on your current medications as directed. Please refer to the Current Medication list given to you today.  Labwork: BMET/CBC/TSH/FT4/MAGNESIUM/VITAMIN D TODAY   Testing/Procedures: NONE  Follow-Up: Your physician recommends that you schedule a follow-up appointment in: 2 MONTH OV  Any Other Special Instructions Will Be Listed Below (If Applicable). MONITOR YOUR BLOOD PRESSURE AND BRING YOUR READINGS TO YOUR FOLLOW UP APPOINTMENT   If you need a refill on your cardiac medications before your next appointment, please call your pharmacy.

## 2017-07-06 LAB — CBC WITH DIFFERENTIAL/PLATELET
BASOS ABS: 0 10*3/uL (ref 0.0–0.2)
Basos: 0 %
EOS (ABSOLUTE): 0.2 10*3/uL (ref 0.0–0.4)
Eos: 3 %
Hematocrit: 38.2 % (ref 34.0–46.6)
Hemoglobin: 13.1 g/dL (ref 11.1–15.9)
IMMATURE GRANS (ABS): 0 10*3/uL (ref 0.0–0.1)
Immature Granulocytes: 0 %
LYMPHS: 21 %
Lymphocytes Absolute: 1.5 10*3/uL (ref 0.7–3.1)
MCH: 29.1 pg (ref 26.6–33.0)
MCHC: 34.3 g/dL (ref 31.5–35.7)
MCV: 85 fL (ref 79–97)
Monocytes Absolute: 0.4 10*3/uL (ref 0.1–0.9)
Monocytes: 5 %
Neutrophils Absolute: 4.9 10*3/uL (ref 1.4–7.0)
Neutrophils: 71 %
PLATELETS: 208 10*3/uL (ref 150–379)
RBC: 4.5 x10E6/uL (ref 3.77–5.28)
RDW: 13.4 % (ref 12.3–15.4)
WBC: 7 10*3/uL (ref 3.4–10.8)

## 2017-07-06 LAB — BASIC METABOLIC PANEL
BUN/Creatinine Ratio: 15 (ref 9–23)
BUN: 10 mg/dL (ref 6–24)
CO2: 22 mmol/L (ref 20–29)
Calcium: 9.5 mg/dL (ref 8.7–10.2)
Chloride: 103 mmol/L (ref 96–106)
Creatinine, Ser: 0.67 mg/dL (ref 0.57–1.00)
GFR calc Af Amer: 114 mL/min/{1.73_m2} (ref >59)
GFR, EST NON AFRICAN AMERICAN: 99 mL/min/{1.73_m2} (ref >59)
GLUCOSE: 96 mg/dL (ref 65–99)
POTASSIUM: 4 mmol/L (ref 3.5–5.2)
SODIUM: 139 mmol/L (ref 134–144)

## 2017-07-06 LAB — MAGNESIUM: Magnesium: 1.7 mg/dL (ref 1.6–2.3)

## 2017-07-06 LAB — T4, FREE: FREE T4: 1.01 ng/dL (ref 0.82–1.77)

## 2017-07-06 LAB — VITAMIN D 1,25 DIHYDROXY
VITAMIN D 1, 25 (OH) TOTAL: 46 pg/mL
VITAMIN D 1, 25 (OH) TOTAL: 46 pg/mL
VITAMIN D3 1, 25 (OH): 45 pg/mL
Vitamin D2 1, 25 (OH)2: <10 pg/mL
Vitamin D3 1, 25 (OH)2: 46 pg/mL

## 2017-07-06 LAB — TSH: TSH: 1.51 u[IU]/mL (ref 0.450–4.500)

## 2017-07-08 ENCOUNTER — Encounter: Payer: Self-pay | Admitting: Cardiovascular Disease

## 2017-07-09 ENCOUNTER — Encounter: Payer: Self-pay | Admitting: Cardiovascular Disease

## 2017-07-14 ENCOUNTER — Other Ambulatory Visit: Payer: Self-pay | Admitting: Cardiovascular Disease

## 2017-07-14 MED ORDER — LISINOPRIL 40 MG PO TABS: ORAL_TABLET | ORAL | 3 refills | Status: DC

## 2017-07-14 NOTE — Progress Notes (Signed)
Patient noted that she was accidentally taking 40mg  instead of 20mg  of lisinopril.  Labs were OK 07/02/17 on this dose.  Her records have been updated accordingly.   Rogan Ecklund C. Oval Linsey, MD, [facility]

## 2017-07-21 ENCOUNTER — Encounter: Payer: Self-pay | Admitting: Family Medicine

## 2017-07-21 ENCOUNTER — Other Ambulatory Visit: Payer: Self-pay | Admitting: Family Medicine

## 2017-07-21 MED ORDER — POLYMYXIN B-TRIMETHOPRIM 10000-0.1 UNIT/ML-% OP SOLN: 1.0000 [drp] | Freq: Four times a day (QID) | OPHTHALMIC | 0 refills | Status: DC

## 2017-07-21 NOTE — Telephone Encounter (Signed)
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 She does have symptoms- her R eye is watery and res and she feels like the L eye is getting ready to start. 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

## 2017-07-30 ENCOUNTER — Encounter: Payer: Self-pay | Admitting: Medical

## 2017-07-30 ENCOUNTER — Ambulatory Visit: Payer: BC Managed Care – PPO | Admitting: Medical

## 2017-07-30 ENCOUNTER — Ambulatory Visit: Payer: Self-pay

## 2017-07-30 VITALS — BP 142/70 | HR 57 | Temp 98.4°F | Resp 16 | Ht 67.0 in | Wt 191.0 lb

## 2017-07-30 DIAGNOSIS — N3000 Acute cystitis without hematuria: Secondary | ICD-10-CM

## 2017-07-30 DIAGNOSIS — M545 Low back pain, unspecified: Secondary | ICD-10-CM

## 2017-07-30 DIAGNOSIS — R300 Dysuria: Secondary | ICD-10-CM

## 2017-07-30 LAB — POC URINALSYSI DIPSTICK (AUTOMATED)
Bilirubin, UA: NEGATIVE
Blood, UA: NEGATIVE
Glucose, UA: NEGATIVE
KETONES UA: NEGATIVE
LEUKOCYTES UA: NEGATIVE
Nitrite, UA: NEGATIVE
PH UA: 6 (ref 5.0–8.0)
PROTEIN UA: NEGATIVE
SPEC GRAV UA: 1.015 (ref 1.010–1.025)
UROBILINOGEN UA: 0.2 U/dL

## 2017-07-30 MED ORDER — NITROFURANTOIN MONOHYD MACRO 100 MG PO CAPS: 100.0000 mg | ORAL_CAPSULE | Freq: Two times a day (BID) | ORAL | 0 refills | Status: DC

## 2017-07-30 MED FILL — NITROFURANTOIN MONOHYD MACR: 100 | 7 days supply | Qty: 14 | Fill #0

## 2017-07-30 NOTE — Telephone Encounter (Signed)
Pt. reported burning with urination,low back pain, and lower abdominal pressure for 2-3 days.  Stated "I took a test to check for UTI at home, and it is positive."  Per protocol, scheduled appt. this afternoon at 3:30 PM. Agreed with plan.      Reason for Disposition . Side (flank) or lower back pain present  Answer Assessment - Initial Assessment Questions 1. SYMPTOM: "What's the main symptom you're concerned about?" (e.g., frequency, incontinence)     Burning with urination, low back pain, and Abdominal pressure  2. ONSET: "When did the  ________  start?"     A few days ago 3. PAIN: "Is there any pain?" If so, ask: "How bad is it?" (Scale: 1-10; mild, moderate, severe)     4/10  4. CAUSE: "What do you think is causing the symptoms?"     UTI 5. OTHER SYMPTOMS: "Do you have any other symptoms?" (e.g., fever, flank pain, blood in urine, pain with urination)     No flank pain, blood in urine.  C/o burning with urination  6. PREGNANCY: "Is there any chance you are pregnant?" "When was your last menstrual period?"    No; LMP a few years ago  Protocols used: URINARY [facility]

## 2017-07-30 NOTE — Progress Notes (Signed)
Subjective:    Patient ID: Megan May, female    DOB: [birth year], 56 y.o.   MRN: [id number]  HPI  Pt in today reporting urinary symptoms for 3 days  Dysuria- slight Frequent urination-no Hesitancy-no Suprapubic pressure-yes Fever-no chills-some. Nausea-no Vomiting-no CVA pain-mild but in middle. History of UTI- Rare uti.  Gross hematuria- no  No itching or vaginal discharge.  Pt did at home test and had a lot of leukocytes. She hydrated a lot before coming in today.   Review of Systems  Constitutional: Positive for chills. Negative for fatigue and fever.       For maybe 2 days.  Respiratory: Negative for cough, chest tightness, shortness of breath and wheezing.   Cardiovascular: Negative for chest pain and palpitations.  Gastrointestinal: Negative for abdominal pain, anal bleeding, diarrhea and nausea.  Genitourinary: Positive for dysuria. Negative for dyspareunia, flank pain, frequency, pelvic pain, urgency and vaginal pain.  Musculoskeletal: Positive for back pain. Negative for neck stiffness.  Skin: Negative for rash.  Neurological: Negative for dizziness, weakness and headaches.  Hematological: Negative for adenopathy. Does not bruise/bleed easily.  Psychiatric/Behavioral: Negative for behavioral problems.   Past Medical History:  Diagnosis Date  . Anxiety   . Asthma   . Depression    history of   . Eosinophilic esophagitis   . Fatigue 07/02/2017  . Generalized anxiety disorder   . Hyperglycemia 03/15/2016  . Hyperlipidemia, mixed 06/30/2016  . Hypersomnia, recurrent   . Hypertension   . Migraine   . Migraines   . Posttraumatic stress disorder      Social History   Socioeconomic History  . Marital status: Married    Spouse name: Randall Hiss  . Number of children: 1  . Years of education: 2 Masters  . Highest education level: Not on file  Social Needs  . Financial resource strain: Not on file  . Food insecurity - worry: Not on file  . Food  insecurity - inability: Not on file  . Transportation needs - medical: Not on file  . Transportation needs - non-medical: Not on file  Occupational History  . Occupation: SPECIAL EDUCATOR    Employer: [employer]    Comment: Teacher, early years/pre  Tobacco Use  . Smoking status: Never Smoker  . Smokeless tobacco: Never Used  Substance and Sexual Activity  . Alcohol use: No  . Drug use: No  . Sexual activity: Yes    Birth control/protection: Surgical  Other Topics Concern  . Not on file  Social History Narrative   Patient is right handed, resides in home with husband and daughter. She consumes 16 oz of tea daily.   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    Past Surgical History:  Procedure Laterality Date  . CESAREAN SECTION  2004  . FRACTURE SURGERY  1973   R arm  . LAPAROTOMY Bilateral 06/14/2014   Procedure: LAPAROTOMY with right  salpingo-oophorectomy and left salpingectomy;  Surgeon: Linda Hedges, DO;  Location: [facility];  Service: Gynecology;  Laterality: Bilateral;  . TONSILLECTOMY AND ADENOIDECTOMY  1969    Family History  Problem Relation Age of Onset  . Hypertension Father   . Heart disease Father   . Diabetes Father   . CAD Father   . Stroke Maternal Grandfather   . Stroke Maternal Grandmother   . Diabetes Paternal Grandmother   . Other Cousin        bicuspid aortic valve  . Diabetes Brother  Allergies  Allergen Reactions  . Amoxicillin-Pot Clavulanate Rash  . Levaquin [Levofloxacin In D5w] Other (See Comments)    tendonopathy  . Clindamycin Itching  . Clindamycin/Lincomycin Itching  . Lactose Intolerance (Gi) Nausea Only  . Metoprolol     Weight gain/fluid retention   . Penicillin G Other (See Comments)  . Aspirin Other (See Comments)    Migraine headache  . Latex Rash  . Penicillins Swelling and Rash    Current Outpatient Medications on File Prior to Visit  Medication Sig Dispense Refill  . albuterol (PROVENTIL  HFA;VENTOLIN HFA) 108 (90 Base) MCG/ACT inhaler Inhale 1-2 puffs into the lungs every 6 (six) hours as needed for wheezing or shortness of breath. 8 g 0  . ALPRAZolam (XANAX) 0.5 MG tablet Take 0.5 mg by mouth 2 (two) times daily as needed for anxiety.     Marland Kitchen dexlansoprazole (DEXILANT) 60 MG capsule Take 60 mg by mouth daily.    . furosemide (LASIX) 20 MG tablet 1 TABLET BY MOUTH DAILY AS NEEDED 30 tablet 5  . lisinopril (PRINIVIL,ZESTRIL) 40 MG tablet 1 tablet by mouth now and then 1 tablet daily 90 tablet 3  . trimethoprim-polymyxin b (POLYTRIM) ophthalmic solution Place 1 drop into both eyes every 6 (six) hours. 10 mL 0  . [DISCONTINUED] DULoxetine (CYMBALTA) 30 MG capsule Take 60 mg by mouth daily.     . [DISCONTINUED] topiramate (TOPAMAX) 25 MG capsule Take 75 mg by mouth daily.      No current facility-administered medications on file prior to visit.     BP (!) 142/70 (BP Location: Right Arm, Patient Position: Sitting, Cuff Size: Small)   Pulse (!) 57   Temp 98.4 F (36.9 C) (Oral)   Resp 16   Ht 5\' 7"  (1.702 m)   Wt 191 lb (86.6 kg)   LMP 05/10/2014 (Approximate)   SpO2 100%   BMI 29.91 kg/m       Objective:   Physical Exam  General- No acute distress. Pleasant patient. Neck- Full range of motion, no jvd Lungs- Clear, even and unlabored. Heart- regular rate and rhythm. Neurologic- CNII- XII grossly intact.  Abdomen- soft, nontender, nondistended, +bs, mild suprapubic pressure. Back- no cva tenderness.      Assessment & Plan:  You appear to have a urinary tract infection. I am prescribing macrobid antibiotic for the probable infection. Hydrate well. I am sending out a urine culture. During the interim if your signs and symptoms worsen rather than improving please notify us. We will notify your when the culture results are back.  Follow up in 7 days or as needed.

## 2017-07-30 NOTE — Patient Instructions (Addendum)
You appear to have a urinary tract infection. I am prescribing macrobid antibiotic for the probable infection. Hydrate well. I am sending out a urine culture. During the interim if your signs and symptoms worsen rather than improving please notify us. We will notify your when the culture results are back.  Follow up in 7 days or as needed. 

## 2017-07-30 NOTE — Telephone Encounter (Signed)
FYI

## 2017-07-31 LAB — URINE CULTURE
MICRO NUMBER:: 81457477
RESULT: NO GROWTH
SPECIMEN QUALITY: ADEQUATE

## 2017-09-17 ENCOUNTER — Encounter: Payer: Self-pay | Admitting: Medical

## 2017-09-17 ENCOUNTER — Ambulatory Visit: Payer: BC Managed Care – PPO | Admitting: Medical

## 2017-09-17 DIAGNOSIS — H938X1 Other specified disorders of right ear: Secondary | ICD-10-CM | POA: Diagnosis not present

## 2017-09-17 DIAGNOSIS — H6121 Impacted cerumen, right ear: Secondary | ICD-10-CM | POA: Diagnosis not present

## 2017-09-17 DIAGNOSIS — R6889 Other general symptoms and signs: Secondary | ICD-10-CM

## 2017-09-17 DIAGNOSIS — R0981 Nasal congestion: Secondary | ICD-10-CM | POA: Diagnosis not present

## 2017-09-17 LAB — POCT INFLUENZA A/B
Influenza A, POC: NEGATIVE
Influenza B, POC: NEGATIVE

## 2017-09-17 MED ORDER — OSELTAMIVIR PHOSPHATE 75 MG PO CAPS: 75.0000 mg | ORAL_CAPSULE | Freq: Two times a day (BID) | ORAL | 0 refills | Status: DC

## 2017-09-17 MED ORDER — BENZONATATE 100 MG PO CAPS: 100.0000 mg | ORAL_CAPSULE | Freq: Three times a day (TID) | ORAL | 0 refills | Status: DC | PRN

## 2017-09-17 MED ORDER — FLUTICASONE PROPIONATE 50 MCG/ACT NA SUSP: 2.0000 | Freq: Every day | NASAL | 1 refills | Status: DC

## 2017-09-17 NOTE — Progress Notes (Signed)
Subjective:    Patient ID: Megan May, female    DOB: [birth year], 57 y.o.   MRN: [id number]  HPI  Pt is a Pharmacist, hospital. Pt has nasal congestion, rt ear pressure and fatigue. Some mild watery eyes. 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 These kids are 37 years old.   Pt had flu vaccine.  Pt has felt chills and clammy at times. No fever and no body aches. Rare cough.   Review of Systems  Constitutional: Positive for chills and fatigue. Negative for fever.  HENT: Positive for congestion.        Right ear pressure  Respiratory: Positive for cough. Negative for shortness of breath and wheezing.        Rare cough  Cardiovascular: Negative for chest pain and palpitations.  Gastrointestinal: Negative for abdominal pain, constipation, diarrhea and nausea.  Musculoskeletal: Negative for back pain and gait problem.  Neurological: Negative for dizziness, speech difficulty, weakness, numbness and headaches.  Hematological: Negative for adenopathy. Does not bruise/bleed easily.  Psychiatric/Behavioral: Negative for behavioral problems and confusion.   Past Medical History:  Diagnosis Date  . Anxiety   . Asthma   . Depression    history of   . Eosinophilic esophagitis   . Fatigue 07/02/2017  . Generalized anxiety disorder   . Hyperglycemia 03/15/2016  . Hyperlipidemia, mixed 06/30/2016  . Hypersomnia, recurrent   . Hypertension   . Migraine   . Migraines   . Posttraumatic stress disorder      Social History   Socioeconomic History  . Marital status: Married    Spouse name: Randall Hiss  . Number of children: 1  . Years of education: 2 Masters  . Highest education level: Not on file  Social Needs  . Financial resource strain: Not on file  . Food insecurity - worry: Not on file  . Food insecurity - inability: Not on file  . Transportation needs - medical: Not on file  . Transportation needs - non-medical: Not on file  Occupational  History  . Occupation: SPECIAL EDUCATOR    Employer: [employer]    Comment: Teacher, early years/pre  Tobacco Use  . Smoking status: Never Smoker  . Smokeless tobacco: Never Used  Substance and Sexual Activity  . Alcohol use: No  . Drug use: No  . Sexual activity: Yes    Birth control/protection: Surgical  Other Topics Concern  . Not on file  Social History Narrative   Patient is right handed, resides in home with husband and daughter. She consumes 16 oz of tea daily.   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    Past Surgical History:  Procedure Laterality Date  . CESAREAN SECTION  2004  . FRACTURE SURGERY  1973   R arm  . LAPAROTOMY Bilateral 06/14/2014   Procedure: LAPAROTOMY with right  salpingo-oophorectomy and left salpingectomy;  Surgeon: Linda Hedges, DO;  Location: [facility];  Service: Gynecology;  Laterality: Bilateral;  . TONSILLECTOMY AND ADENOIDECTOMY  1969    Family History  Problem Relation Age of Onset  . Hypertension Father   . Heart disease Father   . Diabetes Father   . CAD Father   . Stroke Maternal Grandfather   . Stroke Maternal Grandmother   . Diabetes Paternal Grandmother   . Other Cousin        bicuspid aortic valve  . Diabetes Brother     Allergies  Allergen Reactions  . Amoxicillin-Pot Clavulanate Rash  . Levaquin [Levofloxacin In D5w] Other (See Comments)    tendonopathy  . Clindamycin Itching  . Clindamycin/Lincomycin Itching  . Lactose Intolerance (Gi) Nausea Only  . Metoprolol     Weight gain/fluid retention   . Penicillin G Other (See Comments)  . Aspirin Other (See Comments)    Migraine headache  . Latex Rash  . Penicillins Swelling and Rash    Current Outpatient Medications on File Prior to Visit  Medication Sig Dispense Refill  . albuterol (PROVENTIL HFA;VENTOLIN HFA) 108 (90 Base) MCG/ACT inhaler Inhale 1-2 puffs into the lungs every 6 (six) hours as needed for wheezing or shortness of breath. 8 g 0  .  ALPRAZolam (XANAX) 0.5 MG tablet Take 0.5 mg by mouth 2 (two) times daily as needed for anxiety.     . furosemide (LASIX) 20 MG tablet 1 TABLET BY MOUTH DAILY AS NEEDED 30 tablet 5  . lisinopril (PRINIVIL,ZESTRIL) 40 MG tablet 1 tablet by mouth now and then 1 tablet daily 90 tablet 3  . trimethoprim-polymyxin b (POLYTRIM) ophthalmic solution Place 1 drop into both eyes every 6 (six) hours. 10 mL 0  . [DISCONTINUED] DULoxetine (CYMBALTA) 30 MG capsule Take 60 mg by mouth daily.     . [DISCONTINUED] topiramate (TOPAMAX) 25 MG capsule Take 75 mg by mouth daily.      No current facility-administered medications on file prior to visit.     LMP 05/10/2014 (Approximate)       Objective:   Physical Exam  General  Mental Status - Alert. General Appearance - Well groomed. Not in acute distress.  Skin Rashes- No Rashes.  HEENT Head- Normal. Ear Auditory Canal - Left- Normal. Right -cerumen impaction.  Tympanic Membrane- Left- Normal. Right-cannot visualize TM due to wax(post lavage 95% wax removed but still can't see tm). Eye Sclera/Conjunctiva- Left- Normal. Right- Normal. Nose & Sinuses Nasal Mucosa- Left-  Boggy and Congested. Right-  Boggy and  Congested.Bilateral no maxillary and no frontal sinus pressure. Mouth & Throat Lips: Upper Lip- Normal: no dryness, cracking, pallor, cyanosis, or vesicular eruption. Lower Lip-Normal: no dryness, cracking, pallor, cyanosis or vesicular eruption. Buccal Mucosa- Bilateral- No Aphthous ulcers. Oropharynx- No Discharge or Erythema. Tonsils: Characteristics- Bilateral- No Erythema or Congestion. Size/Enlargement- Bilateral- No enlargement. Discharge- bilateral-None.  Neck Neck- Supple. No Masses.   Chest and Lung Exam Auscultation: Breath Sounds:-Clear even and unlabored.  Cardiovascular Auscultation:Rythm- Regular, rate and rhythm. Murmurs & Other Heart Sounds:Ausculatation of the heart reveal- No Murmurs.  Lymphatic Head & Neck General  Head & Neck Lymphatics: Bilateral: Description- No Localized lymphadenopathy.       Assessment & Plan:  Your flu test was negative but you have had a lot of recent potential exposure to your classroom.  We discussed taking Tamiflu preventatively versus taking it for more suspicious symptoms.  You declined preventative Rx.  I will give you a printed prescription of Tamiflu as we are heading forward to the weekend.  If over the next 3-4 days you develop severe diffuse muscle aches with high fevers then you could start Tamiflu.  Otherwise if you never develop worse symptoms would not recommend using.  For nasal congestion, I am prescribing Flonase. If your cough gets worse can use benzonatate(Rx sent to your pharmacy).  Wax removed about 95% from rt canal. Can use debrox otc next 3-4 days.   If you develop any ear pain let us know. Also advise [place] on any sinus pressure  or bronchitis symptoms.  Follow up in 7 days or as  Needed  Mackie Pai, PA-C

## 2017-09-17 NOTE — Patient Instructions (Addendum)
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  We discussed taking Tamiflu preventatively versus taking it for more suspicious symptoms.  You declined preventative Rx.  I will give you a printed prescription of Tamiflu as  we are heading forward to the weekend.  If over the next 3-4 days you develop severe diffuse muscle aches with high fevers then you could start Tamiflu.  Otherwise if you never develop worse symptoms would not recommend using.  For nasal congestion, I am prescribing Flonase. If your cough gets worse can use benzonatate(Rx sent to your pharmacy).  Wax removed about 95% from rt canal. Can use debrox otc next 3-4 days.   If you develop any ear pain let us know. Also advise [place] on any sinus pressure or bronchitis symptoms as well.  Follow up in 7 days or as  needed

## 2017-09-30 ENCOUNTER — Ambulatory Visit: Payer: BC Managed Care – PPO | Admitting: Cardiovascular Disease

## 2017-11-25 ENCOUNTER — Other Ambulatory Visit: Payer: Self-pay

## 2017-11-25 MED ORDER — FUROSEMIDE 20 MG PO TABS: ORAL_TABLET | ORAL | 3 refills | Status: DC

## 2017-12-11 ENCOUNTER — Encounter: Payer: Self-pay | Admitting: Family Medicine

## 2018-01-26 ENCOUNTER — Other Ambulatory Visit: Payer: Self-pay | Admitting: Family Medicine

## 2018-01-26 DIAGNOSIS — Z1231 Encounter for screening mammogram for malignant neoplasm of breast: Secondary | ICD-10-CM

## 2018-02-22 ENCOUNTER — Ambulatory Visit
Admission: RE | Admit: 2018-02-22 | Discharge: 2018-02-22 | Disposition: A | Discharge status: Home / Self Care | Payer: BC Managed Care – PPO | Source: Ambulatory Visit | Attending: Family Medicine | Admitting: Family Medicine

## 2018-02-22 DIAGNOSIS — Z1231 Encounter for screening mammogram for malignant neoplasm of breast: Secondary | ICD-10-CM

## 2018-02-24 ENCOUNTER — Ambulatory Visit (HOSPITAL_BASED_OUTPATIENT_CLINIC_OR_DEPARTMENT_OTHER)
Admission: RE | Admit: 2018-02-24 | Discharge: 2018-02-24 | Disposition: A | Discharge status: Home / Self Care | Payer: BC Managed Care – PPO | Source: Ambulatory Visit | Attending: Medical | Admitting: Medical

## 2018-02-24 ENCOUNTER — Ambulatory Visit: Payer: BC Managed Care – PPO | Admitting: Medical

## 2018-02-24 ENCOUNTER — Telehealth: Payer: Self-pay | Admitting: Medical

## 2018-02-24 ENCOUNTER — Encounter: Payer: Self-pay | Admitting: Medical

## 2018-02-24 VITALS — BP 132/59 | HR 72 | Temp 98.4°F | Resp 16 | Ht 67.0 in | Wt 193.6 lb

## 2018-02-24 DIAGNOSIS — M542 Cervicalgia: Secondary | ICD-10-CM

## 2018-02-24 DIAGNOSIS — M50322 Other cervical disc degeneration at C5-C6 level: Secondary | ICD-10-CM | POA: Insufficient documentation

## 2018-02-24 DIAGNOSIS — G5603 Carpal tunnel syndrome, bilateral upper limbs: Secondary | ICD-10-CM

## 2018-02-24 NOTE — Telephone Encounter (Signed)
Splinting is good, I also have people ice the Carpal tunnel for 15 mnutes twice a day and then apply a Lidocaine patch to the area before putting on splint. Can cut them down to size

## 2018-02-24 NOTE — Patient Instructions (Signed)
Some of your symptoms appear possible carpel tunnel like. So will give you a wrist cock up splint and try to use at all times. Nsaids are recommended as conservative treatment but your ha history may prohibit use. Will try to send Dr. Charlett Blake note and see if she is ok with low dose/brief use. Will see if Dr. Charlett Blake wants to defer to your former neurologist.  Will get cspine xray to assess joint space and foramen spaces.   Follow up in 2 weeks or as needed.

## 2018-02-24 NOTE — Telephone Encounter (Signed)
Dr. Charlett Blake,  I sent you copy of note today. I think likely carpal tunnel syndrome. She has wrist cock up splint which we gave her today. I suggested low dose ibuprofen. Pt has history of chronic migraine and she states her former neurologist told her not to take tylenol or nsaids as can excacerbate her head aches. For now just using the splint. I mentioned would see how you feel of about low dose ibuprofen 200-400 mg every 8 hours. Just wanted your opinion. Her symptoms are very minimal presently so I was thinking maybe just see how she responds to splint. Then see how she responds as she is convinced was told not to take nsaids.  Thanks,   Mackie Pai, PA-C

## 2018-02-24 NOTE — Progress Notes (Signed)
Subjective:    Patient ID: Megan May, female    DOB: 1960/08/30, 57 y.o.   MRN: [id number]  HPI   Pt in with  Some tingling sensation to her left 5th digit that is constant for months. No hx of trauma to hands.  Some neck pain that hurts all the time for months.  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 No obvious pain radiating down her arm.  Pt notes at night waking up about at night with tingling of hand this happens moderate frequency.  On discussion patient is not reporting any ha or gross motor/sensory function deficits along with focal tingling..    Review of Systems  Constitutional: Negative for chills, fatigue and fever.  Respiratory: Negative for cough, chest tightness and wheezing.   Cardiovascular: Negative for chest pain and palpitations.  Gastrointestinal: Negative for abdominal pain.  Genitourinary: Negative for dyspareunia, dysuria, menstrual problem, urgency and vaginal pain.  Musculoskeletal: Positive for neck pain. Negative for back pain, gait problem and neck stiffness.  Skin: Negative for rash.  Neurological: Negative for dizziness, tremors, syncope, light-headedness and numbness.       Pt concern if uses nsaids might get every day migraine syndrome if uses nsaids.  Pt has seen ha clinic. Dr.  Algernon Huxley working at [facility] clinic) She may have changed practice location/organization. Now with [facility].. Was with HA clinic at [facility].  Hematological: Negative for adenopathy. Does not bruise/bleed easily.  Psychiatric/Behavioral: Negative for behavioral problems, decreased concentration, dysphoric mood and hallucinations.   Past Medical History:  Diagnosis Date  . Anxiety   . Asthma   . Depression    history of   . Eosinophilic esophagitis   . Fatigue 07/02/2017  . Generalized anxiety disorder   . Hyperglycemia 03/15/2016  . Hyperlipidemia, mixed 06/30/2016  . Hypersomnia, recurrent   . Hypertension   . Migraine   . Migraines   .  Posttraumatic stress disorder      Social History   Socioeconomic History  . Marital status: Married    Spouse name: Randall Hiss  . Number of children: 1  . Years of education: 2 Masters  . Highest education level: Not on file  Occupational History  . Occupation: SPECIAL EDUCATOR    Employer: [employer]    Comment: Teacher, early years/pre  Social Needs  . Financial resource strain: Not on file  . Food insecurity:    Worry: Not on file    Inability: Not on file  . Transportation needs:    Medical: Not on file    Non-medical: Not on file  Tobacco Use  . Smoking status: Never Smoker  . Smokeless tobacco: Never Used  Substance and Sexual Activity  . Alcohol use: No  . Drug use: No  . Sexual activity: Yes    Birth control/protection: Surgical  Lifestyle  . Physical activity:    Days per week: Not on file    Minutes per session: Not on file  . Stress: Not on file  Relationships  . Social connections:    Talks on phone: Not on file    Gets together: Not on file    Attends religious service: Not on file    Active member of club or organization: Not on file    Attends meetings of clubs or organizations: Not on file    Relationship status: Not on file  . Intimate partner violence:    Fear of current or ex partner: Not on file  Emotionally abused: Not on file    Physically abused: Not on file    Forced sexual activity: Not on file  Other Topics Concern  . Not on file  Social History Narrative   Patient is right handed, resides in home with husband and daughter. She consumes 16 oz of tea daily.   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    Past Surgical History:  Procedure Laterality Date  . CESAREAN SECTION  2004  . FRACTURE SURGERY  1973   R arm  . LAPAROTOMY Bilateral 06/14/2014   Procedure: LAPAROTOMY with right  salpingo-oophorectomy and left salpingectomy;  Surgeon: Linda Hedges, DO;  Location: [facility];  Service: Gynecology;  Laterality: Bilateral;    . TONSILLECTOMY AND ADENOIDECTOMY  1969    Family History  Problem Relation Age of Onset  . Hypertension Father   . Heart disease Father   . Diabetes Father   . CAD Father   . Stroke Maternal Grandfather   . Stroke Maternal Grandmother   . Diabetes Paternal Grandmother   . Other Cousin        bicuspid aortic valve  . Diabetes Brother     Allergies  Allergen Reactions  . Amoxicillin-Pot Clavulanate Rash  . Levaquin [Levofloxacin In D5w] Other (See Comments)    tendonopathy  . Clindamycin Itching  . Clindamycin/Lincomycin Itching  . Lactose Intolerance (Gi) Nausea Only  . Metoprolol     Weight gain/fluid retention   . Penicillin G Other (See Comments)  . Aspirin Other (See Comments)    Migraine headache  . Latex Rash  . Penicillins Swelling and Rash    Current Outpatient Medications on File Prior to Visit  Medication Sig Dispense Refill  . albuterol (PROVENTIL HFA;VENTOLIN HFA) 108 (90 Base) MCG/ACT inhaler Inhale 1-2 puffs into the lungs every 6 (six) hours as needed for wheezing or shortness of breath. 8 g 0  . ALPRAZolam (XANAX) 0.5 MG tablet Take 0.5 mg by mouth 2 (two) times daily as needed for anxiety.     . furosemide (LASIX) 20 MG tablet 1 TABLET BY MOUTH DAILY AS NEEDED 30 tablet 3  . lisinopril (PRINIVIL,ZESTRIL) 40 MG tablet 1 tablet by mouth now and then 1 tablet daily 90 tablet 3  . [DISCONTINUED] DULoxetine (CYMBALTA) 30 MG capsule Take 60 mg by mouth daily.     . [DISCONTINUED] topiramate (TOPAMAX) 25 MG capsule Take 75 mg by mouth daily.      No current facility-administered medications on file prior to visit.     BP (!) 132/59   Pulse 72   Temp 98.4 F (36.9 C) (Oral)   Resp 16   Ht 5\' 7"  (1.702 m)   Wt 193 lb 9.6 oz (87.8 kg)   LMP 05/10/2014 (Approximate)   SpO2 100%   BMI 30.32 kg/m       Objective:   Physical Exam  General Mental Status- Alert. General Appearance- Not in acute distress.   Skin General: Color- Normal Color.  Moisture- Normal Moisture.  Neck Carotid Arteries- Normal color. Moisture- Normal Moisture. No carotid bruits. No JVD. Faint cervical spine tender. Trapezius muscle tender.  Chest and Lung Exam Auscultation: Breath Sounds:-Normal.  Cardiovascular Auscultation:Rythm- Regular. Murmurs & Other Heart Sounds:Auscultation of the heart reveals- No Murmurs.  Abdomen Inspection:-Inspeection Normal. Palpation/Percussion:Note:No mass. Palpation and Percussion of the abdomen reveal- Non Tender, Non Distended + BS, no rebound or guarding.  [place]), symmetric smile. Strength:- 5/5 equal and symmetric strength both upper and lower  extremities.  Hands- left hand 4th and 5th digit mild tingle on phalens test. Rt side-neg phalens.  Sharp dull discrimination decreased at pad only    CN III- XII grossly intact      Assessment & Plan:  Some of your symptoms appear possible carpel tunnel like. So will give you a wrist cock up splint and try to use at all times. Nsaids are recommended as conservative treatment but your ha history may prohibit use. Will try to send Dr. Charlett Blake note and see if she is ok with low dose/brief use. Will see if Dr. Charlett Blake wants to defer to your former neurologist.  Will get cspine xray to assess joint space and foramen spaces.   Follow up in 2 weeks or as needed.  Mackie Pai, PA-C

## 2018-02-25 NOTE — Telephone Encounter (Signed)
Will you call pt and let her know Dr. Charlett Blake recommend icing inside aspect just above her wrist twice daily for 15 minutes and can apply salon pas patch to that area. Continue the splint. Not to use any oral medications presently.(no nsaids or tylenol)

## 2018-02-27 ENCOUNTER — Encounter: Payer: Self-pay | Admitting: Medical

## 2018-03-08 ENCOUNTER — Ambulatory Visit: Payer: BC Managed Care – PPO | Admitting: Family Medicine

## 2018-03-15 ENCOUNTER — Ambulatory Visit: Payer: BC Managed Care – PPO | Admitting: Family Medicine

## 2018-03-15 ENCOUNTER — Encounter: Payer: Self-pay | Admitting: Family Medicine

## 2018-03-15 VITALS — BP 144/96 | HR 65 | Temp 98.5°F | Resp 18 | Ht 67.0 in | Wt 191.4 lb

## 2018-03-15 DIAGNOSIS — G8929 Other chronic pain: Secondary | ICD-10-CM

## 2018-03-15 DIAGNOSIS — M25561 Pain in right knee: Secondary | ICD-10-CM | POA: Diagnosis not present

## 2018-03-15 DIAGNOSIS — R739 Hyperglycemia, unspecified: Secondary | ICD-10-CM | POA: Diagnosis not present

## 2018-03-15 DIAGNOSIS — S14109D Unspecified injury at unspecified level of cervical spinal cord, subsequent encounter: Secondary | ICD-10-CM | POA: Diagnosis not present

## 2018-03-15 DIAGNOSIS — I10 Essential (primary) hypertension: Secondary | ICD-10-CM

## 2018-03-15 NOTE — Progress Notes (Signed)
Subjective:  I acted as a Education administrator for Dr. Charlett Blake. [place], [place]  Patient ID: Megan May, female    DOB: 07/11/1961, 57 y.o.   MRN: [id number]  No chief complaint on file.   HPI  Patient is in today for a 2 week follow up on a She was in with complaints of numbness in right fifth finger and imaging of cervical spine showed a lucency that could be a fracture. She was encouraged to proceed with advanced imaging but because she now believes that she suffered at work back in January when one of her 57 yo pupils hit her hard in the back of the neck.visit a couple of weeks ago. No polyuria or incontinence. Denies CP/palp/SOB/HA/congestion/fevers/GI or GU c/o. Taking meds as prescribed. Also notes worsening right knee pain, no swelling, redness or warmth.  Patient Care Team: Mosie Lukes, MD as PCP - General (Family Medicine)   Past Medical History:  Diagnosis Date  . Anxiety   . Asthma   . Depression    history of   . Eosinophilic esophagitis   . Fatigue 07/02/2017  . Generalized anxiety disorder   . Hyperglycemia 03/15/2016  . Hyperlipidemia, mixed 06/30/2016  . Hypersomnia, recurrent   . Hypertension   . Migraine   . Migraines   . Posttraumatic stress disorder     Past Surgical History:  Procedure Laterality Date  . CESAREAN SECTION  2004  . FRACTURE SURGERY  1973   R arm  . LAPAROTOMY Bilateral 06/14/2014   Procedure: LAPAROTOMY with right  salpingo-oophorectomy and left salpingectomy;  Surgeon: Linda Hedges, DO;  Location: [facility];  Service: Gynecology;  Laterality: Bilateral;  . TONSILLECTOMY AND ADENOIDECTOMY  1969    Family History  Problem Relation Age of Onset  . Hypertension Father   . Heart disease Father   . Diabetes Father   . CAD Father   . Stroke Maternal Grandfather   . Stroke Maternal Grandmother   . Diabetes Paternal Grandmother   . Other Cousin        bicuspid aortic valve  . Diabetes Brother     Social History   Socioeconomic History    . Marital status: Married    Spouse name: Randall Hiss  . Number of children: 1  . Years of education: 2 Masters  . Highest education level: Not on file  Occupational History  . Occupation: SPECIAL EDUCATOR    Employer: [employer]    Comment: Teacher, early years/pre  Social Needs  . Financial resource strain: Not on file  . Food insecurity:    Worry: Not on file    Inability: Not on file  . Transportation needs:    Medical: Not on file    Non-medical: Not on file  Tobacco Use  . Smoking status: Never Smoker  . Smokeless tobacco: Never Used  Substance and Sexual Activity  . Alcohol use: No  . Drug use: No  . Sexual activity: Yes    Birth control/protection: Surgical  Lifestyle  . Physical activity:    Days per week: Not on file    Minutes per session: Not on file  . Stress: Not on file  Relationships  . Social connections:    Talks on phone: Not on file    Gets together: Not on file    Attends religious service: Not on file    Active member of club or organization: Not on file    Attends meetings of clubs or organizations: Not on file  Relationship status: Not on file  . Intimate partner violence:    Fear of current or ex partner: Not on file    Emotionally abused: Not on file    Physically abused: Not on file    Forced sexual activity: Not on file  Other Topics Concern  . Not on file  Social History Narrative   Patient is right handed, resides in home with husband and daughter. She consumes 16 oz of tea daily.   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    Outpatient Medications Prior to Visit  Medication Sig Dispense Refill  . albuterol (PROVENTIL HFA;VENTOLIN HFA) 108 (90 Base) MCG/ACT inhaler Inhale 1-2 puffs into the lungs every 6 (six) hours as needed for wheezing or shortness of breath. 8 g 0  . ALPRAZolam (XANAX) 0.5 MG tablet Take 0.5 mg by mouth 2 (two) times daily as needed for anxiety.     Marland Kitchen dexlansoprazole (DEXILANT) 60 MG capsule     .  furosemide (LASIX) 20 MG tablet 1 TABLET BY MOUTH DAILY AS NEEDED 30 tablet 3  . lisinopril (PRINIVIL,ZESTRIL) 40 MG tablet 1 tablet by mouth now and then 1 tablet daily 90 tablet 3   No facility-administered medications prior to visit.     Allergies  Allergen Reactions  . Amoxicillin-Pot Clavulanate Rash  . Levaquin [Levofloxacin In D5w] Other (See Comments)    tendonopathy  . Clindamycin Itching  . Clindamycin/Lincomycin Itching  . Lactose Intolerance (Gi) Nausea Only  . Metoprolol     Weight gain/fluid retention   . Penicillin G Other (See Comments)  . Aspirin Other (See Comments)    Migraine headache  . Latex Rash  . Penicillins Swelling and Rash    Review of Systems  Constitutional: Positive for malaise/fatigue. Negative for fever.  HENT: Negative for congestion.   Eyes: Negative for blurred vision.  Respiratory: Negative for shortness of breath.   Cardiovascular: Negative for chest pain, palpitations and leg swelling.  Gastrointestinal: Negative for abdominal pain, blood in stool and nausea.  Genitourinary: Negative for dysuria and frequency.  Musculoskeletal: Positive for joint pain and neck pain. Negative for falls.  Skin: Negative for rash.  Neurological: Positive for sensory change. Negative for dizziness, loss of consciousness and headaches.  Endo/Heme/Allergies: Negative for environmental allergies.  Psychiatric/Behavioral: Negative for depression. The patient is nervous/anxious.        Objective:    Physical Exam  Constitutional: She is oriented to person, place, and time. She appears well-developed and well-nourished. No distress.  HENT:  Head: Normocephalic and atraumatic.  Nose: Nose normal.  Eyes: Right eye exhibits no discharge. Left eye exhibits no discharge.  Neck: Normal range of motion. Neck supple.  Cardiovascular: Normal rate and regular rhythm.  No murmur heard. Pulmonary/Chest: Effort normal and breath sounds normal.  Abdominal: Soft. Bowel  sounds are normal. There is no tenderness.  Musculoskeletal: She exhibits no edema.  Neurological: She is alert and oriented to person, place, and time.  Skin: Skin is warm and dry.  Psychiatric: She has a normal mood and affect.  Nursing note and vitals reviewed.   BP (!) 144/96 (BP Location: Left Arm, Patient Position: Sitting, Cuff Size: Normal)   Pulse 65   Temp 98.5 F (36.9 C) (Oral)   Resp 18   Ht 5\' 7"  (1.702 m)   Wt 191 lb 6.4 oz (86.8 kg)   LMP 05/10/2014 (Approximate)   SpO2 97%   BMI 29.98 kg/m  Wt Readings from Last  3 Encounters:  03/15/18 191 lb 6.4 oz (86.8 kg)  02/24/18 193 lb 9.6 oz (87.8 kg)  07/30/17 191 lb (86.6 kg)   BP Readings from Last 3 Encounters:  03/15/18 (!) 144/96  02/24/18 (!) 132/59  07/30/17 (!) 142/70     Immunization History  Administered Date(s) Administered  . Influenza, Seasonal, Injecte, Preservative Fre 05/10/2015  . Influenza,inj,Quad PF,6+ Mos 05/02/2014, 05/30/2017  . Influenza-Unspecified 05/04/2016    Health Maintenance  Topic Date Due  . Hepatitis C Screening  [birth year]  . HIV Screening  12/17/1976  . TETANUS/TDAP  12/17/1980  . PAP SMEAR  11/20/2016  . INFLUENZA VACCINE  03/03/2018  . MAMMOGRAM  02/23/2020  . COLONOSCOPY  05/02/2024    Lab Results  Component Value Date   WBC 7.0 07/02/2017   HGB 13.1 07/02/2017   HCT 38.2 07/02/2017   PLT 208 07/02/2017   GLUCOSE 96 07/02/2017   CHOL 199 06/18/2016   TRIG 194.0 (H) 06/18/2016   HDL 61.00 06/18/2016   LDLCALC 99 06/18/2016   ALT 12 06/18/2016   AST 17 06/18/2016   NA 139 07/02/2017   K 4.0 07/02/2017   CL 103 07/02/2017   CREATININE 0.67 07/02/2017   BUN 10 07/02/2017   CO2 22 07/02/2017   TSH 1.510 07/02/2017   HGBA1C 5.3 06/18/2016    Lab Results  Component Value Date   TSH 1.510 07/02/2017   Lab Results  Component Value Date   WBC 7.0 07/02/2017   HGB 13.1 07/02/2017   HCT 38.2 07/02/2017   MCV 85 07/02/2017   PLT 208 07/02/2017   Lab  Results  Component Value Date   NA 139 07/02/2017   K 4.0 07/02/2017   CO2 22 07/02/2017   GLUCOSE 96 07/02/2017   BUN 10 07/02/2017   CREATININE 0.67 07/02/2017   BILITOT 0.8 06/18/2016   ALKPHOS 74 06/18/2016   AST 17 06/18/2016   ALT 12 06/18/2016   PROT 6.7 06/18/2016   ALBUMIN 4.4 06/18/2016   CALCIUM 9.5 07/02/2017   ANIONGAP 8 01/04/2016   GFR 78.17 06/18/2016   Lab Results  Component Value Date   CHOL 199 06/18/2016   Lab Results  Component Value Date   HDL 61.00 06/18/2016   Lab Results  Component Value Date   LDLCALC 99 06/18/2016   Lab Results  Component Value Date   TRIG 194.0 (H) 06/18/2016   Lab Results  Component Value Date   CHOLHDL 3 06/18/2016   Lab Results  Component Value Date   HGBA1C 5.3 06/18/2016         Assessment & Plan:   Problem List Items Addressed This Visit    Essential hypertension, benign    Well controlled, no changes to meds. Encouraged heart healthy diet such as the DASH diet and exercise as tolerated.       Hyperglycemia    hgba1c acceptable, minimize simple carbs. Increase exercise as tolerated. She agrees to labs with next visit      Chronic pain of right knee - Primary    No recent trauma but continues to worsen is referred back to [facility] for further evaluation.      Relevant Orders   Ambulatory referral to Sports Medicine   Injury of cervical spine ([place])    She was in with complaints of numbness in right fifth finger and imaging of cervical spine showed a lucency that could be a fracture. She was encouraged to proceed with advanced imaging but because she  now believes that she suffered at work back in January when one of her 57 yo pupils hit her hard in the back of the neck. She is trying to get this managed through workman's comp. She is advised no strenuous activity or heavy lifting until this is further evaluated and she will call if she is ready to proceed with imaging here. Seek care if symptoms  worsen. Spent 25 minutes with pain in examination and discussing options for care.          I am having Megan May maintain her ALPRAZolam, albuterol, lisinopril, furosemide, and dexlansoprazole.  No orders of the defined types were placed in this encounter.   CMA served as Education administrator during this visit. History, Physical and Plan performed by medical provider. Documentation and orders reviewed and attested to.  Penni Homans, MD

## 2018-03-15 NOTE — Patient Instructions (Signed)
Cervical Spine Fracture, Stable A cervical spine fracture is a break or crack in one of the bones of the neck. If there is a very low risk of problems happening during healing, the fracture is considered stable. What are the causes? This condition may be caused by:  Motor vehicle accidents.  Injuries from sports such as diving, football, biking, wrestling, or skiing.  Severe osteoporosis or other bone diseases, such as cancers that spread to bone or metabolic abnormalities that cause bone weakness.  What are the signs or symptoms? Symptoms of this condition include:  Severe neck pain after an accident or fall. Pain may spread down the shoulders or arms.  Bruising or swelling on the back of the neck.  Numbness, tingling, sudden muscle tightening (spasms), or weakness in the arms, legs, or both.  How is this diagnosed? This condition may be diagnosed based on:  Your medical history.  A physical exam of your neck, arms, and legs.  Imaging studies of the neck, such as: ? X-rays. ? CT scan. ? MRI.  How is this treated? This condition is treated with a neck brace or cervical collar to keep your neck from moving during the healing process. A cervical collar is a device that supports your chin and the back of your head. You may also be given medicine to help relieve pain. Follow these instructions at home: If you have a neck brace:  Wear the brace as told by your health care provider. Remove it only as told by your health care provider.  If you experience numbness or tingling, loosen the brace.  Keep the brace clean.  If the brace is not waterproof: ? Do not let it get wet. ? Cover it with a watertight covering when you take a bath or a shower. If you have a cervical collar:  Do not remove the collar unless your health care provider tells you to do this. If you are allowed to remove the collar for cleaning and bathing: ? Follow your health care provider's instructions about  how to safely take off the collar. ? Wash and thoroughly dry the skin on your neck. Check your skin for irritation or sores. If you see any, tell your health care provider.  Ask your health care provider before making any adjustments to your collar. Small adjustments may be needed over time to improve comfort and reduce pressure on your chin or on the back of your head.  Keep long hair outside of the collar.  Keep your collar clean by wiping it with mild soap and water and letting it air-dry completely. The pads can be hand-washed with soap and water and air-dried completely. Managing pain, stiffness, and swelling  If directed, put ice on the injured area: ? If you have a removable brace or cervical collar, remove it as told by your health care provider. ? Put ice in a plastic bag. ? Place a towel between your skin and the bag. ? Leave the ice on for 20 minutes, 2-3 times a day. Activity  Do not drive a car until your health care provider approves.  Do not drive or use heavy machinery while taking prescription pain medicine.  Avoid physical activity for as long as directed. Ask your health care provider what activities are safe for you. General instructions  Take over-the-counter and prescription medicines only as told by your health care provider.  Do not take baths, swim, or use a hot tub until your health care provider approves. Ask  your health care provider if you can take showers. You may only be allowed to take sponge baths for bathing.  Do not use any products that contain nicotine or tobacco, such as cigarettes and e-cigarettes. These can delay bone healing. If you need help quitting, ask your health care provider.  Keep all follow-up visits as told by your health care provider. This is important to help prevent long-term (chronic) or permanent injury, pain, and disability. You may need to have follow-up X-rays or MRI 1-3 weeks after your injury. Contact a health care provider  if:  You have irritation or sores on your skin from your brace or cervical collar. Get help right away if:  You have neck pain that gets worse.  You develop difficulties swallowing or breathing.  You develop swelling in your neck.  You have any of the following problems in your arms, legs, or both: ? Numbness. ? Weakness. ? Burning pain. ? Movement problems.  You are unable to control when you urinate or have a bowel movement (incontinence).  You have problems with coordination or difficulty walking. This information is not intended to replace advice given to you by your health care provider. Make sure you discuss any questions you have with your health care provider. Document Released: 06/06/2004 Document Revised: 04/23/2016 Document Reviewed: 04/23/2016 Elsevier Interactive Patient Education  [employer].

## 2018-03-16 ENCOUNTER — Telehealth: Payer: Self-pay | Admitting: *Deleted

## 2018-03-16 DIAGNOSIS — G8929 Other chronic pain: Secondary | ICD-10-CM | POA: Insufficient documentation

## 2018-03-16 DIAGNOSIS — M25561 Pain in right knee: Principal | ICD-10-CM

## 2018-03-16 DIAGNOSIS — S14109A Unspecified injury at unspecified level of cervical spinal cord, initial encounter: Secondary | ICD-10-CM | POA: Insufficient documentation
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
## 2018-03-16 NOTE — Assessment & Plan Note (Signed)
[id number] acceptable, minimize simple carbs. Increase exercise as tolerated. She agrees to labs with next visit

## 2018-03-16 NOTE — Assessment & Plan Note (Signed)
No recent trauma but continues to worsen is referred back to [facility] for further evaluation.

## 2018-03-16 NOTE — Telephone Encounter (Signed)
Received request for Medical Records from [facility]; forwarded to [place] for email/scan/SLS 08/14

## 2018-03-16 NOTE — Assessment & Plan Note (Signed)
Well controlled, no changes to meds. Encouraged heart healthy diet such as the DASH diet and exercise as tolerated.  °

## 2018-03-24 NOTE — Progress Notes (Signed)
[facility] [address], [place] [address] Phone: [phone] Subjective:     CC: Right knee pain  [id number]  Megan May is a 57 y.o. female coming in with complaint of right knee. No numbness and tingling or radiating pain.   Onset- 1 year Location- lateral knee  Character- sharp Aggravating factors- Stairs makes the knee painful Reliving factors- Ice, heat Severity-5 out of 10 Patient does not remember any true injury recently.  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    Past Medical History:  Diagnosis Date  . Anxiety   . Asthma   . Depression    history of   . Eosinophilic esophagitis   . Fatigue 07/02/2017  . Generalized anxiety disorder   . Hyperglycemia 03/15/2016  . Hyperlipidemia, mixed 06/30/2016  . Hypersomnia, recurrent   . Hypertension   . Migraine   . Migraines   . Posttraumatic stress disorder    Past Surgical History:  Procedure Laterality Date  . CESAREAN SECTION  2004  . FRACTURE SURGERY  1973   R arm  . LAPAROTOMY Bilateral 06/14/2014   Procedure: LAPAROTOMY with right  salpingo-oophorectomy and left salpingectomy;  Surgeon: Linda Hedges, DO;  Location: [facility];  Service: Gynecology;  Laterality: Bilateral;  . TONSILLECTOMY AND ADENOIDECTOMY  1969   Social History   Socioeconomic History  . Marital status: Married    Spouse name: Randall Hiss  . Number of children: 1  . Years of education: 2 Masters  . Highest education level: Not on file  Occupational History  . Occupation: SPECIAL EDUCATOR    Employer: [employer]    Comment: Teacher, early years/pre  Social Needs  . Financial resource strain: Not on file  . Food insecurity:    Worry: Not on file    Inability: Not on file  . Transportation needs:    Medical: Not on file    Non-medical: Not on file  Tobacco Use  . Smoking status: Never Smoker  . Smokeless tobacco: Never Used  Substance and Sexual Activity  . Alcohol use: No    . Drug use: No  . Sexual activity: Yes    Birth control/protection: Surgical  Lifestyle  . Physical activity:    Days per week: Not on file    Minutes per session: Not on file  . Stress: Not on file  Relationships  . Social connections:    Talks on phone: Not on file    Gets together: Not on file    Attends religious service: Not on file    Active member of club or organization: Not on file    Attends meetings of clubs or organizations: Not on file    Relationship status: Not on file  Other Topics Concern  . Not on file  Social History Narrative   Patient is right handed, resides in home with husband and daughter. She consumes 16 oz of tea daily.   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   Allergies  Allergen Reactions  . Amoxicillin-Pot Clavulanate Rash  . Levaquin [Levofloxacin In D5w] Other (See Comments)    tendonopathy  . Clindamycin Itching  . Clindamycin/Lincomycin Itching  . Lactose Intolerance (Gi) Nausea Only  . Metoprolol     Weight gain/fluid retention   . Penicillin G Other (See Comments)  . Aspirin Other (See Comments)    Migraine headache  . Latex Rash  . Penicillins Swelling and Rash   Family History  Problem  Relation Age of Onset  . Hypertension Father   . Heart disease Father   . Diabetes Father   . CAD Father   . Stroke Maternal Grandfather   . Stroke Maternal Grandmother   . Diabetes Paternal Grandmother   . Other Cousin        bicuspid aortic valve  . Diabetes Brother      Past medical history, social, surgical and family history all reviewed in electronic medical record.  No pertanent information unless stated regarding to the chief complaint.   Review of Systems:Review of systems updated and as accurate as of 03/25/18  No headache, visual changes, nausea, vomiting, diarrhea, constipation, dizziness, abdominal pain, skin rash, fevers, chills, night sweats, weight loss, swollen lymph nodes, body aches, joint swelling,  chest  pain, shortness of breath, mood changes.  Positive muscle aches  Objective  Blood pressure 130/80, pulse 66, height 5\' 7"  (1.702 m), weight 192 lb (87.1 kg), last menstrual period 05/10/2014, SpO2 98 %. Systems examined below as of 03/25/18   General: No apparent distress alert and oriented x3 mood and affect normal, dressed appropriately.  HEENT: Pupils equal, extraocular movements intact  Respiratory: Patient's speak in full sentences and does not appear short of breath  Cardiovascular: No lower extremity edema, non tender, no erythema  Skin: Warm dry intact with no signs of infection or rash on extremities or on axial skeleton.  Abdomen: Soft nontender  Neuro: Cranial nerves II through XII are intact, neurovascularly intact in all extremities with 2+ DTRs and 2+ pulses.  Lymph: No lymphadenopathy of posterior or anterior cervical chain or axillae bilaterally.  Gait normal with good balance and coordination.  MSK:  Non tender with full range of motion and good stability and symmetric strength and tone of shoulders, elbows, wrist, hip and ankles bilaterally.  Knee: Right Normal to inspection with no erythema or effusion or obvious bony abnormalities. Palpation tender over the patellofemoral lateral tracking noted with range of motion. ROM full in flexion and extension and lower leg rotation. Ligaments with solid consistent endpoints including ACL, PCL, LCL, MCL. Negative Mcmurray's, Apley's, and Thessalonian tests. painful patellar compression. Patellar glide without crepitus. Patellar and quadriceps tendons unremarkable. Hamstring and quadriceps strength is normal. Contralateral knee unremarkable  MSK US performed of: Right knee This study was ordered, performed, and interpreted by Charlann Boxer D.O.  Knee: Right knee shows the patient has some mild patellofemoral arthritis.  Patient's medial and lateral meniscus are unremarkable.  Patient has no significant arthritis of the other  compartments.  IMPRESSION: Patient has mild patellofemoral arthritis  [id number]; 15 additional minutes spent for Therapeutic exercises as stated in above notes.  This included exercises focusing on stretching, strengthening, with significant focus on eccentric aspects.   Long term goals include an improvement in range of motion, strength, endurance as well as avoiding reinjury. Patient's frequency would include in 1-2 times a day, 3-5 times a week for a duration of 6-12 weeks.   Patellofemoral Syndrome  Reviewed anatomy using anatomical model and how PFS occurs.  Given rehab exercises handout for VMO, hip abductors, core, entire kinetic chain including proprioception exercises including cone touches, step downs, hip elevations and turn outs.  Could benefit from PT, regular exercise, upright biking, and a PFS knee brace to assist with tracking abnormalities. Proper technique shown and discussed handout in great detail with ATC.  All questions were discussed and answered.     Impression and Recommendations:     This case required medical  decision making of moderate complexity.      Note: This dictation was prepared with Dragon dictation along with smaller phrase technology. Any transcriptional errors that result from this process are unintentional.

## 2018-03-25 ENCOUNTER — Encounter: Payer: Self-pay | Admitting: Family Medicine

## 2018-03-25 ENCOUNTER — Ambulatory Visit: Payer: BC Managed Care – PPO | Admitting: Family Medicine

## 2018-03-25 ENCOUNTER — Ambulatory Visit: Payer: Self-pay

## 2018-03-25 ENCOUNTER — Ambulatory Visit (INDEPENDENT_AMBULATORY_CARE_PROVIDER_SITE_OTHER)
Admission: RE | Admit: 2018-03-25 | Discharge: 2018-03-25 | Disposition: A | Discharge status: Home / Self Care | Payer: BC Managed Care – PPO | Source: Ambulatory Visit | Attending: Family Medicine | Admitting: Family Medicine

## 2018-03-25 VITALS — BP 130/80 | HR 66 | Ht 67.0 in | Wt 192.0 lb

## 2018-03-25 DIAGNOSIS — M25561 Pain in right knee: Secondary | ICD-10-CM

## 2018-03-25 DIAGNOSIS — G8929 Other chronic pain: Secondary | ICD-10-CM | POA: Diagnosis not present

## 2018-03-25 DIAGNOSIS — M1711 Unilateral primary osteoarthritis, right knee: Secondary | ICD-10-CM | POA: Diagnosis not present

## 2018-03-25 MED ORDER — VITAMIN D (ERGOCALCIFEROL) 1.25 MG (50000 UNIT) PO CAPS: 50000.0000 [IU] | ORAL_CAPSULE | ORAL | 0 refills | Status: DC

## 2018-03-25 NOTE — Assessment & Plan Note (Addendum)
Mild.  Discussed icing regimen and home exercise.  We discussed which activities to do which wants to avoid.  Work with Product/process development scientist to learn them in greater detail.  Topical anti-inflammatories given.  Patient is to increase activity as tolerated.  Follow-up again in 4 to 6 weeks

## 2018-03-25 NOTE — Patient Instructions (Addendum)
Good to see you  Ice 20 minutes 2 times daily. Usually after activity and before bed. pennsaid pinkie amount topically 2 times daily as needed.  Wear brace with a lot of activity and with running  Would consider more biking then running for now Once weekly vitamin D for 12 weeks See me again in 4-5 weeks

## 2018-04-12 ENCOUNTER — Ambulatory Visit: Payer: BC Managed Care – PPO | Admitting: Family Medicine

## 2018-04-18 ENCOUNTER — Telehealth: Payer: Self-pay | Admitting: Family Medicine

## 2018-04-18 NOTE — Telephone Encounter (Signed)
Copied from [place] [id number]. Topic: Quick Communication - See Telephone Encounter >> Apr 18, 2018  5:35 PM Blase Mess A wrote: CRM for notification. See Telephone encounter for: 04/18/18.  Patient is receiving conflicting information from workman comp and Dr Randel Pigg  and patient would really like Dr Randel Pigg to advise. Patient would like further understand. Please call back [phone]

## 2018-04-19 NOTE — Progress Notes (Signed)
[facility] [address] [place], [place] [address] Phone: [phone] Subjective:   Fontaine No, am serving as a scribe for Dr. Hulan Saas.    CC: Knee pain follow-up  [id number]  Megan May is a 57 y.o. female coming in with complaint of knee pain. She did some of the exercises and they did help. Dull constant pain but has sharp pain with stair climbing and with kneeling when that knee is on the ground. Has been using vitamin D.  States that the pain is not as severe as when putting the pressure on the knee.       Past Medical History:  Diagnosis Date  . Anxiety   . Asthma   . Depression    history of   . Eosinophilic esophagitis   . Fatigue 07/02/2017  . Generalized anxiety disorder   . Hyperglycemia 03/15/2016  . Hyperlipidemia, mixed 06/30/2016  . Hypersomnia, recurrent   . Hypertension   . Migraine   . Migraines   . Posttraumatic stress disorder    Past Surgical History:  Procedure Laterality Date  . CESAREAN SECTION  2004  . FRACTURE SURGERY  1973   R arm  . LAPAROTOMY Bilateral 06/14/2014   Procedure: LAPAROTOMY with right  salpingo-oophorectomy and left salpingectomy;  Surgeon: Linda Hedges, DO;  Location: [facility];  Service: Gynecology;  Laterality: Bilateral;  . TONSILLECTOMY AND ADENOIDECTOMY  1969   Social History   Socioeconomic History  . Marital status: Married    Spouse name: Randall Hiss  . Number of children: 1  . Years of education: 2 Masters  . Highest education level: Not on file  Occupational History  . Occupation: SPECIAL EDUCATOR    Employer: [employer]    Comment: Teacher, early years/pre  Social Needs  . Financial resource strain: Not on file  . Food insecurity:    Worry: Not on file    Inability: Not on file  . Transportation needs:    Medical: Not on file    Non-medical: Not on file  Tobacco Use  . Smoking status: Never Smoker  . Smokeless tobacco: Never Used  Substance  and Sexual Activity  . Alcohol use: No  . Drug use: No  . Sexual activity: Yes    Birth control/protection: Surgical  Lifestyle  . Physical activity:    Days per week: Not on file    Minutes per session: Not on file  . Stress: Not on file  Relationships  . Social connections:    Talks on phone: Not on file    Gets together: Not on file    Attends religious service: Not on file    Active member of club or organization: Not on file    Attends meetings of clubs or organizations: Not on file    Relationship status: Not on file  Other Topics Concern  . Not on file  Social History Narrative   Patient is right handed, resides in home with husband and daughter. She consumes 16 oz of tea daily.   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   Allergies  Allergen Reactions  . Amoxicillin-Pot Clavulanate Rash  . Levaquin [Levofloxacin In D5w] Other (See Comments)    tendonopathy  . Clindamycin Itching  . Clindamycin/Lincomycin Itching  . Lactose Intolerance (Gi) Nausea Only  . Metoprolol     Weight gain/fluid retention   . Penicillin G Other (See Comments)  . Aspirin Other (See Comments)  Migraine headache  . Latex Rash  . Penicillins Swelling and Rash   Family History  Problem Relation Age of Onset  . Hypertension Father   . Heart disease Father   . Diabetes Father   . CAD Father   . Stroke Maternal Grandfather   . Stroke Maternal Grandmother   . Diabetes Paternal Grandmother   . Other Cousin        bicuspid aortic valve  . Diabetes Brother      Current Outpatient Medications (Cardiovascular):  .  furosemide (LASIX) 20 MG tablet, 1 TABLET BY MOUTH DAILY AS NEEDED .  lisinopril (PRINIVIL,ZESTRIL) 40 MG tablet, 1 tablet by mouth now and then 1 tablet daily  Current Outpatient Medications (Respiratory):  .  albuterol (PROVENTIL HFA;VENTOLIN HFA) 108 (90 Base) MCG/ACT inhaler, Inhale 1-2 puffs into the lungs every 6 (six) hours as needed for wheezing or shortness  of breath.    Current Outpatient Medications (Other):  Marland Kitchen  ALPRAZolam (XANAX) 0.5 MG tablet, Take 0.5 mg by mouth 2 (two) times daily as needed for anxiety.  Marland Kitchen  dexlansoprazole (DEXILANT) 60 MG capsule,  .  Vitamin D, Ergocalciferol, (DRISDOL) 50000 units CAPS capsule, Take 1 capsule (50,000 Units total) by mouth every 7 (seven) days.    Past medical history, social, surgical and family history all reviewed in electronic medical record.  No pertanent information unless stated regarding to the chief complaint.   Review of Systems:  No headache, visual changes, nausea, vomiting, diarrhea, constipation, dizziness, abdominal pain, skin rash, fevers, chills, night sweats, weight loss, swollen lymph nodes, body aches, joint swelling,  chest pain, shortness of breath, mood changes.  Positive muscle aches  Objective  Blood pressure (!) 132/98, pulse 65, height 5\' 7"  (1.702 m), weight 194 lb (88 kg), last menstrual period 05/10/2014, SpO2 98 %.   General: No apparent distress alert and oriented x3 mood and affect normal, dressed appropriately.  HEENT: Pupils equal, extraocular movements intact  Respiratory: Patient's speak in full sentences and does not appear short of breath  Cardiovascular: No lower extremity edema, non tender, no erythema  Skin: Warm dry intact with no signs of infection or rash on extremities or on axial skeleton.  Abdomen: Soft nontender  Neuro: Cranial nerves II through XII are intact, neurovascularly intact in all extremities with 2+ DTRs and 2+ pulses.  Lymph: No lymphadenopathy of posterior or anterior cervical chain or axillae bilaterally.  Gait normal with good balance and coordination.  MSK:  Non tender with full range of motion and good stability and symmetric strength and tone of shoulders, elbows, wrist, hip, and ankles bilaterally.  Knee exam on inspection has no significant deformity.  Mild lateral tracking with range of motion of the patella.  Positive grind  test.  Mild pain over the medial joint line as well.  No significant instability  After informed written and verbal consent, patient was seated on exam table. Right knee was prepped with alcohol swab and utilizing anterolateral approach, patient's right knee space was injected with 4:1  marcaine 0.5%: Kenalog 40mg /dL. Patient tolerated the procedure well without immediate complications.   Impression and Recommendations:     The above documentation has been reviewed and is accurate and complete Lyndal Pulley, DO       Note: This dictation was prepared with Dragon dictation along with smaller phrase technology. Any transcriptional errors that result from this process are unintentional.

## 2018-04-20 ENCOUNTER — Encounter: Payer: Self-pay | Admitting: Family Medicine

## 2018-04-20 ENCOUNTER — Telehealth: Payer: Self-pay | Admitting: Family Medicine

## 2018-04-20 ENCOUNTER — Ambulatory Visit (INDEPENDENT_AMBULATORY_CARE_PROVIDER_SITE_OTHER): Payer: BC Managed Care – PPO | Admitting: Family Medicine

## 2018-04-20 VITALS — BP 132/98 | HR 65 | Ht 67.0 in | Wt 194.0 lb

## 2018-04-20 DIAGNOSIS — M25561 Pain in right knee: Secondary | ICD-10-CM | POA: Diagnosis not present

## 2018-04-20 DIAGNOSIS — M1711 Unilateral primary osteoarthritis, right knee: Secondary | ICD-10-CM | POA: Diagnosis not present

## 2018-04-20 NOTE — Patient Instructions (Signed)
Good to see you  Injected the knee today  PT will be calling you  Continue the vitamin D  Stay active Continue everything else we are doing  See me again in 6 weeks

## 2018-04-20 NOTE — Telephone Encounter (Signed)
Spoke with patient she stated after her first scan PCP recommended CT scan, patient states Workers comp does not want her to do a CT scan but go straight to see an orthopedic.  Please advise  Patient will see Melissa next week to discuss

## 2018-04-20 NOTE — Telephone Encounter (Signed)
Copied from [place] [id number]. Topic: Quick Communication - See Telephone Encounter >> Apr 20, 2018  4:20 PM Rutherford Nail, [place] wrote: CRM for notification. See Telephone encounter for: 04/20/18. Patient calling and states that she was just seen by Dr Tamala Julian and was supposed to be given a list of recommended orthotic inserts and was not given this list at her appointment. Would like a call back to get this list. Please advise.

## 2018-04-20 NOTE — Assessment & Plan Note (Signed)
Moderate arthritis noted in the patellofemoral joint on ultrasound and x-rays.  Discussed with patient about icing regimen and home exercises.  Patient will be sent to formal physical therapy that I think will be beneficial.  Action given as well today.  Worsening pain will consider advanced imaging but I do not think will be necessary.  Sent to physical therapy.  Follow-up 4 to 6 weeks

## 2018-04-21 NOTE — Telephone Encounter (Signed)
Called pt, unable to leave msg due to mailbox being full.

## 2018-04-21 NOTE — Telephone Encounter (Signed)
[facility] "total support" online would be great

## 2018-04-25 ENCOUNTER — Ambulatory Visit: Payer: BC Managed Care – PPO | Admitting: Family

## 2018-05-03 ENCOUNTER — Encounter: Payer: Self-pay | Admitting: Family Medicine

## 2018-05-03 ENCOUNTER — Ambulatory Visit: Payer: BC Managed Care – PPO | Admitting: Family Medicine

## 2018-05-03 DIAGNOSIS — I10 Essential (primary) hypertension: Secondary | ICD-10-CM | POA: Diagnosis not present

## 2018-05-03 DIAGNOSIS — Z23 Encounter for immunization: Secondary | ICD-10-CM | POA: Diagnosis not present

## 2018-05-03 DIAGNOSIS — F419 Anxiety disorder, unspecified: Secondary | ICD-10-CM | POA: Diagnosis not present

## 2018-05-03 DIAGNOSIS — S14109D Unspecified injury at unspecified level of cervical spinal cord, subsequent encounter: Secondary | ICD-10-CM | POA: Diagnosis not present

## 2018-05-03 MED ORDER — TIZANIDINE HCL 4 MG PO TABS: 4.0000 mg | ORAL_TABLET | Freq: Every evening | ORAL | 0 refills | Status: DC | PRN

## 2018-05-03 NOTE — Patient Instructions (Addendum)
Tylenol/Acetaminophen ES 500 mg tabs 1 tab twice daily (max of 3000 mg in 24 hours) Shingrix is the new shingles shot. 2 shots over 2-6 months check with insurance regarding coverage Back Pain, Adult Many adults have back pain from time to time. Common causes of back pain include:  A strained muscle or ligament.  Wear and tear (degeneration) of the spinal disks.  Arthritis.  A hit to the back.  Back pain can be short-lived (acute) or last a long time (chronic). A physical exam, lab tests, and imaging studies may be done to find the cause of your pain. Follow these instructions at home: Managing pain and stiffness  Take over-the-counter and prescription medicines only as told by your health care provider.  If directed, apply heat to the affected area as often as told by your health care provider. Use the heat source that your health care provider recommends, such as a moist heat pack or a heating pad. ? Place a towel between your skin and the heat source. ? Leave the heat on for 20-30 minutes. ? Remove the heat if your skin turns bright red. This is especially important if you are unable to feel pain, heat, or cold. You have a greater risk of getting burned.  If directed, apply ice to the injured area: ? Put ice in a plastic bag. ? Place a towel between your skin and the bag. ? Leave the ice on for 20 minutes, 2-3 times a day for the first 2-3 days. Activity  Do not stay in bed. Resting more than 1-2 days can delay your recovery.  Take short walks on even surfaces as soon as you are able. Try to increase the length of time you walk each day.  Do not sit, drive, or stand in one place for more than 30 minutes at a time. Sitting or standing for long periods of time can put stress on your back.  Use proper lifting techniques. When you bend and lift, use positions that put less stress on your back: ? [place] your knees. ? Keep the load close to your body. ? Avoid twisting.  Exercise  regularly as told by your health care provider. Exercising will help your back heal faster. This also helps prevent back injuries by keeping muscles strong and flexible.  Your health care provider may recommend that you see a physical therapist. This person can help you come up with a safe exercise program. Do any exercises as told by your physical therapist. Lifestyle  Maintain a healthy weight. Extra weight puts stress on your back and makes it difficult to have good posture.  Avoid activities or situations that make you feel anxious or stressed. Learn ways to manage anxiety and stress. One way to manage stress is through exercise. Stress and anxiety increase muscle tension and can make back pain worse. General instructions  Sleep on a firm mattress in a comfortable position. Try lying on your side with your knees slightly bent. If you lie on your back, put a pillow under your knees.  Follow your treatment plan as told by your health care provider. This may include: ? Cognitive or behavioral therapy. ? Acupuncture or massage therapy. ? Meditation or yoga. Contact a health care provider if:  You have pain that is not relieved with rest or medicine.  You have increasing pain going down into your legs or buttocks.  Your pain does not improve in 2 weeks.  You have pain at night.  You lose  weight.  You have a fever or chills. Get help right away if:  You develop new bowel or bladder control problems.  You have unusual weakness or numbness in your arms or legs.  You develop nausea or vomiting.  You develop abdominal pain.  You feel faint. Summary  Many adults have back pain from time to time. A physical exam, lab tests, and imaging studies may be done to find the cause of your pain.  Use proper lifting techniques. When you bend and lift, use positions that put less stress on your back.  Take over-the-counter and prescription medicines and apply heat or ice as directed by  your health care provider. This information is not intended to replace advice given to you by your health care provider. Make sure you discuss any questions you have with your health care provider. Document Released: 07/20/2005 Document Revised: 08/24/2016 Document Reviewed: 08/24/2016 Elsevier Interactive Patient Education  2018 [employer].  twice daily (max of 3000 mg in 24 hours

## 2018-05-08 NOTE — Assessment & Plan Note (Signed)
Has been under a great deal of stress concerning her neck pain and trying to figure out how to move forward with imaging and workman's comp. She was hit in the back of the neck while at work and this likely has affected her pain so she is struggling with pushing workman's comp to do the right thing vs moving forward without them. She will let us know if she is ready for further imaging. Encouraged moist heat and gentle stretching as tolerated. May try NSAIDs and prescription meds as directed and report if symptoms worsen or seek immediate care

## 2018-05-08 NOTE — Assessment & Plan Note (Signed)
Continues to struggle with pain s/p being hit  In neck while at work. She will let us know if she is ready to proceed with imaging but fo now she is trying to work with [employer] comp. Encouraged moist heat and gentle stretching as tolerated. May try NSAIDs and prescription meds as directed and report if symptoms worsen or seek immediate care

## 2018-05-08 NOTE — Assessment & Plan Note (Signed)
Well controlled, no changes to meds. Encouraged heart healthy diet such as the DASH diet and exercise as tolerated.  °

## 2018-05-08 NOTE — Progress Notes (Signed)
Subjective:    Patient ID: Megan May, female    DOB: Mar 22, 1961, 57 y.o.   MRN: [id number]  No chief complaint on file.   HPI Patient is in today for follow up. She continues to struggle with neck pain status post being hit in back of next at work. She did not initially file a claim as she thought it would get better but it coninues to hurt. No other new concerns or falls or injury. Notes some symptoms to shoulders and arms as welll. Denies CP/palp/SOB/HA/congestion/fevers/GI or GU c/o. Taking meds as prescribed Past Medical History:  Diagnosis Date  . Anxiety   . Asthma   . Depression    history of   . Eosinophilic esophagitis   . Fatigue 07/02/2017  . Generalized anxiety disorder   . Hyperglycemia 03/15/2016  . Hyperlipidemia, mixed 06/30/2016  . Hypersomnia, recurrent   . Hypertension   . Migraine   . Migraines   . Posttraumatic stress disorder     Past Surgical History:  Procedure Laterality Date  . CESAREAN SECTION  2004  . FRACTURE SURGERY  1973   R arm  . LAPAROTOMY Bilateral 06/14/2014   Procedure: LAPAROTOMY with right  salpingo-oophorectomy and left salpingectomy;  Surgeon: Linda Hedges, DO;  Location: [facility];  Service: Gynecology;  Laterality: Bilateral;  . TONSILLECTOMY AND ADENOIDECTOMY  1969    Family History  Problem Relation Age of Onset  . Hypertension Father   . Heart disease Father   . Diabetes Father   . CAD Father   . Stroke Maternal Grandfather   . Stroke Maternal Grandmother   . Diabetes Paternal Grandmother   . Other Cousin        bicuspid aortic valve  . Diabetes Brother     Social History   Socioeconomic History  . Marital status: Married    Spouse name: Randall Hiss  . Number of children: 1  . Years of education: 2 Masters  . Highest education level: Not on file  Occupational History  . Occupation: SPECIAL EDUCATOR    Employer: [employer]    Comment: Teacher, early years/pre  Social Needs  . Financial resource  strain: Not on file  . Food insecurity:    Worry: Not on file    Inability: Not on file  . Transportation needs:    Medical: Not on file    Non-medical: Not on file  Tobacco Use  . Smoking status: Never Smoker  . Smokeless tobacco: Never Used  Substance and Sexual Activity  . Alcohol use: No  . Drug use: No  . Sexual activity: Yes    Birth control/protection: Surgical  Lifestyle  . Physical activity:    Days per week: Not on file    Minutes per session: Not on file  . Stress: Not on file  Relationships  . Social connections:    Talks on phone: Not on file    Gets together: Not on file    Attends religious service: Not on file    Active member of club or organization: Not on file    Attends meetings of clubs or organizations: Not on file    Relationship status: Not on file  . Intimate partner violence:    Fear of current or ex partner: Not on file    Emotionally abused: Not on file    Physically abused: Not on file    Forced sexual activity: Not on file  Other Topics Concern  . Not on file  Social History Narrative   Patient is right handed, resides in home with husband and daughter. She consumes 16 oz of tea daily.   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    Outpatient Medications Prior to Visit  Medication Sig Dispense Refill  . albuterol (PROVENTIL HFA;VENTOLIN HFA) 108 (90 Base) MCG/ACT inhaler Inhale 1-2 puffs into the lungs every 6 (six) hours as needed for wheezing or shortness of breath. 8 g 0  . ALPRAZolam (XANAX) 0.5 MG tablet Take 0.5 mg by mouth 2 (two) times daily as needed for anxiety.     [place] dexlansoprazole (DEXILANT) 60 MG capsule     . furosemide (LASIX) 20 MG tablet 1 TABLET BY MOUTH DAILY AS NEEDED 30 tablet 3  . lisinopril (PRINIVIL,ZESTRIL) 40 MG tablet 1 tablet by mouth now and then 1 tablet daily 90 tablet 3  . Vitamin D, Ergocalciferol, (DRISDOL) 50000 units CAPS capsule Take 1 capsule (50,000 Units total) by mouth every 7 (seven) days.  12 capsule 0   No facility-administered medications prior to visit.     Allergies  Allergen Reactions  . Amoxicillin-Pot Clavulanate Rash  . Levaquin [Levofloxacin In D5w] Other (See Comments)    tendonopathy  . Clindamycin Itching  . Clindamycin/Lincomycin Itching  . Lactose Intolerance (Gi) Nausea Only  . Metoprolol     Weight gain/fluid retention   . Penicillin G Other (See Comments)  . Aspirin Other (See Comments)    Migraine headache  . Latex Rash  . Penicillins Swelling and Rash    Review of Systems  Constitutional: Negative for fever and malaise/fatigue.  HENT: Negative for congestion.   Eyes: Negative for blurred vision.  Respiratory: Negative for shortness of breath.   Cardiovascular: Negative for chest pain, palpitations and leg swelling.  Gastrointestinal: Negative for abdominal pain, blood in stool and nausea.  Genitourinary: Negative for dysuria and frequency.  Musculoskeletal: Positive for joint pain and neck pain. Negative for falls.  Skin: Negative for rash.  Neurological: Negative for dizziness, loss of consciousness and headaches.  Endo/Heme/Allergies: Negative for environmental allergies.  Psychiatric/Behavioral: Negative for depression. The patient is not nervous/anxious.        Objective:    Physical Exam  Constitutional: She is oriented to person, place, and time. She appears well-developed and well-nourished. No distress.  HENT:  Head: Normocephalic and atraumatic.  Nose: Nose normal.  Eyes: Right eye exhibits no discharge. Left eye exhibits no discharge.  Neck: Normal range of motion. Neck supple.  Cardiovascular: Normal rate and regular rhythm.  No murmur heard. Pulmonary/Chest: Effort normal and breath sounds normal.  Abdominal: Soft. Bowel sounds are normal. There is no tenderness.  Musculoskeletal: She exhibits no edema.  Neurological: She is alert and oriented to person, place, and time.  Skin: Skin is warm and dry.  Psychiatric: She  has a normal mood and affect.  Nursing note and vitals reviewed.   BP 126/80 (BP Location: Left Arm, Patient Position: Sitting, Cuff Size: Normal)   Pulse 64   Temp 98.4 F (36.9 C) (Oral)   Resp 18   Wt 194 lb 6.4 oz (88.2 kg)   LMP 05/10/2014 (Approximate)   SpO2 98%   BMI 30.45 kg/m  Wt Readings from Last 3 Encounters:  05/03/18 194 lb 6.4 oz (88.2 kg)  04/20/18 194 lb (88 kg)  03/25/18 192 lb (87.1 kg)     Lab Results  Component Value Date   WBC 7.0 07/02/2017   HGB 13.1 07/02/2017  HCT 38.2 07/02/2017   PLT 208 07/02/2017   GLUCOSE 96 07/02/2017   CHOL 199 06/18/2016   TRIG 194.0 (H) 06/18/2016   HDL 61.00 06/18/2016   LDLCALC 99 06/18/2016   ALT 12 06/18/2016   AST 17 06/18/2016   NA 139 07/02/2017   K 4.0 07/02/2017   CL 103 07/02/2017   CREATININE 0.67 07/02/2017   BUN 10 07/02/2017   CO2 22 07/02/2017   TSH 1.510 07/02/2017   HGBA1C 5.3 06/18/2016    Lab Results  Component Value Date   TSH 1.510 07/02/2017   Lab Results  Component Value Date   WBC 7.0 07/02/2017   HGB 13.1 07/02/2017   HCT 38.2 07/02/2017   MCV 85 07/02/2017   PLT 208 07/02/2017   Lab Results  Component Value Date   NA 139 07/02/2017   K 4.0 07/02/2017   CO2 22 07/02/2017   GLUCOSE 96 07/02/2017   BUN 10 07/02/2017   CREATININE 0.67 07/02/2017   BILITOT 0.8 06/18/2016   ALKPHOS 74 06/18/2016   AST 17 06/18/2016   ALT 12 06/18/2016   PROT 6.7 06/18/2016   ALBUMIN 4.4 06/18/2016   CALCIUM 9.5 07/02/2017   ANIONGAP 8 01/04/2016   GFR 78.17 06/18/2016   Lab Results  Component Value Date   CHOL 199 06/18/2016   Lab Results  Component Value Date   HDL 61.00 06/18/2016   Lab Results  Component Value Date   LDLCALC 99 06/18/2016   Lab Results  Component Value Date   TRIG 194.0 (H) 06/18/2016   Lab Results  Component Value Date   CHOLHDL 3 06/18/2016   Lab Results  Component Value Date   HGBA1C 5.3 06/18/2016       Assessment & Plan:   Problem List  Items Addressed This Visit    Essential hypertension, benign    Well controlled, no changes to meds. Encouraged heart healthy diet such as the DASH diet and exercise as tolerated.       Anxiety    Has been under a great deal of stress concerning her neck pain and trying to figure out how to move forward with imaging and workman's comp. She was hit in the back of the neck while at work and this likely has affected her pain so she is struggling with pushing workman's comp to do the right thing vs moving forward without them. She will let us know if she is ready for further imaging. Encouraged moist heat and gentle stretching as tolerated. 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 She will let us know if she is ready to proceed with imaging but fo now she is trying to work with [employer] comp. Encouraged moist heat and gentle stretching as tolerated. May try NSAIDs and prescription meds as directed and report if symptoms worsen or seek immediate care         I am having [place] start on tiZANidine. I am also having her maintain her ALPRAZolam, albuterol, lisinopril, furosemide, dexlansoprazole, and Vitamin D (Ergocalciferol).  Meds ordered this encounter  Medications  . tiZANidine (ZANAFLEX) 4 MG tablet    Sig: Take 1 tablet (4 mg total) by mouth at bedtime as needed for muscle spasms.    Dispense:  30 tablet  Refill:  0     Penni Homans, MD

## 2018-05-19 ENCOUNTER — Ambulatory Visit: Payer: BC Managed Care – PPO | Admitting: Family Medicine

## 2018-06-06 ENCOUNTER — Encounter: Payer: Self-pay | Admitting: Family Medicine

## 2018-06-06 ENCOUNTER — Ambulatory Visit (INDEPENDENT_AMBULATORY_CARE_PROVIDER_SITE_OTHER): Payer: BC Managed Care – PPO | Admitting: Family Medicine

## 2018-06-06 VITALS — BP 150/90 | HR 60 | Temp 98.7°F | Resp 16 | Ht 67.0 in | Wt 193.0 lb

## 2018-06-06 DIAGNOSIS — S14109D Unspecified injury at unspecified level of cervical spinal cord, subsequent encounter: Secondary | ICD-10-CM

## 2018-06-06 DIAGNOSIS — M899 Disorder of bone, unspecified: Secondary | ICD-10-CM | POA: Diagnosis not present

## 2018-06-06 DIAGNOSIS — I10 Essential (primary) hypertension: Secondary | ICD-10-CM

## 2018-06-06 DIAGNOSIS — R739 Hyperglycemia, unspecified: Secondary | ICD-10-CM | POA: Diagnosis not present

## 2018-06-06 DIAGNOSIS — M542 Cervicalgia: Secondary | ICD-10-CM | POA: Diagnosis not present

## 2018-06-06 DIAGNOSIS — E782 Mixed hyperlipidemia: Secondary | ICD-10-CM

## 2018-06-06 NOTE — Patient Instructions (Addendum)
BP 100-140/60-90 is normal  Vitamin D 2000 IU daily Hypertension Hypertension, commonly called high blood pressure, is when the force of blood pumping through the arteries is too strong. The arteries are the blood vessels that carry blood from the heart throughout the body. Hypertension forces the heart to work harder to pump blood and may cause arteries to become narrow or stiff. Having untreated or uncontrolled hypertension can cause heart attacks, strokes, kidney disease, and other problems. A blood pressure reading consists of a higher number over a lower number. Ideally, your blood pressure should be below 120/80. The first ("top") number is called the systolic pressure. It is a measure of the pressure in your arteries as your heart beats. The second ("bottom") number is called the diastolic pressure. It is a measure of the pressure in your arteries as the heart relaxes. What are the causes? The cause of this condition is not known. What increases the risk? Some risk factors for high blood pressure are under your control. Others are not. Factors you can change  Smoking.  Having type 2 diabetes mellitus, high cholesterol, or both.  Not getting enough exercise or physical activity.  Being overweight.  Having too much fat, sugar, calories, or salt (sodium) in your diet.  Drinking too much alcohol. Factors that are difficult or impossible to change  Having chronic kidney disease.  Having a family history of high blood pressure.  Age. Risk increases with age.  Race. You may be at higher risk if you are African-American.  Gender. Men are at higher risk than women before age 49. After age 4, women are at higher risk than men.  Having obstructive sleep apnea.  Stress. What are the signs or symptoms? Extremely high blood pressure (hypertensive crisis) may cause:  Headache.  Anxiety.  Shortness of breath.  Nosebleed.  Nausea and vomiting.  Severe chest pain.  Jerky  movements you cannot control (seizures).  How is this diagnosed? This condition is diagnosed by measuring your blood pressure while you are seated, with your arm resting on a surface. The cuff of the blood pressure monitor will be placed directly against the skin of your upper arm at the level of your heart. It should be measured at least twice using the same arm. Certain conditions can cause a difference in blood pressure between your right and left arms. Certain factors can cause blood pressure readings to be lower or higher than normal (elevated) for a short period of time:  When your blood pressure is higher when you are in a health care provider's office than when you are at home, this is called white coat hypertension. Most people with this condition do not need medicines.  When your blood pressure is higher at home than when you are in a health care provider's office, this is called masked hypertension. Most people with this condition may need medicines to control blood pressure.  If you have a high blood pressure reading during one visit or you have normal blood pressure with other risk factors:  You may be asked to return on a different day to have your blood pressure checked again.  You may be asked to monitor your blood pressure at home for 1 week or longer.  If you are diagnosed with hypertension, you may have other blood or imaging tests to help your health care provider understand your overall risk for other conditions. How is this treated? This condition is treated by making healthy lifestyle changes, such as eating  healthy foods, exercising more, and reducing your alcohol intake. Your health care provider may prescribe medicine if lifestyle changes are not enough to get your blood pressure under control, and if:  Your systolic blood pressure is above 130.  Your diastolic blood pressure is above 80.  Your personal target blood pressure may vary depending on your medical  conditions, your age, and other factors. Follow these instructions at home: Eating and drinking  Eat a diet that is high in fiber and potassium, and low in sodium, added sugar, and fat. An example eating plan is called the DASH (Dietary Approaches to Stop Hypertension) diet. To eat this way: ? Eat plenty of fresh fruits and vegetables. Try to fill half of your plate at each meal with fruits and vegetables. ? Eat whole grains, such as whole wheat pasta, brown rice, or whole grain bread. Fill about one quarter of your plate with whole grains. ? Eat or drink low-fat dairy products, such as skim milk or low-fat yogurt. ? Avoid fatty cuts of meat, processed or cured meats, and poultry with skin. Fill about one quarter of your plate with lean proteins, such as fish, chicken without skin, beans, eggs, and tofu. ? Avoid premade and processed foods. These tend to be higher in sodium, added sugar, and fat.  Reduce your daily sodium intake. Most people with hypertension should eat less than 1,500 mg of sodium a day.  Limit alcohol intake to no more than 1 drink a day for nonpregnant women and 2 drinks a day for men. One drink equals 12 oz of beer, 5 oz of wine, or 1 oz of hard liquor. Lifestyle  Work with your health care provider to maintain a healthy body weight or to lose weight. Ask what an ideal weight is for you.  Get at least 30 minutes of exercise that causes your heart to beat faster (aerobic exercise) most days of the week. Activities may include walking, swimming, or biking.  Include exercise to strengthen your muscles (resistance exercise), such as pilates or lifting weights, as part of your weekly exercise routine. Try to do these types of exercises for 30 minutes at least 3 days a week.  Do not use any products that contain nicotine or tobacco, such as cigarettes and e-cigarettes. If you need help quitting, ask your health care provider.  Monitor your blood pressure at home as told by  your health care provider.  Keep all follow-up visits as told by your health care provider. This is important. Medicines  Take over-the-counter and prescription medicines only as told by your health care provider. Follow directions carefully. Blood pressure medicines must be taken as prescribed.  Do not skip doses of blood pressure medicine. Doing this puts you at risk for problems and can make the medicine less effective.  Ask your health care provider about side effects or reactions to medicines that you should watch for. Contact a health care provider if:  You think you are having a reaction to a medicine you are taking.  You have headaches that keep coming back (recurring).  You feel dizzy.  You have swelling in your ankles.  You have trouble with your vision. Get help right away if:  You develop a severe headache or confusion.  You have unusual weakness or numbness.  You feel faint.  You have severe pain in your chest or abdomen.  You vomit repeatedly.  You have trouble breathing. Summary  Hypertension is when the force of blood pumping through  your arteries is too strong. If this condition is not controlled, it may put you at risk for serious complications.  Your personal target blood pressure may vary depending on your medical conditions, your age, and other factors. For most people, a normal blood pressure is less than 120/80.  Hypertension is treated with lifestyle changes, medicines, or a combination of both. Lifestyle changes include weight loss, eating a healthy, low-sodium diet, exercising more, and limiting alcohol. This information is not intended to replace advice given to you by your health care provider. Make sure you discuss any questions you have with your health care provider. Document Released: 07/20/2005 Document Revised: 06/17/2016 Document Reviewed: 06/17/2016 Elsevier Interactive Patient Education  [employer].

## 2018-06-07 NOTE — Assessment & Plan Note (Signed)
Xray showed  A lucency of unclear significance. After struggling to get her workman's comp to cover her work up she has decided she does not have the time or energy to continue to fight and she is now interested in proceeding with further imaging since her neck pain persists. MR ordered.

## 2018-06-07 NOTE — Assessment & Plan Note (Signed)
hgba1c acceptable, minimize simple carbs. Increase exercise as tolerated.  

## 2018-06-07 NOTE — Assessment & Plan Note (Signed)
Encouraged heart healthy diet, increase exercise, avoid trans fats, consider a krill oil cap daily 

## 2018-06-07 NOTE — Assessment & Plan Note (Signed)
Well controlled, no changes to meds. Encouraged heart healthy diet such as the DASH diet and exercise as tolerated.  °

## 2018-06-07 NOTE — Progress Notes (Signed)
Subjective:    Patient ID: Megan May, female    DOB: 14-Oct-1960, 57 y.o.   MRN: [id number]  Chief Complaint  Patient presents with  . Hypertension    Here for follow up  . Anxiety    HPI Patient is in today for follow up and unfortunately she continues to have neck pain. Xray showed  A lucency of unclear significance. After struggling to get her workman's comp to cover her work up she has decided she does not have the time or energy to continue to fight and she is now interested in proceeding with further imaging since her neck pain persists. The radicular symptoms down the left arm has improved and no new neurologic complaints. 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 Endorses anxiety regarding neck lesion and fatigue. She is unable to exercise the way she should due to he concerns. Denies CP/palp/SOB/HA/congestion/fevers/GI or GU c/o. Taking meds as prescribed  Past Medical History:  Diagnosis Date  . Anxiety   . Asthma   . Depression    history of   . Eosinophilic esophagitis   . Fatigue 07/02/2017  . Generalized anxiety disorder   . Hyperglycemia 03/15/2016  . Hyperlipidemia, mixed 06/30/2016  . Hypersomnia, recurrent   . Hypertension   . Migraine   . Migraines   . Posttraumatic stress disorder     Past Surgical History:  Procedure Laterality Date  . CESAREAN SECTION  2004  . FRACTURE SURGERY  1973   R arm  . LAPAROTOMY Bilateral 06/14/2014   Procedure: LAPAROTOMY with right  salpingo-oophorectomy and left salpingectomy;  Surgeon: Linda Hedges, DO;  Location: [facility];  Service: Gynecology;  Laterality: Bilateral;  . TONSILLECTOMY AND ADENOIDECTOMY  1969    Family History  Problem Relation Age of Onset  . Hypertension Father   . Heart disease Father   . Diabetes Father   . CAD Father   . Stroke Maternal Grandfather   . Stroke Maternal Grandmother   . Diabetes Paternal Grandmother   . Other Cousin          bicuspid aortic valve  . Diabetes Brother     Social History   Socioeconomic History  . Marital status: Married    Spouse name: Randall Hiss  . Number of children: 1  . Years of education: 2 Masters  . Highest education level: Not on file  Occupational History  . Occupation: SPECIAL EDUCATOR    Employer: [employer]    Comment: Teacher, early years/pre  Social Needs  . Financial resource strain: Not on file  . Food insecurity:    Worry: Not on file    Inability: Not on file  . Transportation needs:    Medical: Not on file    Non-medical: Not on file  Tobacco Use  . Smoking status: Never Smoker  . Smokeless tobacco: Never Used  Substance and Sexual Activity  . Alcohol use: No  . Drug use: No  . Sexual activity: Yes    Birth control/protection: Surgical  Lifestyle  . Physical activity:    Days per week: Not on file    Minutes per session: Not on file  . Stress: Not on file  Relationships  . Social connections:    Talks on phone: Not on file    Gets together: Not on file    Attends religious service: Not on file    Active member of club or organization: Not on  file    Attends meetings of clubs or organizations: Not on file    Relationship status: Not on file  . Intimate partner violence:    Fear of current or ex partner: Not on file    Emotionally abused: Not on file    Physically abused: Not on file    Forced sexual activity: Not on file  Other Topics Concern  . Not on file  Social History Narrative   Patient is right handed, resides in home with husband and daughter. She consumes 16 oz of tea daily.   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    Outpatient Medications Prior to Visit  Medication Sig Dispense Refill  . albuterol (PROVENTIL HFA;VENTOLIN HFA) 108 (90 Base) MCG/ACT inhaler Inhale 1-2 puffs into the lungs every 6 (six) hours as needed for wheezing or shortness of breath. 8 g 0  . ALPRAZolam (XANAX) 0.5 MG tablet Take 0.5 mg by mouth 2  (two) times daily as needed for anxiety.     Marland Kitchen dexlansoprazole (DEXILANT) 60 MG capsule     . furosemide (LASIX) 20 MG tablet 1 TABLET BY MOUTH DAILY AS NEEDED 30 tablet 3  . lisinopril (PRINIVIL,ZESTRIL) 40 MG tablet 1 tablet by mouth now and then 1 tablet daily 90 tablet 3  . Vitamin D, Ergocalciferol, (DRISDOL) 50000 units CAPS capsule Take 1 capsule (50,000 Units total) by mouth every 7 (seven) days. 12 capsule 0  . tiZANidine (ZANAFLEX) 4 MG tablet Take 1 tablet (4 mg total) by mouth at bedtime as needed for muscle spasms. (Patient not taking: Reported on 06/06/2018) 30 tablet 0   No facility-administered medications prior to visit.     Allergies  Allergen Reactions  . Amoxicillin-Pot Clavulanate Rash  . Levaquin [Levofloxacin In D5w] Other (See Comments)    tendonopathy  . Clindamycin Itching  . Clindamycin/Lincomycin Itching  . Lactose Intolerance (Gi) Nausea Only  . Metoprolol     Weight gain/fluid retention   . Penicillin G Other (See Comments)  . Aspirin Other (See Comments)    Migraine headache  . Latex Rash  . Penicillins Swelling and Rash    Review of Systems  Constitutional: Negative for fever and malaise/fatigue.  HENT: Negative for congestion.   Eyes: Negative for blurred vision.  Respiratory: Negative for shortness of breath.   Cardiovascular: Negative for chest pain, palpitations and leg swelling.  Gastrointestinal: Negative for abdominal pain, blood in stool and nausea.  Genitourinary: Negative for dysuria and frequency.  Musculoskeletal: Positive for neck pain. Negative for falls.  Skin: Negative for rash.  Neurological: Negative for dizziness, loss of consciousness and headaches.  Endo/Heme/Allergies: Negative for environmental allergies.  Psychiatric/Behavioral: Negative for depression. The patient is nervous/anxious.        Objective:    Physical Exam  Constitutional: She is oriented to person, place, and time. She appears well-developed and  well-nourished. No distress.  HENT:  Head: Normocephalic and atraumatic.  Nose: Nose normal.  Eyes: Right eye exhibits no discharge. Left eye exhibits no discharge.  Neck: Normal range of motion. Neck supple.  Cardiovascular: Normal rate and regular rhythm.  No murmur heard. Pulmonary/Chest: Effort normal and breath sounds normal.  Abdominal: Soft. Bowel sounds are normal. There is no tenderness.  Musculoskeletal: She exhibits no edema.  Pain with palpation over left SCM muscle  Neurological: She is alert and oriented to person, place, and time.  Skin: Skin is warm and dry.  Psychiatric: She has a normal mood and affect.  Nursing note and vitals reviewed.   BP (!) 150/90 (BP Location: Left Arm, Patient Position: Sitting, Cuff Size: Large)   Pulse 60   Temp 98.7 F (37.1 C) (Oral)   Resp 16   Ht 5\' 7"  (1.702 m)   Wt 193 lb (87.5 kg)   LMP 05/10/2014 (Approximate)   SpO2 98%   BMI 30.23 kg/m  Wt Readings from Last 3 Encounters:  06/06/18 193 lb (87.5 kg)  05/03/18 194 lb 6.4 oz (88.2 kg)  04/20/18 194 lb (88 kg)     Lab Results  Component Value Date   WBC 7.0 07/02/2017   HGB 13.1 07/02/2017   HCT 38.2 07/02/2017   PLT 208 07/02/2017   GLUCOSE 96 07/02/2017   CHOL 199 06/18/2016   TRIG 194.0 (H) 06/18/2016   HDL 61.00 06/18/2016   LDLCALC 99 06/18/2016   ALT 12 06/18/2016   AST 17 06/18/2016   NA 139 07/02/2017   K 4.0 07/02/2017   CL 103 07/02/2017   CREATININE 0.67 07/02/2017   BUN 10 07/02/2017   CO2 22 07/02/2017   TSH 1.510 07/02/2017   HGBA1C 5.3 06/18/2016    Lab Results  Component Value Date   TSH 1.510 07/02/2017   Lab Results  Component Value Date   WBC 7.0 07/02/2017   HGB 13.1 07/02/2017   HCT 38.2 07/02/2017   MCV 85 07/02/2017   PLT 208 07/02/2017   Lab Results  Component Value Date   NA 139 07/02/2017   K 4.0 07/02/2017   CO2 22 07/02/2017   GLUCOSE 96 07/02/2017   BUN 10 07/02/2017   CREATININE 0.67 07/02/2017   BILITOT 0.8  06/18/2016   ALKPHOS 74 06/18/2016   AST 17 06/18/2016   ALT 12 06/18/2016   PROT 6.7 06/18/2016   ALBUMIN 4.4 06/18/2016   CALCIUM 9.5 07/02/2017   ANIONGAP 8 01/04/2016   GFR 78.17 06/18/2016   Lab Results  Component Value Date   CHOL 199 06/18/2016   Lab Results  Component Value Date   HDL 61.00 06/18/2016   Lab Results  Component Value Date   LDLCALC 99 06/18/2016   Lab Results  Component Value Date   TRIG 194.0 (H) 06/18/2016   Lab Results  Component Value Date   CHOLHDL 3 06/18/2016   Lab Results  Component Value Date   HGBA1C 5.3 06/18/2016       Assessment & Plan:   Problem List Items Addressed This Visit    Essential hypertension, benign    Well controlled, no changes to meds. Encouraged heart healthy diet such as the DASH diet and exercise as tolerated.       Hyperglycemia    hgba1c acceptable, minimize simple carbs. Increase exercise as tolerated.       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 After struggling to get her workman's comp to cover her work up she has decided she does not have the time or energy to continue to fight and she is now interested in proceeding with further imaging since her neck pain persists. MR ordered.      Relevant Orders   MR Cervical Spine Wo Contrast    Other Visit Diagnoses    Neck pain       Relevant Orders   MR  Cervical Spine Wo Contrast   Bone lesion       Relevant Orders   MR Cervical Spine Wo Contrast      I am having Megan May maintain her ALPRAZolam, albuterol, lisinopril, furosemide, dexlansoprazole, Vitamin D (Ergocalciferol), and tiZANidine.  No orders of the defined types were placed in this encounter.    Penni Homans, MD

## 2018-06-25 ENCOUNTER — Other Ambulatory Visit: Payer: Self-pay

## 2018-06-25 ENCOUNTER — Emergency Department (HOSPITAL_COMMUNITY)
Admission: EM | Admit: 2018-06-25 | Discharge: 2018-06-26 | Disposition: A | Discharge status: Home / Self Care | Payer: BC Managed Care – PPO | Attending: Emergency Medicine | Admitting: Emergency Medicine

## 2018-06-25 ENCOUNTER — Emergency Department (HOSPITAL_COMMUNITY): Payer: BC Managed Care – PPO

## 2018-06-25 DIAGNOSIS — R42 Dizziness and giddiness: Secondary | ICD-10-CM | POA: Diagnosis not present

## 2018-06-25 DIAGNOSIS — Z79899 Other long term (current) drug therapy: Secondary | ICD-10-CM | POA: Diagnosis not present

## 2018-06-25 DIAGNOSIS — R110 Nausea: Secondary | ICD-10-CM | POA: Diagnosis not present

## 2018-06-25 DIAGNOSIS — I10 Essential (primary) hypertension: Secondary | ICD-10-CM

## 2018-06-25 DIAGNOSIS — Z91040 Latex allergy status: Secondary | ICD-10-CM | POA: Insufficient documentation

## 2018-06-25 DIAGNOSIS — R0789 Other chest pain: Secondary | ICD-10-CM | POA: Diagnosis present

## 2018-06-25 LAB — I-STAT BETA HCG BLOOD, ED (NOT ORDERABLE): I-stat hCG, quantitative: <5 m[IU]/mL (ref <5)

## 2018-06-25 LAB — POCT I-STAT TROPONIN I: Troponin i, poc: 0 ng/mL (ref 0.00–0.08)

## 2018-06-25 LAB — BASIC METABOLIC PANEL
Anion gap: 7 (ref 5–15)
BUN: 12 mg/dL (ref 6–20)
CO2: 27 mmol/L (ref 22–32)
Calcium: 9.9 mg/dL (ref 8.9–10.3)
Chloride: 109 mmol/L (ref 98–111)
Creatinine, Ser: 0.81 mg/dL (ref 0.44–1.00)
GFR calc Af Amer: >60 mL/min (ref >60)
GFR calc non Af Amer: >60 mL/min (ref >60)
GLUCOSE: 131 mg/dL — AB (ref 70–99)
POTASSIUM: 3.7 mmol/L (ref 3.5–5.1)
Sodium: 143 mmol/L (ref 135–145)

## 2018-06-25 LAB — CBC
HCT: 42 % (ref 36.0–46.0)
HEMOGLOBIN: 13.9 g/dL (ref 12.0–15.0)
MCH: 28.9 pg (ref 26.0–34.0)
MCHC: 33.1 g/dL (ref 30.0–36.0)
MCV: 87.3 fL (ref 80.0–100.0)
Platelets: 203 10*3/uL (ref 150–400)
RBC: 4.81 MIL/uL (ref 3.87–5.11)
RDW: 12.6 % (ref 11.5–15.5)
WBC: 5.9 10*3/uL (ref 4.0–10.5)
nRBC: 0 % (ref 0.0–0.2)

## 2018-06-25 MED ORDER — ONDANSETRON 4 MG PO TBDP
4.0000 mg | ORAL_TABLET | Freq: Once | ORAL | Status: AC
  Administered 2018-06-26: 4 mg via ORAL
  Filled 2018-06-25: qty 1

## 2018-06-25 MED ORDER — MECLIZINE HCL 25 MG PO TABS
25.0000 mg | ORAL_TABLET | Freq: Once | ORAL | Status: AC
  Administered 2018-06-26: 25 mg via ORAL
  Filled 2018-06-25: qty 1

## 2018-06-25 NOTE — ED Triage Notes (Signed)
Pt to ed with c/o of chest tightness, dizziness, and hypertension. Pt states she went to lay down tonight and felt dizzy around 2000. Pt then checked her BP and it was 200/108. Pt states her chest tightness started after the dizziness at 2100. Pt has hx of HTN and takes Lisinopril, and esophagitis. Pt is A&O and ambulatory. Pt state pain is mild 3/10.

## 2018-06-25 NOTE — ED Provider Notes (Signed)
[facility] DEPT Provider Note   CSN: [id number] Arrival date & time: 06/25/18  2119     History   Chief Complaint Chief Complaint  Patient presents with  . Chest Pain  . Hypertension    HPI Megan May is a 57 y.o. female.  HPI  This is a 57 year old female with a history of hypertension, hyperlipidemia, migraines who presents with hypertension, dizziness, chest pain.  Patient reports that earlier this evening she began to have room spinning dizziness.  She took her blood pressure" it was very high.  She took lisinopril and a Dramamine and laid down.  She states that reach she rechecked her blood pressure and it was 200/108.  She continued to have some dizziness.  She then developed bilateral arm pain with some chest tightness.  The pain did not radiate.  It lasted for approximately 1 hour.  She is currently pain-free.  She had some associated nausea.  Currently she denies any chest pain or dizziness but does report some nausea.  She denies any strokelike symptoms including weakness, numbness, speech difficulty, facial droop.  She reports recent upper respiratory illness and sinus infection.  No history of vertigo.  Patient is followed by cardiology, Dr. Oval Linsey.  2017 exercise treadmill was negative for ischemia.  She had a normal echocardiogram at that time as well.  Past Medical History:  Diagnosis Date  . Anxiety   . Asthma   . Depression    history of   . Eosinophilic esophagitis   . Fatigue 07/02/2017  . Generalized anxiety disorder   . Hyperglycemia 03/15/2016  . Hyperlipidemia, mixed 06/30/2016  . Hypersomnia, recurrent   . Hypertension   . Migraine   . Migraines   . Posttraumatic stress disorder     Patient Active Problem List   Diagnosis Date Noted  . Patellofemoral arthritis of right knee 03/25/2018  . Chronic pain of right knee 03/16/2018  . Injury of cervical spine ([place]) 03/16/2018  . Fatigue 07/02/2017  .  Hyperlipidemia, mixed 06/30/2016  . Hyperglycemia 03/15/2016  . Atypical chest pain 01/17/2016  . RUQ pain 01/17/2016  . Right hip pain 03/24/2015  . Leukocytopenia 03/24/2015  . Bursitis of left shoulder 03/22/2015  . Greater trochanteric bursitis of right hip 03/22/2015  . S/P laparotomy 06/14/2014  . Diverticulosis of colon without hemorrhage  colonoscopy 2015 05/27/2014  . Colon polyp 05/02/2014  . Microscopic hematuria work up urology  see 01/2014 note 04/22/2014  . Hypersomnia, recurrent   . Dysarthria 05/01/2013  . Mastodynia 10/13/2012  . Mass of right breast 10/13/2012  . Essential hypertension, benign 06/29/2011  . Migraines 06/29/2011  . Anxiety 06/29/2011  . Eosinophilic esophagitis 60/73/7106  . Lupus anticoagulant positive 06/29/2011    Past Surgical History:  Procedure Laterality Date  . CESAREAN SECTION  2004  . FRACTURE SURGERY  1973   R arm  . LAPAROTOMY Bilateral 06/14/2014   Procedure: LAPAROTOMY with right  salpingo-oophorectomy and left salpingectomy;  Surgeon: Linda Hedges, DO;  Location: [facility];  Service: Gynecology;  Laterality: Bilateral;  . TONSILLECTOMY AND ADENOIDECTOMY  1969     OB History    Gravida  2   Para  1   Term      Preterm      AB  1   Living        SAB  1   TAB      Ectopic      Multiple  Live Births               Home Medications    Prior to Admission medications   Medication Sig Start Date End Date Taking? Authorizing Provider  albuterol (PROVENTIL HFA;VENTOLIN HFA) 108 (90 Base) MCG/ACT inhaler Inhale 1-2 puffs into the lungs every 6 (six) hours as needed for wheezing or shortness of breath. 04/23/16   Skeet Latch, MD  ALPRAZolam Duanne Moron) 0.5 MG tablet Take 0.5 mg by mouth 2 (two) times daily as needed for anxiety.     [provider]  dexlansoprazole (DEXILANT) 60 MG capsule  03/16/15   [provider]  furosemide (LASIX) 20 MG tablet 1 TABLET BY MOUTH DAILY AS NEEDED 11/25/17    Skeet Latch, MD  lisinopril (PRINIVIL,ZESTRIL) 40 MG tablet 1 tablet by mouth now and then 1 tablet daily 07/14/17   Skeet Latch, MD  meclizine (ANTIVERT) 25 MG tablet Take 1 tablet (25 mg total) by mouth 3 (three) times daily as needed for dizziness. 06/26/18   Horton, Barbette Hair, MD  tiZANidine (ZANAFLEX) 4 MG tablet Take 1 tablet (4 mg total) by mouth at bedtime as needed for muscle spasms. Patient not taking: Reported on 06/06/2018 05/03/18   Mosie Lukes, MD  Vitamin D, Ergocalciferol, (DRISDOL) 50000 units CAPS capsule Take 1 capsule (50,000 Units total) by mouth every 7 (seven) days. 03/25/18   Lyndal Pulley, DO  DULoxetine (CYMBALTA) 30 MG capsule Take 60 mg by mouth daily.   10/21/11  [provider]  topiramate (TOPAMAX) 25 MG capsule Take 75 mg by mouth daily.   10/21/11  [provider]    Family History Family History  Problem Relation Age of Onset  . Hypertension Father   . Heart disease Father   . Diabetes Father   . CAD Father   . Stroke Maternal Grandfather   . Stroke Maternal Grandmother   . Diabetes Paternal Grandmother   . Other Cousin        bicuspid aortic valve  . Diabetes Brother     Social History Social History   Tobacco Use  . Smoking status: Never Smoker  . Smokeless tobacco: Never Used  Substance Use Topics  . Alcohol use: No  . Drug use: No     Allergies   Amoxicillin-pot clavulanate; Levaquin [levofloxacin in d5w]; Clindamycin; Clindamycin/lincomycin; Lactose intolerance (gi); Metoprolol; Penicillin g; Aspirin; Latex; and Penicillins   Review of Systems Review of Systems  Constitutional: Negative for fever.  Respiratory: Negative for shortness of breath.   Cardiovascular: Positive for chest pain. Negative for leg swelling.  Gastrointestinal: Positive for nausea. Negative for abdominal pain and vomiting.  Genitourinary: Negative for dysuria.  Neurological: Positive for dizziness. Negative for speech  difficulty, weakness, numbness and headaches.  All other systems reviewed and are negative.    Physical Exam Updated Vital Signs BP 138/71   Pulse (!) 53   Temp 98.3 F (36.8 C) (Oral)   Resp 16   Ht 1.702 m (5\' 7" )   Wt 83 kg   LMP 05/10/2014 (Approximate)   SpO2 98%   BMI 28.66 kg/m   Physical Exam  Constitutional: She is oriented to person, place, and time. She appears well-developed and well-nourished.  HENT:  Head: Normocephalic and atraumatic.  Eyes: Pupils are equal, round, and reactive to light.  Horizontal nystagmus with leftward gaze  Neck: Neck supple.  Cardiovascular: Normal rate, regular rhythm, normal heart sounds and normal pulses.  Pulmonary/Chest: Effort normal. No respiratory distress.  She has no wheezes.  Abdominal: Soft. Bowel sounds are normal.  Musculoskeletal:       Right lower leg: She exhibits no edema.       Left lower leg: She exhibits no edema.  Neurological: She is alert and oriented to person, place, and time.  Cranial nerves II through XII intact, 5 out of 5 strength in all 4 extremities, no dysmetria to finger-nose-finger  Skin: Skin is warm and dry.  Psychiatric: She has a normal mood and affect.  Nursing note and vitals reviewed.    ED Treatments / Results  Labs (all labs ordered are listed, but only abnormal results are displayed) Labs Reviewed  BASIC METABOLIC PANEL - Abnormal; Notable for the following components:      Result Value   Glucose, Bld 131 (*)    All other components within normal limits  CBC  I-STAT TROPONIN, ED  I-STAT BETA HCG BLOOD, ED ([facility], [facility], [facility] ONLY)  I-STAT BETA HCG BLOOD, ED (NOT ORDERABLE)  POCT I-STAT TROPONIN I  I-STAT TROPONIN, ED  POCT I-STAT TROPONIN I    EKG  ED ECG REPORT   Date: 06/25/2018  Rate: 58  Rhythm: normal sinus rhythm  QRS Axis: normal  Intervals: normal  ST/T Wave abnormalities: normal  Conduction Disutrbances:none  Narrative Interpretation:   Old EKG Reviewed: none  available  I have personally reviewed the EKG tracing and agree with the computerized printout as noted.   Radiology Dg Chest 2 View  Result Date: 06/25/2018 CLINICAL DATA:  Chest tightness, dizziness and hypertension. EXAM: CHEST - 2 VIEW COMPARISON:  Chest x-ray dated 01/04/2016. FINDINGS: Heart size and mediastinal contours are within normal limits, stable. Lungs are clear. No pleural effusion or pneumothorax seen. Osseous structures about the chest are unremarkable. IMPRESSION: No active cardiopulmonary disease. No evidence of pneumonia or pulmonary edema. Electronically Signed   By: Franki Cabot M.D.   On: 06/25/2018 22:41    Procedures Procedures (including critical care time)  Medications Ordered in ED Medications  ondansetron (ZOFRAN-ODT) disintegrating tablet 4 mg (4 mg Oral Given 06/26/18 0026)  meclizine (ANTIVERT) tablet 25 mg (25 mg Oral Given 06/26/18 0026)     Initial Impression / Assessment and Plan / ED Course  I have reviewed the triage vital signs and the nursing notes.  Pertinent labs & imaging results that were available during my care of the patient were reviewed by me and considered in my medical decision making (see chart for details).     Patient presents with episodes of chest pain, dizziness, hypertension.  She is overall nontoxic-appearing and vital signs reviewed notable for initial blood pressure of 219/93.  This trended downward and on my assessment was 155/76.  She is currently asymptomatic with the exception of some nausea.  Her physical exam is benign.  No focal neurologic deficits.  Nothing to suggest central vertigo.  Given recent upper respiratory symptoms, suspect she may have some element of peripheral vertigo.  No indication for hypertensive urgency or emergency at this time.  Her EKG is normal without evidence of ischemia.  Troponin is initially negative.  Chest x-ray shows no evidence of pneumothorax or pneumonia.  Patient was given meclizine  and Zofran.  On recheck, she remains asymptomatic and has not had any recurrence of chest pain or dizziness.  She states she feels much better.  Blood pressure now is 138/71.  Repeat troponin at 0100 remains negative.  This is 5 hours after onset of chest discomfort.  Patient's  heart score is 3.  At this time, feel she can be followed up closely by her cardiologist for repeat evaluation.  Difficult to fully put together her symptoms but I suspect she had an episode of vertigo which was likely exacerbated by or exacerbated her blood pressure.  I have encouraged her to continue to take her blood pressure medications follow-up closely with her cardiologist.  Will discharge home with meclizine.  After history, exam, and medical workup I feel the patient has been appropriately medically screened and is safe for discharge home. Pertinent diagnoses were discussed with the patient. Patient was given return precautions.   Final Clinical Impressions(s) / ED Diagnoses   Final diagnoses:  Atypical chest pain  Essential hypertension  Vertigo    ED Discharge Orders         Ordered    meclizine (ANTIVERT) 25 MG tablet  3 times daily PRN     06/26/18 0134           Merryl Hacker, MD 06/26/18 0139

## 2018-06-26 LAB — POCT I-STAT TROPONIN I: Troponin i, poc: 0 ng/mL (ref 0.00–0.08)

## 2018-06-26 MED ORDER — MECLIZINE HCL 25 MG PO TABS: 25.0000 mg | ORAL_TABLET | Freq: Three times a day (TID) | ORAL | 0 refills | Status: DC | PRN

## 2018-06-26 NOTE — Discharge Instructions (Addendum)
You were seen today for multiple complaints.  You were notably hypertensive.  He also had episode of vertigo and some chest pain.  It is very important that you follow-up with your cardiologist for outpatient reevaluation.  You likely need stress testing.  Take meclizine as needed for vertigo.  Continue to take your blood pressure medications.  If you have any new or worsening symptoms you need to be reevaluated.

## 2018-06-27 ENCOUNTER — Ambulatory Visit: Payer: BC Managed Care – PPO | Admitting: Physical Therapy

## 2018-07-05 ENCOUNTER — Telehealth: Payer: Self-pay

## 2018-07-05 NOTE — Telephone Encounter (Signed)
Megan May Please advise on MRI?

## 2018-07-05 NOTE — Telephone Encounter (Signed)
Copied from [place] [id number]. Topic: General - Inquiry >> Jul 05, 2018  3:40 PM Vernona Rieger wrote: Reason for CRM: Patient is calling the check the status of the MRI that Dr Charlett Blake wanted her to have. It looks like the referral has been withdrawn. Please contact pt

## 2018-07-06 NOTE — Telephone Encounter (Signed)
I sent a message stating the MRI was denied and to please advise what action Dr. Charlett Blake wanted to take . I never received a response

## 2018-07-07 NOTE — Telephone Encounter (Signed)
MRI was denied 3 times, please advise for further instructions for patient

## 2018-07-07 NOTE — Telephone Encounter (Signed)
Since her insurance said no she can either be referred to ortho so they can order or come back in and we can document conservative management failure and then try to order again and see if that helps

## 2018-07-08 ENCOUNTER — Other Ambulatory Visit: Payer: Self-pay | Admitting: Family Medicine

## 2018-07-08 DIAGNOSIS — M542 Cervicalgia: Secondary | ICD-10-CM

## 2018-07-08 NOTE — Telephone Encounter (Signed)
I have laced the ref. Please advise on Dz

## 2018-07-08 NOTE — Telephone Encounter (Signed)
Referral placed.

## 2018-07-08 NOTE — Telephone Encounter (Signed)
Spoke with patient she states she would like to be seen by ortho if they can get the MRI done sooner and to be seen sometime this year due to her insurance company, she stated starting next year she will be paying out of pocket for things.   Please advise  I will place ref to ortho

## 2018-07-11 ENCOUNTER — Ambulatory Visit: Payer: BC Managed Care – PPO | Admitting: Physical Therapy

## 2018-07-11 DIAGNOSIS — M25562 Pain in left knee: Secondary | ICD-10-CM

## 2018-07-11 DIAGNOSIS — G8929 Other chronic pain: Secondary | ICD-10-CM | POA: Diagnosis not present

## 2018-07-11 DIAGNOSIS — M25561 Pain in right knee: Secondary | ICD-10-CM

## 2018-07-12 ENCOUNTER — Encounter: Payer: Self-pay | Admitting: Physical Therapy

## 2018-07-12 NOTE — Patient Instructions (Signed)
Quad sets 2x10 SLR x10 Side lying hip abdx10 LAQ x10 bil;  All 2x/day

## 2018-07-13 ENCOUNTER — Encounter: Payer: Self-pay | Admitting: Physical Therapy

## 2018-07-13 NOTE — Therapy (Signed)
[facility] [address] [place], [place], [address] Phone: [phone]   Fax:  [phone]  Physical Therapy Evaluation  Patient Details  Name: Megan May MRN: [id number] Date of Birth: 1961-06-08 Referring Provider (PT): Charlann Boxer   Encounter Date: 07/11/2018  PT End of Session - 07/12/18 1347    Visit Number  1    Number of Visits  12    Date for PT Re-Evaluation  08/22/18    Authorization Type  BCBS    PT Start Time  1608    PT Stop Time  1648    PT Time Calculation (min)  40 min       Past Medical History:  Diagnosis Date  . Anxiety   . Asthma   . Depression    history of   . Eosinophilic esophagitis   . Fatigue 07/02/2017  . Generalized anxiety disorder   . Hyperglycemia 03/15/2016  . Hyperlipidemia, mixed 06/30/2016  . Hypersomnia, recurrent   . Hypertension   . Migraine   . Migraines   . Posttraumatic stress disorder     Past Surgical History:  Procedure Laterality Date  . CESAREAN SECTION  2004  . FRACTURE SURGERY  1973   R arm  . LAPAROTOMY Bilateral 06/14/2014   Procedure: LAPAROTOMY with right  salpingo-oophorectomy and left salpingectomy;  Surgeon: Linda Hedges, DO;  Location: [facility];  Service: Gynecology;  Laterality: Bilateral;  . TONSILLECTOMY AND ADENOIDECTOMY  1969    There were no vitals filed for this visit.   Subjective Assessment - 07/13/18 0832    Subjective  Pt reports signficant pain in R knee, she did have pain with bending/squatting, kneeling. She had injection on 9/18, that has given her good relief. She also states "same" pain in L knee recently, that is less intense. Pt works 2 part time jobs, as Agricultural engineer.  She also has daughter that she cares for.     Currently in Pain?  Yes    Pain Score  0-No pain    Pain Location  Knee    Pain Orientation  Right    Multiple Pain Sites  Yes    Pain Score  2    Pain Location  Knee    Pain Orientation  Left    Pain  Descriptors / Indicators  Aching    Pain Type  Acute pain    Pain Onset  1 to 4 weeks ago    Pain Frequency  Intermittent    Aggravating Factors   popping with activity          [facility] PT Assessment - 07/13/18 0830      Assessment   Medical Diagnosis  Right knee pain    Referring Provider (PT)  Charlann Boxer    Prior Therapy  No      Precautions   Precautions  None      Balance Screen   Has the patient fallen in the past 6 months  No      Prior Function   Level of Independence  Independent      Cognition   Overall Cognitive Status  Within Functional Limits for tasks assessed      AROM   Overall AROM Comments  Knee: WFL:  Hips: WNL      Strength   Overall Strength Comments  Knee: 4/5 bil ;  Hips: 4-/5 bil;       Palpation   Palpation comment  Lateral patella tilt bilaterally;  Special Tests   Other special tests  Decreased patella tracking bilaterally;                 Objective measurements completed on examination: See above findings.      [facility] Adult PT Treatment/Exercise - 07/13/18 0831      Exercises   Exercises  Knee/Hip      Knee/Hip Exercises: Seated   Long Arc Quad  10 reps;Both      Knee/Hip Exercises: Supine   Quad Sets  20 reps    Straight Leg Raises  10 reps;Both      Knee/Hip Exercises: Sidelying   Hip ABduction  10 reps;Both             PT Education - 07/12/18 1346    Education Details  PT POC, initial HEP    Person(s) Educated  Patient    Methods  Explanation;Demonstration;Handout;Verbal cues    Comprehension  Verbalized understanding;Need further instruction;Verbal cues required       PT Short Term Goals - 07/13/18 0837      PT SHORT TERM GOAL #1   Title  Pt to be independent with initial HEP     Time  2    Period  Weeks    Status  New    Target Date  07/25/18        PT Long Term Goals - 07/13/18 0838      PT LONG TERM GOAL #1   Title  Pt to be independent with long term HEP for Strengthening of Bil  LEs     Time  6    Period  Weeks    Status  New    Target Date  08/22/18      PT LONG TERM GOAL #2   Title  Pt to report decreased pain in L and R knee, to 0-2/10 with activity     Time  6    Period  Weeks    Status  New    Target Date  08/22/18      PT LONG TERM GOAL #3   Title  Pt to demo increased strength of Bil hips and knees, to at least 4+/5 to improve stability and pain.     Time  6    Period  Weeks    Status  New    Target Date  08/22/18             Plan - 07/13/18 0840    Clinical Impression Statement  Pt presents with primary complaint of increased knee pain. R knee was main concern, now has decreased pain in R, from recent injection. L knee is now also bothering her. Pt with weakness of hips and quads, with lack of effective HEP for her Diagnosis, and will benefit from education on this. Pt with  decreased ability for full functional activities, due to pain in knees. She has decreased ability for squatting, stooping, work duties, and [employer]. Pt to benefit from skilled PT to improve pain, strength, and teach HEP. Pts symptoms consisted with OA. Discussed recommendations for not kneeling or squatting at this time.     Clinical Presentation  Stable    Clinical Decision Making  Low    Rehab Potential  Good    PT Frequency  2x / week    PT Duration  6 weeks    PT Treatment/Interventions  ADLs/Self Care Home Management;Cryotherapy;Electrical Stimulation;Iontophoresis 4mg /ml Dexamethasone;Moist Heat;Therapeutic activities;Functional mobility training;Stair training;Gait training;DME Instruction;Ultrasound;Therapeutic exercise;Balance training;Neuromuscular re-education;Patient/family education;Orthotic Fit/Training;Dry  needling;Passive range of motion;Manual techniques;Taping;Joint Manipulations    PT Next Visit Plan  Progress LE strength/ Progress HEP    Consulted and Agree with Plan of Care  Patient       Patient will benefit from skilled therapeutic intervention in  order to improve the following deficits and impairments:  Abnormal gait, Decreased endurance, Decreased activity tolerance, Decreased strength, Pain, Difficulty walking, Decreased mobility, Decreased balance, Improper body mechanics  Visit Diagnosis: Acute pain of left knee  Chronic pain of right knee     Problem List Patient Active Problem List   Diagnosis Date Noted  . Patellofemoral arthritis of right knee 03/25/2018  . Chronic pain of right knee 03/16/2018  . Injury of cervical spine ([place]) 03/16/2018  . Fatigue 07/02/2017  . Hyperlipidemia, mixed 06/30/2016  . Hyperglycemia 03/15/2016  . Atypical chest pain 01/17/2016  . RUQ pain 01/17/2016  . Right hip pain 03/24/2015  . Leukocytopenia 03/24/2015  . Bursitis of left shoulder 03/22/2015  . Greater trochanteric bursitis of right hip 03/22/2015  . S/P laparotomy 06/14/2014  . Diverticulosis of colon without hemorrhage  colonoscopy 2015 05/27/2014  . Colon polyp 05/02/2014  . Microscopic hematuria work up urology  see 01/2014 note 04/22/2014  . Hypersomnia, recurrent   . Dysarthria 05/01/2013  . Mastodynia 10/13/2012  . Mass of right breast 10/13/2012  . Essential hypertension, benign 06/29/2011  . Migraines 06/29/2011  . Anxiety 06/29/2011  . Eosinophilic esophagitis 01/31/1600  . Lupus anticoagulant positive 06/29/2011   Lyndee Hensen, PT, DPT 8:47 AM  07/13/18    [facility] [place], [place], [address] Phone: [phone]   Fax:  [phone]  Name: Megan May MRN: [id number] Date of Birth: 1961/03/22

## 2018-07-18 ENCOUNTER — Encounter (INDEPENDENT_AMBULATORY_CARE_PROVIDER_SITE_OTHER): Payer: Self-pay | Admitting: Family Medicine

## 2018-07-18 ENCOUNTER — Ambulatory Visit (INDEPENDENT_AMBULATORY_CARE_PROVIDER_SITE_OTHER): Payer: BC Managed Care – PPO | Admitting: Family Medicine

## 2018-07-18 DIAGNOSIS — M25562 Pain in left knee: Secondary | ICD-10-CM

## 2018-07-18 DIAGNOSIS — M25561 Pain in right knee: Secondary | ICD-10-CM | POA: Diagnosis not present

## 2018-07-18 DIAGNOSIS — M542 Cervicalgia: Secondary | ICD-10-CM | POA: Diagnosis not present

## 2018-07-18 DIAGNOSIS — G8929 Other chronic pain: Secondary | ICD-10-CM

## 2018-07-18 NOTE — Progress Notes (Signed)
Office Visit Note   Patient: Megan May           Date of Birth: [birth year]           MRN: [id number] Visit Date: 07/18/2018 Requested by: Mosie Lukes, MD [address] [place], [place] [address] PCP: Mosie Lukes, MD  Subjective: Chief Complaint  Patient presents with  . Neck - Pain    Pain in neck x 1 year. Unsure of any injury. Did have numbness in the 5th finger of left hand x 2 months, but this has resolved. No other radiating symptoms.     HPI: She is here with neck pain.  About a year ago she was at work as a Chief Technology Officer.  1 of her students came up behind her and jumped on her.  This was a 89-year-old child, but patient was not expecting it and she felt immediate pain to her neck.  She thought it would get better and did not seek immediate treatment.  After a few weeks she was still having symptoms but then her father died and she had to help take care of things related to his death.  In the summertime she still was having pain so she went to her PCP who ordered x-rays which were reviewed today showing C5-6 and C6-7 degenerative disc disease with facet joint arthropathy and straightening of the cervical spine.  MRI was ordered but was denied by [employer].  They have apparently denied her claim entirely.  She was having some left fifth finger numbness and tingling during the summertime but that seems to have resolved.  Now her neck pain is near the C7 area to the right of midline and into the shoulder blade.  She is right-hand dominant.                ROS: Otherwise noncontributory.  Objective: Vital Signs: LMP 05/10/2014 (Approximate)   Physical Exam:  Neck: Negative Spurling's test.  Slightly decreased flexion and extension and rotation at the extremes.  She is tender to the right of the C7 spinous process.  She has muscular tightness in the right trapezius belly and in the rhomboid area.  Upper extremity strength and  reflexes are normal bilaterally.  Imaging: None today, previous neck x-rays were reviewed on computer.  Assessment & Plan: 1.  Chronic neck pain status post work-related injury, suspect aggravation of pre-existing but previously asymptomatic C5-6 and C6-7 degenerative disc disease.  Cannot rule out disc protrusion although neurologic exam is nonfocal. -Referral to physical therapy.  She is currently going for her knees, so we will ask them to address her neck and her knees.  We will switch to [facility] location for convenience. -MRI cervical spine if fails to improve.   Follow-Up Instructions: No follow-ups on file.      Procedures: No procedures performed  No notes on file    PMFS History: Patient Active Problem List   Diagnosis Date Noted  . Patellofemoral arthritis of right knee 03/25/2018  . Chronic pain of right knee 03/16/2018  . Injury of cervical spine ([place]) 03/16/2018  . Fatigue 07/02/2017  . Hyperlipidemia, mixed 06/30/2016  . Hyperglycemia 03/15/2016  . Atypical chest pain 01/17/2016  . RUQ pain 01/17/2016  . Right hip pain 03/24/2015  . Leukocytopenia 03/24/2015  . Bursitis of left shoulder 03/22/2015  . Greater trochanteric bursitis of right hip 03/22/2015  . S/P laparotomy 06/14/2014  . Diverticulosis of colon without  hemorrhage  colonoscopy 2015 05/27/2014  . Colon polyp 05/02/2014  . Microscopic hematuria work up urology  see 01/2014 note 04/22/2014  . Hypersomnia, recurrent   . Dysarthria 05/01/2013  . Mastodynia 10/13/2012  . Mass of right breast 10/13/2012  . Essential hypertension, benign 06/29/2011  . Migraines 06/29/2011  . Anxiety 06/29/2011  . Eosinophilic esophagitis 40/98/1191  . Lupus anticoagulant positive 06/29/2011   Past Medical History:  Diagnosis Date  . Anxiety   . Asthma   . Depression    history of   . Eosinophilic esophagitis   . Fatigue 07/02/2017  . Generalized anxiety disorder   . Hyperglycemia 03/15/2016  .  Hyperlipidemia, mixed 06/30/2016  . Hypersomnia, recurrent   . Hypertension   . Migraine   . Migraines   . Posttraumatic stress disorder     Family History  Problem Relation Age of Onset  . Hypertension Father   . Heart disease Father   . Diabetes Father   . CAD Father   . Stroke Maternal Grandfather   . Stroke Maternal Grandmother   . Diabetes Paternal Grandmother   . Other Cousin        bicuspid aortic valve  . Diabetes Brother     Past Surgical History:  Procedure Laterality Date  . CESAREAN SECTION  2004  . FRACTURE SURGERY  1973   R arm  . LAPAROTOMY Bilateral 06/14/2014   Procedure: LAPAROTOMY with right  salpingo-oophorectomy and left salpingectomy;  Surgeon: Linda Hedges, DO;  Location: [facility];  Service: Gynecology;  Laterality: Bilateral;  . TONSILLECTOMY AND ADENOIDECTOMY  1969   Social History   Occupational History  . Occupation: SPECIAL EDUCATOR    Employer: [employer]    Comment: Teacher, early years/pre  Tobacco Use  . Smoking status: Never Smoker  . Smokeless tobacco: Never Used  Substance and Sexual Activity  . Alcohol use: No  . Drug use: No  . Sexual activity: Yes    Birth control/protection: Surgical

## 2018-07-19 ENCOUNTER — Encounter: Payer: Self-pay | Admitting: Physical Therapy

## 2018-07-19 ENCOUNTER — Ambulatory Visit (INDEPENDENT_AMBULATORY_CARE_PROVIDER_SITE_OTHER): Payer: BC Managed Care – PPO | Admitting: Physical Therapy

## 2018-07-19 DIAGNOSIS — M25561 Pain in right knee: Secondary | ICD-10-CM

## 2018-07-19 DIAGNOSIS — G8929 Other chronic pain: Secondary | ICD-10-CM | POA: Diagnosis not present

## 2018-07-19 DIAGNOSIS — M25562 Pain in left knee: Secondary | ICD-10-CM

## 2018-07-19 NOTE — Therapy (Signed)
[facility] [address] [place], [place], [address] Phone: [phone]   Fax:  [phone]  Physical Therapy Treatment  Patient Details  Name: Megan May MRN: [id number] Date of Birth: 01-Nov-1960 Referring Provider (PT): Charlann Boxer   Encounter Date: 07/19/2018  PT End of Session - 07/19/18 1609    Visit Number  2    Number of Visits  12    Date for PT Re-Evaluation  08/22/18    Authorization Type  BCBS    PT Start Time  1602    PT Stop Time  1643    PT Time Calculation (min)  41 min    Behavior During Therapy  [facility] for tasks assessed/performed       Past Medical History:  Diagnosis Date  . Anxiety   . Asthma   . Depression    history of   . Eosinophilic esophagitis   . Fatigue 07/02/2017  . Generalized anxiety disorder   . Hyperglycemia 03/15/2016  . Hyperlipidemia, mixed 06/30/2016  . Hypersomnia, recurrent   . Hypertension   . Migraine   . Migraines   . Posttraumatic stress disorder     Past Surgical History:  Procedure Laterality Date  . CESAREAN SECTION  2004  . FRACTURE SURGERY  1973   R arm  . LAPAROTOMY Bilateral 06/14/2014   Procedure: LAPAROTOMY with right  salpingo-oophorectomy and left salpingectomy;  Surgeon: Linda Hedges, DO;  Location: [facility];  Service: Gynecology;  Laterality: Bilateral;  . TONSILLECTOMY AND ADENOIDECTOMY  1969    There were no vitals filed for this visit.  Subjective Assessment - 07/19/18 1606    Subjective  Pt states pain in R knee was hurting with stairs today, L knee hurting somewhat now. She states doing HEP.     Currently in Pain?  No/denies    Pain Score  0-No pain    Pain Orientation  Right    Pain Score  2    Pain Location  Knee    Pain Orientation  Left    Pain Descriptors / Indicators  Aching    Pain Type  Acute pain    Pain Onset  1 to 4 weeks ago    Pain Frequency  Intermittent                       [facility] Adult PT  Treatment/Exercise - 07/19/18 1610      Exercises   Exercises  Knee/Hip      Knee/Hip Exercises: Aerobic   Stationary Bike  L1 x 8 min       Knee/Hip Exercises: Standing   Heel Raises  20 reps    Hip Flexion  20 reps;Knee bent    Hip Abduction  20 reps    Forward Step Up  10 reps;Both;Hand Hold: 1;Step Height: 6"    Other Standing Knee Exercises  Tandem stance 30 sec bil; WIth head turns x10 bil;       Knee/Hip Exercises: Seated   Long Arc Quad  Both;20 reps    Long Arc Quad Weight  2 lbs.    Hamstring Curl  20 reps    Hamstring Limitations  RTB      Knee/Hip Exercises: Supine   Quad Sets  --    Bridges  20 reps    Straight Leg Raises  Both;20 reps      Knee/Hip Exercises: Sidelying   Hip ABduction  10 reps;Both  PT Short Term Goals - 07/13/18 0837      PT SHORT TERM GOAL #1   Title  Pt to be independent with initial HEP     Time  2    Period  Weeks    Status  New    Target Date  07/25/18        PT Long Term Goals - 07/13/18 0838      PT LONG TERM GOAL #1   Title  Pt to be independent with long term HEP for Strengthening of Bil LEs     Time  6    Period  Weeks    Status  New    Target Date  08/22/18      PT LONG TERM GOAL #2   Title  Pt to report decreased pain in L and R knee, to 0-2/10 with activity     Time  6    Period  Weeks    Status  New    Target Date  08/22/18      PT LONG TERM GOAL #3   Title  Pt to demo increased strength of Bil hips and knees, to at least 4+/5 to improve stability and pain.     Time  6    Period  Weeks    Status  New    Target Date  08/22/18              Patient will benefit from skilled therapeutic intervention in order to improve the following deficits and impairments:     Visit Diagnosis: Chronic pain of right knee  Acute pain of left knee     Problem List Patient Active Problem List   Diagnosis Date Noted  . Patellofemoral arthritis of right knee 03/25/2018  . Chronic pain of  right knee 03/16/2018  . Injury of cervical spine ([place]) 03/16/2018  . Fatigue 07/02/2017  . Hyperlipidemia, mixed 06/30/2016  . Hyperglycemia 03/15/2016  . Atypical chest pain 01/17/2016  . RUQ pain 01/17/2016  . Right hip pain 03/24/2015  . Leukocytopenia 03/24/2015  . Bursitis of left shoulder 03/22/2015  . Greater trochanteric bursitis of right hip 03/22/2015  . S/P laparotomy 06/14/2014  . Diverticulosis of colon without hemorrhage  colonoscopy 2015 05/27/2014  . Colon polyp 05/02/2014  . Microscopic hematuria work up urology  see 01/2014 note 04/22/2014  . Hypersomnia, recurrent   . Dysarthria 05/01/2013  . Mastodynia 10/13/2012  . Mass of right breast 10/13/2012  . Essential hypertension, benign 06/29/2011  . Migraines 06/29/2011  . Anxiety 06/29/2011  . Eosinophilic esophagitis 49/70/2637  . Lupus anticoagulant positive 06/29/2011   Lyndee Hensen, PT, DPT 4:46 PM  07/19/18    [facility] [place], [place], [address] Phone: [phone]   Fax:  [phone]  Name: MADISUN HARGROVE MRN: [id number] Date of Birth: 05-Apr-1961

## 2018-07-20 ENCOUNTER — Other Ambulatory Visit (INDEPENDENT_AMBULATORY_CARE_PROVIDER_SITE_OTHER): Payer: Self-pay | Admitting: Family Medicine

## 2018-07-20 DIAGNOSIS — M542 Cervicalgia: Secondary | ICD-10-CM

## 2018-07-20 NOTE — Addendum Note (Signed)
Addended by: Daylene Posey T on: 07/20/2018 03:11 PM   Modules accepted: Orders

## 2018-07-22 ENCOUNTER — Encounter: Payer: Self-pay | Admitting: Family Medicine

## 2018-07-23 ENCOUNTER — Ambulatory Visit (HOSPITAL_BASED_OUTPATIENT_CLINIC_OR_DEPARTMENT_OTHER): Payer: BC Managed Care – PPO

## 2018-08-01 ENCOUNTER — Telehealth (INDEPENDENT_AMBULATORY_CARE_PROVIDER_SITE_OTHER): Payer: Self-pay | Admitting: Family Medicine

## 2018-08-01 NOTE — Telephone Encounter (Signed)
Advised patient of the results. Her physical therapist has just been working on her knees for now, but she does have a referral in place there for PT on her neck also.  She will speak to the therapist at her next appointment there, to have her start on her neck, as well as continue on her knees. She will let us know if she fails to improve, for referral to surgeon vs referral for ESI.

## 2018-08-01 NOTE — Telephone Encounter (Signed)
Cervical spine MRI report is notable for the following:  Facet arthropathy and spurring causing severe foraminal narrowing at C4-5 on the right, and a disc osteophyte complex at C5-6 causing severe left and moderate right foraminal narrowing, there is moderate central and severe by foraminal narrowing at C6-7.  If symptoms do not improve with therapy, then possible surgical consult versus referral for epidural steroid injection.

## 2018-08-01 NOTE — Telephone Encounter (Signed)
I called the patient with these results. See other message on this from today.

## 2018-08-01 NOTE — Telephone Encounter (Signed)
Patient called wanting to know the results of her MRI that was done on July 22, 2018 at [facility].  CB#[phone].  Thank you.

## 2018-08-04 ENCOUNTER — Ambulatory Visit: Payer: BC Managed Care – PPO | Admitting: Physical Therapy

## 2018-08-04 DIAGNOSIS — M542 Cervicalgia: Secondary | ICD-10-CM | POA: Diagnosis not present

## 2018-08-04 DIAGNOSIS — M25561 Pain in right knee: Secondary | ICD-10-CM

## 2018-08-04 DIAGNOSIS — M25562 Pain in left knee: Secondary | ICD-10-CM | POA: Diagnosis not present

## 2018-08-04 DIAGNOSIS — G8929 Other chronic pain: Secondary | ICD-10-CM

## 2018-08-04 NOTE — Patient Instructions (Signed)
Access Code: [email or web address]  URL: [email or web address]  Date: 08/04/2018  Prepared by: Lyndee Hensen   Exercises  Seated Cervical Sidebending Stretch - 3 reps - 30 hold - 3x daily  Neck Rotation - 10 reps - 1 sets - 2x daily  Seated Cervical Retraction - 10 reps - 2 sets - 3 hold - 2x daily  Seated Scapular Retraction - 10 reps - 2 sets - 3 hold - 2x daily  Standing Backward Shoulder Rolls - 10 reps - 1 sets - 2x daily  Straight Leg Raise - 10 reps - 2 sets - 1x daily  Sidelying Hip Abduction - 10 reps - 2 sets - 1x daily  Seated Long Arc Quad - 10 reps - 2 sets - 2x daily  Supine Quad Set - 10 reps - 2 sets - 2x daily  Seated Hamstring Stretch - 3 reps - 30 hold - 2x daily

## 2018-08-09 ENCOUNTER — Encounter: Payer: Self-pay | Admitting: Physical Therapy

## 2018-08-09 NOTE — Therapy (Signed)
[facility] [address] [place], [place], [address] Phone: [phone]   Fax:  [phone]  Physical Therapy Treatment/Re-Eval   Patient Details  Name: Megan May MRN: [id number] Date of Birth: 09-04-1960 Referring Provider (PT): Legrand Como Hilts// Charlann Boxer   Encounter Date: 08/04/2018  PT End of Session - 08/09/18 1410    Visit Number  3    Number of Visits  12    Date for PT Re-Evaluation  08/22/18    Authorization Type  BCBS    PT Start Time  1602    PT Stop Time  1645    PT Time Calculation (min)  43 min    Activity Tolerance  Patient tolerated treatment well    Behavior During Therapy  [facility] for tasks assessed/performed       Past Medical History:  Diagnosis Date  . Anxiety   . Asthma   . Depression    history of   . Eosinophilic esophagitis   . Fatigue 07/02/2017  . Generalized anxiety disorder   . Hyperglycemia 03/15/2016  . Hyperlipidemia, mixed 06/30/2016  . Hypersomnia, recurrent   . Hypertension   . Migraine   . Migraines   . Posttraumatic stress disorder     Past Surgical History:  Procedure Laterality Date  . CESAREAN SECTION  2004  . FRACTURE SURGERY  1973   R arm  . LAPAROTOMY Bilateral 06/14/2014   Procedure: LAPAROTOMY with right  salpingo-oophorectomy and left salpingectomy;  Surgeon: Linda Hedges, DO;  Location: [facility];  Service: Gynecology;  Laterality: Bilateral;  . TONSILLECTOMY AND ADENOIDECTOMY  1969    There were no vitals filed for this visit.  Subjective Assessment - 08/09/18 1406    Subjective  Pt returns to PT today, last seen 12/17. She saw MD for neck pain again for follow up, pt now wishes to focus on her neck pain vs her knee pain. She states that neck has been more painful for her recently, and would like to do Eval for this today. She does have script for both neck and knees. She states neck pain started after incident at work quite a while ago. She has had recent  imaging, showing significant degeneration and narrowing.     Limitations  House hold activities;Sitting;Lifting;Standing    Currently in Pain?  Yes    Pain Score  5     Pain Location  Neck    Pain Orientation  Right;Left    Pain Descriptors / Indicators  Aching;Tightness    Pain Type  Acute pain    Pain Onset  More than a month ago    Pain Frequency  Intermittent    Multiple Pain Sites  Yes    Pain Score  3    Pain Location  Knee    Pain Orientation  Right;Left    Pain Descriptors / Indicators  Aching    Pain Type  Chronic pain    Pain Onset  More than a month ago    Pain Frequency  Intermittent         [facility] PT Assessment - 08/09/18 0001      Assessment   Medical Diagnosis  Neck pain// R knee pain    Referring Provider (PT)  Legrand Como Hilts// Charlann Boxer    Prior Therapy  no      Precautions   Precautions  None      Balance Screen   Has the patient fallen in the past 6 months  No  Prior Function   Level of Independence  Independent      Cognition   Overall Cognitive Status  Within Functional Limits for tasks assessed      Posture/Postural Control   Posture Comments  rounded shoulders, forward head      AROM   Overall AROM Comments  Neck: Flexion: WFL:  extension: mild limitation/pain;  L rotation: mild limitation;  R rotation: moderate limitation/ pain      Strength   Overall Strength Comments  UE: 4/5;Rhomboid: 4-/5      Palpation   Palpation comment  Painful trigger points, and tightness in bilateral upper traps, sub occiptials, and paraspinals;        Special Tests   Other special tests  Neg ULTT/ Denies tingling into UEs at this time.                    [facility] Adult PT Treatment/Exercise - 08/09/18 0001      Exercises   Exercises  Neck      Neck Exercises: Seated   Neck Retraction  15 reps    Cervical Rotation  10 reps    Shoulder Rolls  20 reps    Other Seated Exercise  Scap retraction x15;       Neck Exercises: Stretches   Upper  Trapezius Stretch  3 reps;30 seconds             PT Education - 08/09/18 1409    Education Details  PT POC, initial HEP for neck.     Person(s) Educated  Patient    Methods  Explanation    Comprehension  Verbalized understanding       PT Short Term Goals - 08/09/18 1412      PT SHORT TERM GOAL #1   Title  Pt to be independent with initial HEP for neck and knees.    Time  2    Period  Weeks    Status  New    Target Date  08/18/18        PT Long Term Goals - 08/09/18 1412      PT LONG TERM GOAL #1   Title  Pt to be independent with long term HEP for Strengthening of Bil LEs , and for posture/neck pain.     Time  6    Period  Weeks    Status  Revised    Target Date  09/15/18      PT LONG TERM GOAL #2   Title  Pt to report decreased pain in L and R knee, to 0-2/10 with activity     Time  6    Period  Weeks    Status  On-going    Target Date  09/15/18      PT LONG TERM GOAL #3   Title  Pt to demo increased strength of Bil hips and knees, to at least 4+/5 to improve stability and pain.     Time  6    Period  Weeks    Status  On-going    Target Date  09/15/18      PT LONG TERM GOAL #4   Title  Pt to report decreased pain in neck, to 0-2/10 with activity and work duties.     Time  6    Period  Weeks    Status  New    Target Date  09/15/18      PT LONG TERM GOAL #5   Title  Pt  to demo soft tissue restrictions in neck and UT region, to be [facility], to improve pain and movemnt.     Time  6    Period  Weeks    Status  New    Target Date  09/15/18            Plan - 08/09/18 1415    Clinical Impression Statement  Re-Eval done today, to include diagnosis of neck pain. Pt has script for both neck and knee pain, pt wishes to focus mainly on neck pain at this time. Secondary diagnosis will be knee pain. Discussed need to continue LE HEP while we are working on neck. Pt with increased tightness, trigger points and pain in Bil UT region. She has decreased ROM for R  rotation vs L, and pain with movement. She has decreased ability for reaching, lifting, IADLs, and work duties, due to pain in neck. Pt with lack of effective HEP for posture and neck pain. Pt to benefit from skilled PT to improve pain in neck., will continue to review LE strengthenign exercises for knees as well.     Clinical Presentation  Stable    Clinical Decision Making  Low    Rehab Potential  Good    PT Frequency  2x / week    PT Duration  6 weeks    PT Treatment/Interventions  ADLs/Self Care Home Management;Cryotherapy;Electrical Stimulation;Iontophoresis 4mg /ml Dexamethasone;Moist Heat;Therapeutic activities;Functional mobility training;Stair training;Gait training;DME Instruction;Ultrasound;Therapeutic exercise;Balance training;Neuromuscular re-education;Patient/family education;Orthotic Fit/Training;Dry needling;Passive range of motion;Manual techniques;Taping;Joint Manipulations    Consulted and Agree with Plan of Care  Patient       Patient will benefit from skilled therapeutic intervention in order to improve the following deficits and impairments:  Abnormal gait, Decreased endurance, Decreased activity tolerance, Decreased strength, Pain, Difficulty walking, Decreased mobility, Decreased balance, Improper body mechanics, Decreased range of motion, Increased muscle spasms, Hypomobility, Impaired flexibility  Visit Diagnosis: Cervicalgia  Chronic pain of right knee  Acute pain of left knee     Problem List Patient Active Problem List   Diagnosis Date Noted  . Patellofemoral arthritis of right knee 03/25/2018  . Chronic pain of right knee 03/16/2018  . Injury of cervical spine ([place]) 03/16/2018  . Fatigue 07/02/2017  . Hyperlipidemia, mixed 06/30/2016  . Hyperglycemia 03/15/2016  . Atypical chest pain 01/17/2016  . RUQ pain 01/17/2016  . Right hip pain 03/24/2015  . Leukocytopenia 03/24/2015  . Bursitis of left shoulder 03/22/2015  . Greater trochanteric bursitis of  right hip 03/22/2015  . S/P laparotomy 06/14/2014  . Diverticulosis of colon without hemorrhage  colonoscopy 2015 05/27/2014  . Colon polyp 05/02/2014  . Microscopic hematuria work up urology  see 01/2014 note 04/22/2014  . Hypersomnia, recurrent   . Dysarthria 05/01/2013  . Mastodynia 10/13/2012  . Mass of right breast 10/13/2012  . Essential hypertension, benign 06/29/2011  . Migraines 06/29/2011  . Anxiety 06/29/2011  . Eosinophilic esophagitis 00/86/7619  . Lupus anticoagulant positive 06/29/2011     Lyndee Hensen, PT, DPT 2:23 PM  08/09/18   [place], [place], [address] Phone: [phone]   Fax:  [phone]  Name: Megan May MRN: [id number] Date of Birth: Apr 04, 1961

## 2018-08-10 ENCOUNTER — Encounter: Payer: Self-pay | Admitting: Physical Therapy

## 2018-08-10 ENCOUNTER — Ambulatory Visit: Payer: BC Managed Care – PPO | Admitting: Physical Therapy

## 2018-08-10 DIAGNOSIS — M542 Cervicalgia: Secondary | ICD-10-CM | POA: Diagnosis not present

## 2018-08-10 DIAGNOSIS — G8929 Other chronic pain: Secondary | ICD-10-CM

## 2018-08-10 DIAGNOSIS — M25561 Pain in right knee: Secondary | ICD-10-CM | POA: Diagnosis not present

## 2018-08-10 NOTE — Therapy (Signed)
[facility] [address] [place], [place], [address] Phone: [phone]   Fax:  [phone]  Physical Therapy Treatment  Patient Details  Name: Megan May MRN: [id number] Date of Birth: 1960/12/22 Referring Provider (PT): Legrand Como Hilts// Charlann Boxer   Encounter Date: 08/10/2018  PT End of Session - 08/10/18 1649    Visit Number  4    Number of Visits  12    Date for PT Re-Evaluation  08/22/18    Authorization Type  BCBS    PT Start Time  1556    PT Stop Time  1638    PT Time Calculation (min)  42 min    Activity Tolerance  Patient tolerated treatment well    Behavior During Therapy  [facility] for tasks assessed/performed       Past Medical History:  Diagnosis Date  . Anxiety   . Asthma   . Depression    history of   . Eosinophilic esophagitis   . Fatigue 07/02/2017  . Generalized anxiety disorder   . Hyperglycemia 03/15/2016  . Hyperlipidemia, mixed 06/30/2016  . Hypersomnia, recurrent   . Hypertension   . Migraine   . Migraines   . Posttraumatic stress disorder     Past Surgical History:  Procedure Laterality Date  . CESAREAN SECTION  2004  . FRACTURE SURGERY  1973   R arm  . LAPAROTOMY Bilateral 06/14/2014   Procedure: LAPAROTOMY with right  salpingo-oophorectomy and left salpingectomy;  Surgeon: Linda Hedges, DO;  Location: [facility];  Service: Gynecology;  Laterality: Bilateral;  . TONSILLECTOMY AND ADENOIDECTOMY  1969    There were no vitals filed for this visit.  Subjective Assessment - 08/10/18 1648    Subjective  Pt states pain in neck and into R medial scap border region.     Currently in Pain?  Yes    Pain Score  5     Pain Location  Neck    Pain Orientation  Right;Left    Pain Descriptors / Indicators  Aching;Tightness    Pain Type  Acute pain    Pain Onset  More than a month ago    Pain Frequency  Intermittent                       [facility] Adult PT Treatment/Exercise - 08/10/18  1556      Posture/Postural Control   Posture Comments  --      Exercises   Exercises  Neck      Neck Exercises: Theraband   Rows  20 reps;Red      Neck Exercises: Seated   Neck Retraction  20 reps    Cervical Rotation  10 reps    Shoulder Rolls  20 reps    Other Seated Exercise  Scap retraction x15;       Manual Therapy   Manual Therapy  Joint mobilization;Soft tissue mobilization;Passive ROM;Manual Traction    Joint Mobilization  PA mobs for c-spine    Soft tissue mobilization  STM to bil UT, paraspinals and sub occipitals;     Passive ROM  PROM for rotation and passive stretching for Bil UTs.    Manual Traction  10 sec x10      Neck Exercises: Stretches   Upper Trapezius Stretch  3 reps;30 seconds    Other Neck Stretches  posterior shoulder stretch 30 sec x3 on R;        Trigger Point Dry Needling - 08/10/18 1646  Consent Given?  Yes    Education Handout Provided  Yes    Muscles Treated Upper Body  Upper trapezius;Levator scapulae   bilateral   Upper Trapezius Response  Twitch reponse elicited;Palpable increased muscle length    Levator Scapulae Response  Palpable increased muscle length           PT Education - 08/10/18 1648    Education Details  Education on dry needling implications, benefits, risks.     Methods  Explanation    Comprehension  Verbalized understanding       PT Short Term Goals - 08/09/18 1412      PT SHORT TERM GOAL #1   Title  Pt to be independent with initial HEP for neck and knees.    Time  2    Period  Weeks    Status  New    Target Date  08/18/18        PT Long Term Goals - 08/09/18 1412      PT LONG TERM GOAL #1   Title  Pt to be independent with long term HEP for Strengthening of Bil LEs , and for posture/neck pain.     Time  6    Period  Weeks    Status  Revised    Target Date  09/15/18      PT LONG TERM GOAL #2   Title  Pt to report decreased pain in L and R knee, to 0-2/10 with activity     Time  6    Period   Weeks    Status  On-going    Target Date  09/15/18      PT LONG TERM GOAL #3   Title  Pt to demo increased strength of Bil hips and knees, to at least 4+/5 to improve stability and pain.     Time  6    Period  Weeks    Status  On-going    Target Date  09/15/18      PT LONG TERM GOAL #4   Title  Pt to report decreased pain in neck, to 0-2/10 with activity and work duties.     Time  6    Period  Weeks    Status  New    Target Date  09/15/18      PT LONG TERM GOAL #5   Title  Pt to demo soft tissue restrictions in neck and UT region, to be [facility], to improve pain and movemnt.     Time  6    Period  Weeks    Status  New    Target Date  09/15/18            Plan - 08/10/18 1650    Clinical Impression Statement  Pt with increased tightness and soreness on R UT vs L, but increased pain and twitch response in L vs R today. Pt may benefit from future dry needling for muscle tension and pain. Pt with decreased ROM with R rotation, likley from degeneration, minimal improvment today after mobilization and manual for this. Plan to continue pain relief and ther ex for postural strengthening.     Rehab Potential  Good    PT Frequency  2x / week    PT Duration  6 weeks    PT Treatment/Interventions  ADLs/Self Care Home Management;Cryotherapy;Electrical Stimulation;Iontophoresis 4mg /ml Dexamethasone;Moist Heat;Therapeutic activities;Functional mobility training;Stair training;Gait training;DME Instruction;Ultrasound;Therapeutic exercise;Balance training;Neuromuscular re-education;Patient/family education;Orthotic Fit/Training;Dry needling;Passive range of motion;Manual techniques;Taping;Joint Manipulations    Consulted and  Agree with Plan of Care  Patient       Patient will benefit from skilled therapeutic intervention in order to improve the following deficits and impairments:  Abnormal gait, Decreased endurance, Decreased activity tolerance, Decreased strength, Pain, Difficulty walking,  Decreased mobility, Decreased balance, Improper body mechanics, Decreased range of motion, Increased muscle spasms, Hypomobility, Impaired flexibility  Visit Diagnosis: Cervicalgia  Chronic pain of right knee     Problem List Patient Active Problem List   Diagnosis Date Noted  . Patellofemoral arthritis of right knee 03/25/2018  . Chronic pain of right knee 03/16/2018  . Injury of cervical spine ([place]) 03/16/2018  . Fatigue 07/02/2017  . Hyperlipidemia, mixed 06/30/2016  . Hyperglycemia 03/15/2016  . Atypical chest pain 01/17/2016  . RUQ pain 01/17/2016  . Right hip pain 03/24/2015  . Leukocytopenia 03/24/2015  . Bursitis of left shoulder 03/22/2015  . Greater trochanteric bursitis of right hip 03/22/2015  . S/P laparotomy 06/14/2014  . Diverticulosis of colon without hemorrhage  colonoscopy 2015 05/27/2014  . Colon polyp 05/02/2014  . Microscopic hematuria work up urology  see 01/2014 note 04/22/2014  . Hypersomnia, recurrent   . Dysarthria 05/01/2013  . Mastodynia 10/13/2012  . Mass of right breast 10/13/2012  . Essential hypertension, benign 06/29/2011  . Migraines 06/29/2011  . Anxiety 06/29/2011  . Eosinophilic esophagitis 34/19/3790  . Lupus anticoagulant positive 06/29/2011    Lyndee Hensen, PT, DPT 4:52 PM  08/10/18    [facility] [place], [place], [address] Phone: [phone]   Fax:  [phone]  Name: MADAI NUCCIO MRN: [id number] Date of Birth: Mar 29, 1961

## 2018-08-17 ENCOUNTER — Encounter: Payer: Self-pay | Admitting: Physical Therapy

## 2018-08-17 ENCOUNTER — Ambulatory Visit: Payer: BC Managed Care – PPO | Admitting: Physical Therapy

## 2018-08-17 DIAGNOSIS — M542 Cervicalgia: Secondary | ICD-10-CM

## 2018-08-17 DIAGNOSIS — M25561 Pain in right knee: Secondary | ICD-10-CM

## 2018-08-17 DIAGNOSIS — M25562 Pain in left knee: Secondary | ICD-10-CM | POA: Diagnosis not present

## 2018-08-17 DIAGNOSIS — G8929 Other chronic pain: Secondary | ICD-10-CM | POA: Diagnosis not present

## 2018-08-19 ENCOUNTER — Encounter: Payer: Self-pay | Admitting: Physical Therapy

## 2018-08-19 NOTE — Therapy (Signed)
[facility] [address] [place], [place], [address] Phone: [phone]   Fax:  [phone]  Physical Therapy Treatment  Patient Details  Name: Megan May MRN: [id number] Date of Birth: 03-16-1961 Referring Provider (PT): Legrand Como Hilts// Charlann Boxer   Encounter Date: 08/17/2018  PT End of Session - 08/19/18 1336    Visit Number  5    Number of Visits  12    Date for PT Re-Evaluation  08/22/18    Authorization Type  BCBS    PT Start Time  1603    PT Stop Time  1645    PT Time Calculation (min)  42 min    Activity Tolerance  Patient tolerated treatment well    Behavior During Therapy  [facility] for tasks assessed/performed       Past Medical History:  Diagnosis Date  . Anxiety   . Asthma   . Depression    history of   . Eosinophilic esophagitis   . Fatigue 07/02/2017  . Generalized anxiety disorder   . Hyperglycemia 03/15/2016  . Hyperlipidemia, mixed 06/30/2016  . Hypersomnia, recurrent   . Hypertension   . Migraine   . Migraines   . Posttraumatic stress disorder     Past Surgical History:  Procedure Laterality Date  . CESAREAN SECTION  2004  . FRACTURE SURGERY  1973   R arm  . LAPAROTOMY Bilateral 06/14/2014   Procedure: LAPAROTOMY with right  salpingo-oophorectomy and left salpingectomy;  Surgeon: Linda Hedges, DO;  Location: [facility];  Service: Gynecology;  Laterality: Bilateral;  . TONSILLECTOMY AND ADENOIDECTOMY  1969    There were no vitals filed for this visit.  Subjective Assessment - 08/19/18 1335    Subjective  Pt states improved pain with rotation to R, and improved ability for AROM to R when laying in bed . States " i havent been able to do that in months" . States that she is doing HE for knees, and that they have not been painful.     Currently in Pain?  Yes    Pain Score  4     Pain Location  Neck    Pain Orientation  Right;Left    Pain Descriptors / Indicators  Aching;Tightness    Pain Type   Acute pain    Pain Onset  More than a month ago    Pain Frequency  Intermittent                       [facility] Adult PT Treatment/Exercise - 08/19/18 0001      Exercises   Exercises  Neck      Neck Exercises: Theraband   Rows  20 reps;Green    Shoulder External Rotation  Red;20 reps      Neck Exercises: Seated   Neck Retraction  20 reps    Cervical Rotation  10 reps      Neck Exercises: Supine   Other Supine Exercise  Shoulder horizontal Abd 2 lb x10 bil;       Neck Exercises: Prone   Other Prone Exercise  Prone T x15 bil;       Manual Therapy   Manual Therapy  Joint mobilization;Soft tissue mobilization;Passive ROM;Manual Traction    Manual therapy comments  skilled palpation andmonitoring of soft tissue with dry needling.     Joint Mobilization  PA mobs for c-spine    Soft tissue mobilization  STM to bil UT, paraspinals    Passive  ROM  manual stretching for Bil UTs.    Manual Traction  10 sec x10      Neck Exercises: Stretches   Upper Trapezius Stretch  3 reps;30 seconds    Other Neck Stretches  posterior shoulder stretch 30 sec x3 on R;        Trigger Point Dry Needling - 08/19/18 1335    Consent Given?  Yes    Muscles Treated Upper Body  Upper trapezius;Levator scapulae    Upper Trapezius Response  Twitch reponse elicited;Palpable increased muscle length    Levator Scapulae Response  Palpable increased muscle length;Twitch response elicited             PT Short Term Goals - 08/09/18 1412      PT SHORT TERM GOAL #1   Title  Pt to be independent with initial HEP for neck and knees.    Time  2    Period  Weeks    Status  New    Target Date  08/18/18        PT Long Term Goals - 08/09/18 1412      PT LONG TERM GOAL #1   Title  Pt to be independent with long term HEP for Strengthening of Bil LEs , and for posture/neck pain.     Time  6    Period  Weeks    Status  Revised    Target Date  09/15/18      PT LONG TERM GOAL #2   Title  Pt  to report decreased pain in L and R knee, to 0-2/10 with activity     Time  6    Period  Weeks    Status  On-going    Target Date  09/15/18      PT LONG TERM GOAL #3   Title  Pt to demo increased strength of Bil hips and knees, to at least 4+/5 to improve stability and pain.     Time  6    Period  Weeks    Status  On-going    Target Date  09/15/18      PT LONG TERM GOAL #4   Title  Pt to report decreased pain in neck, to 0-2/10 with activity and work duties.     Time  6    Period  Weeks    Status  New    Target Date  09/15/18      PT LONG TERM GOAL #5   Title  Pt to demo soft tissue restrictions in neck and UT region, to be [facility], to improve pain and movemnt.     Time  6    Period  Weeks    Status  New    Target Date  09/15/18            Plan - 08/19/18 1337    Clinical Impression Statement  Pt with trigger points in bil UT region today, addressed with DN, and DTM. Pt with improving pain withmovement. Continues to have ROM limitation with R rotation. Plan to progress postural strength exercises as tolerated.     Rehab Potential  Good    PT Frequency  2x / week    PT Duration  6 weeks    PT Treatment/Interventions  ADLs/Self Care Home Management;Cryotherapy;Electrical Stimulation;Iontophoresis 4mg /ml Dexamethasone;Moist Heat;Therapeutic activities;Functional mobility training;Stair training;Gait training;DME Instruction;Ultrasound;Therapeutic exercise;Balance training;Neuromuscular re-education;Patient/family education;Orthotic Fit/Training;Dry needling;Passive range of motion;Manual techniques;Taping;Joint Manipulations    Consulted and Agree with Plan of Care  Patient  Patient will benefit from skilled therapeutic intervention in order to improve the following deficits and impairments:  Abnormal gait, Decreased endurance, Decreased activity tolerance, Decreased strength, Pain, Difficulty walking, Decreased mobility, Decreased balance, Improper body mechanics, Decreased  range of motion, Increased muscle spasms, Hypomobility, Impaired flexibility  Visit Diagnosis: Cervicalgia  Chronic pain of right knee  Acute pain of left knee     Problem List Patient Active Problem List   Diagnosis Date Noted  . Patellofemoral arthritis of right knee 03/25/2018  . Chronic pain of right knee 03/16/2018  . Injury of cervical spine ([place]) 03/16/2018  . Fatigue 07/02/2017  . Hyperlipidemia, mixed 06/30/2016  . Hyperglycemia 03/15/2016  . Atypical chest pain 01/17/2016  . RUQ pain 01/17/2016  . Right hip pain 03/24/2015  . Leukocytopenia 03/24/2015  . Bursitis of left shoulder 03/22/2015  . Greater trochanteric bursitis of right hip 03/22/2015  . S/P laparotomy 06/14/2014  . Diverticulosis of colon without hemorrhage  colonoscopy 2015 05/27/2014  . Colon polyp 05/02/2014  . Microscopic hematuria work up urology  see 01/2014 note 04/22/2014  . Hypersomnia, recurrent   . Dysarthria 05/01/2013  . Mastodynia 10/13/2012  . Mass of right breast 10/13/2012  . Essential hypertension, benign 06/29/2011  . Migraines 06/29/2011  . Anxiety 06/29/2011  . Eosinophilic esophagitis 34/91/7915  . Lupus anticoagulant positive 06/29/2011    Lyndee Hensen, PT, DPT 1:38 PM  08/19/18    [facility] [place], [place], [address] Phone: [phone]   Fax:  [phone]  Name: RITISHA DEITRICK MRN: [id number] Date of Birth: [birth year]

## 2018-08-29 ENCOUNTER — Encounter: Payer: Self-pay | Admitting: Cardiology

## 2018-08-29 ENCOUNTER — Ambulatory Visit: Payer: BC Managed Care – PPO | Admitting: Cardiology

## 2018-08-29 VITALS — BP 122/72 | HR 54 | Ht 67.0 in | Wt 189.0 lb

## 2018-08-29 DIAGNOSIS — I10 Essential (primary) hypertension: Secondary | ICD-10-CM | POA: Diagnosis not present

## 2018-08-29 MED ORDER — LISINOPRIL 40 MG PO TABS: ORAL_TABLET | ORAL | 3 refills | Status: DC

## 2018-08-29 MED ORDER — FUROSEMIDE 20 MG PO TABS: ORAL_TABLET | ORAL | 2 refills | Status: DC

## 2018-08-29 NOTE — Patient Instructions (Signed)
Medication Instructions:  Your physician recommends that you continue on your current medications as directed. Please refer to the Current Medication list given to you today. If you need a refill on your cardiac medications before your next appointment, please call your pharmacy.   Lab work: None  If you have labs (blood work) drawn today and your tests are completely normal, you will receive your results only by: Marland Kitchen MyChart Message (if you have MyChart) OR . A paper copy in the mail If you have any lab test that is abnormal or we need to change your treatment, we will call you to review the results.  Testing/Procedures: None   Follow-Up: At [facility], you and your health needs are our priority.  As part of our continuing mission to provide you with exceptional heart care, we have created designated Provider Care Teams.  These Care Teams include your primary Cardiologist (physician) and Advanced Practice Providers (APPs -  Physician Assistants and Nurse Practitioners) who all work together to provide you with the care you need, when you need it. You will need a follow up appointment in 12 months.  Please call our office 2 months in advance to schedule this appointment.  You may see Dr Skeet Latch or one of the following Advanced Practice Providers on your designated Care Team:   Kerin Ransom, PA-C Roby Lofts, [place] . Sande Rives, PA-C  Any Other Special Instructions Will Be Listed Below (If Applicable).

## 2018-08-29 NOTE — Assessment & Plan Note (Signed)
Seen today for routine follow up

## 2018-08-29 NOTE — Progress Notes (Signed)
08/29/2018 Megan May   [birth year]  [id number]  Primary Physician Mosie Lukes, MD Primary Cardiologist: Dr Oval Linsey  HPI:  58 y.o. female with hypertension, eosinophilic esophagitis, depression and anxiety  who presents for follow up.   She was first seen on 02/2016 for chest discomfort.  She was referred for ETT.  However when she got there her blood pressure was too high so it was rescheduled .  She was started on lisinopril 20 mg daily and HCTZ.  She had and echo 03/09/16 that revealed LVEF 65-70% and was otherwise unremarkable.  She underwent ETT 05/2016 that was negative for ischemia.  She achieved 11.7 METs on a Bruce protocol.  She has had a couple of episodes where her B/P spiked.  The last was in Nov 2019 and she went to the ED.  These episodes are associated with stressful situations.    Current Outpatient Medications  Medication Sig Dispense Refill  . albuterol (PROVENTIL HFA;VENTOLIN HFA) 108 (90 Base) MCG/ACT inhaler Inhale 1-2 puffs into the lungs every 6 (six) hours as needed for wheezing or shortness of breath. 8 g 0  . ALPRAZolam (XANAX) 0.5 MG tablet Take 0.5 mg by mouth 2 (two) times daily as needed for anxiety.     Marland Kitchen dexlansoprazole (DEXILANT) 60 MG capsule Take 60 mg by mouth daily.     . furosemide (LASIX) 20 MG tablet 1 TABLET BY MOUTH DAILY AS NEEDED 90 tablet 2  . lisinopril (PRINIVIL,ZESTRIL) 40 MG tablet 1 tablet by mouth now and then 1 tablet daily 90 tablet 3   No current facility-administered medications for this visit.     Allergies  Allergen Reactions  . Amoxicillin-Pot Clavulanate Rash  . Levaquin [Levofloxacin In D5w] Other (See Comments)    tendonopathy  . Clindamycin Itching  . Clindamycin/Lincomycin Itching  . Lactose Intolerance (Gi) Nausea Only  . Metoprolol     Weight gain/fluid retention   . Penicillin G Other (See Comments)  . Aspirin Other (See Comments)    Migraine headache  . Latex Rash  . Penicillins Swelling and Rash      Past Medical History:  Diagnosis Date  . Anxiety   . Asthma   . Depression    history of   . Eosinophilic esophagitis   . Fatigue 07/02/2017  . Generalized anxiety disorder   . Hyperglycemia 03/15/2016  . Hyperlipidemia, mixed 06/30/2016  . Hypersomnia, recurrent   . Hypertension   . Migraine   . Migraines   . Posttraumatic stress disorder     Social History   Socioeconomic History  . Marital status: Married    Spouse name: Randall Hiss  . Number of children: 1  . Years of education: 2 Masters  . Highest education level: Not on file  Occupational History  . Occupation: SPECIAL EDUCATOR    Employer: [employer]    Comment: Teacher, early years/pre  Social Needs  . Financial resource strain: Not on file  . Food insecurity:    Worry: Not on file    Inability: Not on file  . Transportation needs:    Medical: Not on file    Non-medical: Not on file  Tobacco Use  . Smoking status: Never Smoker  . Smokeless tobacco: Never Used  Substance and Sexual Activity  . Alcohol use: No  . Drug use: No  . Sexual activity: Yes    Birth control/protection: Surgical  Lifestyle  . Physical activity:    Days per week: Not on file  Minutes per session: Not on file  . Stress: Not on file  Relationships  . Social connections:    Talks on phone: Not on file    Gets together: Not on file    Attends religious service: Not on file    Active member of club or organization: Not on file    Attends meetings of clubs or organizations: Not on file    Relationship status: Not on file  . Intimate partner violence:    Fear of current or ex partner: Not on file    Emotionally abused: Not on file    Physically abused: Not on file    Forced sexual activity: Not on file  Other Topics Concern  . Not on file  Social History Narrative   Patient is right handed, resides in home with husband and daughter. She consumes 16 oz of tea daily.   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      Family History  Problem Relation Age of Onset  . Hypertension Father   . Heart disease Father   . Diabetes Father   . CAD Father   . Stroke Maternal Grandfather   . Stroke Maternal Grandmother   . Diabetes Paternal Grandmother   . Other Cousin        bicuspid aortic valve  . Diabetes Brother      Review of Systems: General: negative for chills, fever, night sweats or weight changes.  Cardiovascular: negative for chest pain, dyspnea on exertion, edema, orthopnea, palpitations, paroxysmal nocturnal dyspnea or shortness of breath Dermatological: negative for rash Respiratory: negative for cough or wheezing Urologic: negative for hematuria Abdominal: negative for nausea, vomiting, diarrhea, bright red blood per rectum, melena, or hematemesis Neurologic: negative for visual changes, syncope, or dizziness All other systems reviewed and are otherwise negative except as noted above.    Blood pressure 122/72, pulse (!) 54, height 5\' 7"  (1.702 m), weight 189 lb (85.7 kg), last menstrual period 05/10/2014.  General appearance: alert, cooperative and no distress Neck: no carotid bruit and no JVD Lungs: clear to auscultation bilaterally Heart: regular rate and rhythm Extremities: no edema Skin: Skin color, texture, turgor normal. No rashes or lesions Neurologic: Grossly normal  EKG NSR, SB-54  ASSESSMENT AND PLAN:   Essential hypertension, benign Seen today for routine follow up   PLAN  Renal function was normal Nov 2019- renew her Lisinopril and Lasix- f/u in one year.   Kerin Ransom PA-C 08/29/2018 4:43 PM

## 2018-08-30 ENCOUNTER — Ambulatory Visit: Payer: BC Managed Care – PPO | Admitting: Physical Therapy

## 2018-08-30 DIAGNOSIS — M542 Cervicalgia: Secondary | ICD-10-CM

## 2018-08-30 DIAGNOSIS — M25561 Pain in right knee: Secondary | ICD-10-CM

## 2018-08-30 DIAGNOSIS — M25562 Pain in left knee: Secondary | ICD-10-CM | POA: Diagnosis not present

## 2018-08-30 DIAGNOSIS — G8929 Other chronic pain: Secondary | ICD-10-CM

## 2018-08-31 ENCOUNTER — Encounter: Payer: Self-pay | Admitting: Physical Therapy

## 2018-08-31 NOTE — Therapy (Signed)
Megan May [address] [name], [place], [address] Phone: [phone]   Fax:  [phone]  Physical Therapy Treatment/ReCert   Patient Details  Name: Megan May MRN: [id number] Date of Birth: January 19, 1961 Referring Provider (PT): Legrand Como Hilts// Charlann Boxer   Encounter Date: 08/30/2018  PT End of Session - 08/31/18 0843    Visit Number  6    Number of Visits  12    Date for PT Re-Evaluation  10/11/18    Authorization Type  BCBS    PT Start Time  1605    PT Stop Time  1645    PT Time Calculation (min)  40 min    Activity Tolerance  Patient tolerated treatment well    Behavior During Therapy  [facility] for tasks assessed/performed       Past Medical History:  Diagnosis Date  . Anxiety   . Asthma   . Depression    history of   . Eosinophilic esophagitis   . Fatigue 07/02/2017  . Generalized anxiety disorder   . Hyperglycemia 03/15/2016  . Hyperlipidemia, mixed 06/30/2016  . Hypersomnia, recurrent   . Hypertension   . Migraine   . Migraines   . Posttraumatic stress disorder     Past Surgical History:  Procedure Laterality Date  . CESAREAN SECTION  2004  . FRACTURE SURGERY  1973   R arm  . LAPAROTOMY Bilateral 06/14/2014   Procedure: LAPAROTOMY with right  salpingo-oophorectomy and left salpingectomy;  Surgeon: Linda Hedges, DO;  Location: [facility];  Service: Gynecology;  Laterality: Bilateral;  . TONSILLECTOMY AND ADENOIDECTOMY  1969    There were no vitals filed for this visit.  Subjective Assessment - 08/31/18 0842    Subjective  Pt states improving pain on R, but has pain on L, and entire neck is "still sore" She states no pain in knees at this time.     Currently in Pain?  Yes    Pain Score  4     Pain Location  Neck    Pain Orientation  Right;Left    Pain Descriptors / Indicators  Aching;Tightness    Pain Type  Acute pain    Pain Onset  More than a month ago    Pain Frequency  Intermittent    Pain  Score  0    Pain Location  Knee    Pain Orientation  Left;Right         [facility] PT Assessment - 08/31/18 0001      AROM   Overall AROM Comments  Mild limitation for R cervical rotation; Mild limitation for cervical extension      Strength   Overall Strength Comments  UE: 4/5, scapular: 4/5        Palpation   Palpation comment  Soreness and tigtness in Bil UT, decreased from previous sessions.                    [facility] Adult PT Treatment/Exercise - 08/31/18 1044      Exercises   Exercises  Neck      Neck Exercises: Theraband   Rows  20 reps;Green    Shoulder External Rotation  Red;20 reps      Neck Exercises: Seated   Neck Retraction  20 reps    Cervical Rotation  --      Neck Exercises: Supine   Other Supine Exercise  Shoulder horizontal Abd 2 lb x10 bil;       Neck Exercises:  Prone   Other Prone Exercise  Prone T x15 bil;       Modalities   Modalities  Moist Heat      Manual Therapy   Manual Therapy  Joint mobilization;Soft tissue mobilization;Passive ROM;Manual Traction    Manual therapy comments  skilled palpation andmonitoring of soft tissue with dry needling.     Joint Mobilization  --    Soft tissue mobilization  STM to bil UT, paraspinals    Passive ROM  manual stretching for Bil UTs, sub occipital release     Manual Traction  10 sec x5      Neck Exercises: Stretches   Upper Trapezius Stretch  --    Other Neck Stretches  --       Trigger Point Dry Needling - 08/31/18 1045    Consent Given?  Yes    Muscles Treated Upper Body  Upper trapezius    Upper Trapezius Response  Twitch reponse elicited;Palpable increased muscle length   Bilateral            PT Short Term Goals - 08/31/18 0844      PT SHORT TERM GOAL #1   Title  Pt to be independent with initial HEP for neck and knees.    Time  2    Period  Weeks    Status  Achieved        PT Long Term Goals - 08/31/18 0844      PT LONG TERM GOAL #1   Title  Pt to be independent  with long term HEP for Strengthening of Bil LEs , and for posture/neck pain.     Time  6    Period  Weeks    Status  On-going    Target Date  10/11/18      PT LONG TERM GOAL #2   Title  Pt to report decreased pain in L and R knee, to 0-2/10 with activity     Time  6    Period  Weeks    Status  Achieved      PT LONG TERM GOAL #3   Title  Pt to demo increased strength of Bil hips and knees, to at least 4+/5 to improve stability and pain.     Time  6    Period  Weeks    Status  Partially Met    Target Date  10/11/18      PT LONG TERM GOAL #4   Title  Pt to report decreased pain in neck, to 0-2/10 with activity and work duties.     Time  6    Period  Weeks    Status  On-going    Target Date  10/11/18      PT LONG TERM GOAL #5   Title  Pt to demo soft tissue restrictions in neck and UT region, to be [facility], to improve pain and movemnt.     Time  6    Period  Weeks    Status  On-going    Target Date  10/11/18            Plan - 08/31/18 1048    Clinical Impression Statement  Pt has been seen for 6 visits, 2 with main focus for knee pain, and 4 for focus of neck pain. Re-Cert done today for continued care. Pt has only been seen for 4 visits, has low availability for scheduling, 1x/wk. She states knee pain resolved. Wil review final HEP for LEs, but  main focus will continue to be for neck. She also has increased soreness today in R shoulder, with elevation. Educated on posture, movement and strengthening for ER today. Pt with mild improvments with cervical ROM, still has mild limitation for R rotation and extension. She has improving tightness in bil UTs, but still has main complaint of soreness in this area.  Dry needling and manual therapy helping to decrease trigger points. and pain. Pt to benefit from continuation of skilled care, to improve pain and deficits, and meet LTGs.     Rehab Potential  Good    PT Frequency  2x / week    PT Duration  6 weeks    PT Treatment/Interventions   ADLs/Self Care Home Management;Cryotherapy;Electrical Stimulation;Iontophoresis 59m/ml Dexamethasone;Moist Heat;Therapeutic activities;Functional mobility training;Stair training;Gait training;DME Instruction;Ultrasound;Therapeutic exercise;Balance training;Neuromuscular re-education;Patient/family education;Orthotic Fit/Training;Dry needling;Passive range of motion;Manual techniques;Taping;Joint Manipulations    Consulted and Agree with Plan of Care  Patient       Patient will benefit from skilled therapeutic intervention in order to improve the following deficits and impairments:  Abnormal gait, Decreased endurance, Decreased activity tolerance, Decreased strength, Pain, Difficulty walking, Decreased mobility, Decreased balance, Improper body mechanics, Decreased range of motion, Increased muscle spasms, Hypomobility, Impaired flexibility  Visit Diagnosis: Cervicalgia  Chronic pain of right knee  Acute pain of left knee     Problem List Patient Active Problem List   Diagnosis Date Noted  . Patellofemoral arthritis of right knee 03/25/2018  . Chronic pain of right knee 03/16/2018  . Injury of cervical spine ([place] 03/16/2018  . Fatigue 07/02/2017  . Hyperlipidemia, mixed 06/30/2016  . Hyperglycemia 03/15/2016  . Atypical chest pain 01/17/2016  . RUQ pain 01/17/2016  . Right hip pain 03/24/2015  . Leukocytopenia 03/24/2015  . Bursitis of left shoulder 03/22/2015  . Greater trochanteric bursitis of right hip 03/22/2015  . S/P laparotomy 06/14/2014  . Diverticulosis of colon without hemorrhage  colonoscopy 2015 05/27/2014  . Colon polyp 05/02/2014  . Microscopic hematuria work up urology  see 01/2014 note 04/22/2014  . Hypersomnia, recurrent   . Dysarthria 05/01/2013  . Mastodynia 10/13/2012  . Mass of right breast 10/13/2012  . Essential hypertension, benign 06/29/2011  . Migraines 06/29/2011  . Anxiety 06/29/2011  . Eosinophilic esophagitis 154/00/8676 . Lupus anticoagulant  positive 06/29/2011   LLyndee Hensen PT, DPT 10:55 AM  08/31/18    [facility] [address] [address]Phone: [phone]  Fax:  [phone] Name: KBRAELYNNE GARINGERMRN: [id number] of Birth: 5June 08, 1962

## 2018-09-06 ENCOUNTER — Ambulatory Visit: Payer: BC Managed Care – PPO | Admitting: Physical Therapy

## 2018-09-06 DIAGNOSIS — M25562 Pain in left knee: Secondary | ICD-10-CM

## 2018-09-06 DIAGNOSIS — M25561 Pain in right knee: Secondary | ICD-10-CM | POA: Diagnosis not present

## 2018-09-06 DIAGNOSIS — G8929 Other chronic pain: Secondary | ICD-10-CM

## 2018-09-06 DIAGNOSIS — M542 Cervicalgia: Secondary | ICD-10-CM | POA: Diagnosis not present

## 2018-09-06 NOTE — Patient Instructions (Signed)
Access Code: [email or web address]  URL: [email or web address]  Date: 09/06/2018  Prepared by: Lyndee Hensen   Exercises  Seated Cervical Sidebending Stretch - 3 reps - 30 hold - 3x daily  Neck Rotation - 10 reps - 1 sets - 2x daily  Seated Cervical Retraction - 10 reps - 2 sets - 3 hold - 2x daily  Seated Scapular Retraction - 10 reps - 2 sets - 3 hold - 2x daily  Standing Backward Shoulder Rolls - 10 reps - 1 sets - 2x daily  Straight Leg Raise - 10 reps - 2 sets - 1x daily  Sidelying Hip Abduction - 10 reps - 2 sets - 1x daily  Seated Long Arc Quad - 10 reps - 2 sets - 2x daily  Supine Quad Set - 10 reps - 2 sets - 2x daily  Seated Hamstring Stretch - 3 reps - 30 hold - 2x daily  Standing Row with Resistance - 10 reps - 2 sets - 1x daily  Shoulder External Rotation with Anchored Resistance - 10 reps - 2 sets - 1x daily  Prone Scapular Retraction Arms at Side - 10 reps - 2 sets - 1x daily

## 2018-09-08 ENCOUNTER — Encounter: Payer: Self-pay | Admitting: Physical Therapy

## 2018-09-08 NOTE — Therapy (Addendum)
[facility] [address] [place], [place], [address] Phone: [phone]   Fax:  [phone]  Physical Therapy Treatment  Patient Details  Name: Megan May MRN: [id number] Date of Birth: 1960/11/02 Referring Provider (PT): Legrand Como Hilts// Charlann Boxer   Encounter Date: 09/06/2018  PT End of Session - 09/08/18 1146    Visit Number  7    Number of Visits  12    Date for PT Re-Evaluation  10/11/18    Authorization Type  BCBS    PT Start Time  1602    PT Stop Time  1645    PT Time Calculation (min)  43 min    Activity Tolerance  Patient tolerated treatment well    Behavior During Therapy  [facility] for tasks assessed/performed       Past Medical History:  Diagnosis Date  . Anxiety   . Asthma   . Depression    history of   . Eosinophilic esophagitis   . Fatigue 07/02/2017  . Generalized anxiety disorder   . Hyperglycemia 03/15/2016  . Hyperlipidemia, mixed 06/30/2016  . Hypersomnia, recurrent   . Hypertension   . Migraine   . Migraines   . Posttraumatic stress disorder     Past Surgical History:  Procedure Laterality Date  . CESAREAN SECTION  2004  . FRACTURE SURGERY  1973   R arm  . LAPAROTOMY Bilateral 06/14/2014   Procedure: LAPAROTOMY with right  salpingo-oophorectomy and left salpingectomy;  Surgeon: Linda Hedges, DO;  Location: [facility];  Service: Gynecology;  Laterality: Bilateral;  . TONSILLECTOMY AND ADENOIDECTOMY  1969    There were no vitals filed for this visit.  Subjective Assessment - 09/08/18 1145    Subjective  Pt states improving neck pain.     Currently in Pain?  Yes    Pain Score  2     Pain Location  Neck    Pain Orientation  Left;Right    Pain Descriptors / Indicators  Aching;Tightness    Pain Type  Acute pain    Pain Onset  More than a month ago    Pain Frequency  Intermittent                       [facility] Adult PT Treatment/Exercise - 09/08/18 0001      Exercises   Exercises  Neck      Neck Exercises: Theraband   Rows  20 reps;Green    Shoulder External Rotation  Red;20 reps      Neck Exercises: Standing   Other Standing Exercises  AROM for scaption x15; Abd x15;       Neck Exercises: Supine   Other Supine Exercise  Shoulder horizontal Abd 2 lb x10 bil;       Neck Exercises: Prone   Other Prone Exercise  Prone T x15 bil; Prone Ext x15 bil;       Modalities   Modalities  Moist Heat      Manual Therapy   Manual Therapy  Joint mobilization;Soft tissue mobilization;Passive ROM;Manual Traction    Joint Mobilization  PA mobs for c-spine, mobs to increase R rotation,     Passive ROM  manual stretching for Bil UTs, sub occipital release , PROM for R rotation.     Manual Traction  10 sec x8      Neck Exercises: Stretches   Upper Trapezius Stretch  3 reps;30 seconds  PT Education - 09/08/18 1145    Education Details  HEP updated, to include more strengthening for UE    Person(s) Educated  Patient    Methods  Explanation;Handout    Comprehension  Verbalized understanding       PT Short Term Goals - 08/31/18 0844      PT SHORT TERM GOAL #1   Title  Pt to be independent with initial HEP for neck and knees.    Time  2    Period  Weeks    Status  Achieved        PT Long Term Goals - 08/31/18 0844      PT LONG TERM GOAL #1   Title  Pt to be independent with long term HEP for Strengthening of Bil LEs , and for posture/neck pain.     Time  6    Period  Weeks    Status  On-going    Target Date  10/11/18      PT LONG TERM GOAL #2   Title  Pt to report decreased pain in L and R knee, to 0-2/10 with activity     Time  6    Period  Weeks    Status  Achieved      PT LONG TERM GOAL #3   Title  Pt to demo increased strength of Bil hips and knees, to at least 4+/5 to improve stability and pain.     Time  6    Period  Weeks    Status  Partially Met    Target Date  10/11/18      PT LONG TERM GOAL #4   Title  Pt to  report decreased pain in neck, to 0-2/10 with activity and work duties.     Time  6    Period  Weeks    Status  On-going    Target Date  10/11/18      PT LONG TERM GOAL #5   Title  Pt to demo soft tissue restrictions in neck and UT region, to be [facility], to improve pain and movemnt.     Time  6    Period  Weeks    Status  On-going    Target Date  10/11/18            Plan - 09/08/18 1146    Clinical Impression Statement  Pt with improving pain with cervical ROM. She is limited with R rotation, but is pain free with motion. HEp updated today. Ther ex progressed for postural stengthening. Pt challenged with this and requires cuing for mechanics of shoulder posture and neck. Pt progressing well. Plan to work towards d/c in next 2 weeks if pain continues to improve.     Rehab Potential  Good    PT Frequency  2x / week    PT Duration  6 weeks    PT Treatment/Interventions  ADLs/Self Care Home Management;Cryotherapy;Electrical Stimulation;Iontophoresis 60m/ml Dexamethasone;Moist Heat;Therapeutic activities;Functional mobility training;Stair training;Gait training;DME Instruction;Ultrasound;Therapeutic exercise;Balance training;Neuromuscular re-education;Patient/family education;Orthotic Fit/Training;Dry needling;Passive range of motion;Manual techniques;Taping;Joint Manipulations    Consulted and Agree with Plan of Care  Patient       Patient will benefit from skilled therapeutic intervention in order to improve the following deficits and impairments:  Abnormal gait, Decreased endurance, Decreased activity tolerance, Decreased strength, Pain, Difficulty walking, Decreased mobility, Decreased balance, Improper body mechanics, Decreased range of motion, Increased muscle spasms, Hypomobility, Impaired flexibility  Visit Diagnosis: Cervicalgia  Chronic pain of right knee  Acute pain of left knee     Problem List Patient Active Problem List   Diagnosis Date Noted  . Patellofemoral  arthritis of right knee 03/25/2018  . Chronic pain of right knee 03/16/2018  . Injury of cervical spine ([place]) 03/16/2018  . Fatigue 07/02/2017  . Hyperlipidemia, mixed 06/30/2016  . Hyperglycemia 03/15/2016  . Atypical chest pain 01/17/2016  . RUQ pain 01/17/2016  . Right hip pain 03/24/2015  . Leukocytopenia 03/24/2015  . Bursitis of left shoulder 03/22/2015  . Greater trochanteric bursitis of right hip 03/22/2015  . S/P laparotomy 06/14/2014  . Diverticulosis of colon without hemorrhage  colonoscopy 2015 05/27/2014  . Colon polyp 05/02/2014  . Microscopic hematuria work up urology  see 01/2014 note 04/22/2014  . Hypersomnia, recurrent   . Dysarthria 05/01/2013  . Mastodynia 10/13/2012  . Mass of right breast 10/13/2012  . Essential hypertension, benign 06/29/2011  . Migraines 06/29/2011  . Anxiety 06/29/2011  . Eosinophilic esophagitis 15/72/6203  . Lupus anticoagulant positive 06/29/2011    Lyndee Hensen, PT, DPT 11:48 AM  09/08/18    [facility] [place], [place], [address] Phone: [phone]   Fax:  [phone]  Name: Megan May MRN: [id number] Date of Birth: 13-Jan-1961     PHYSICAL THERAPY DISCHARGE SUMMARY Visits since start of care:  7  Plan: Patient agrees to discharge.  Patient goals were partially met. Patient is being discharged due to not returning since the last visit.  ?????     Pt did not return for last appt, pt was doing significantly better, has met most goals.   Lyndee Hensen, PT, DPT 9:17 AM  10/17/18

## 2018-09-14 ENCOUNTER — Encounter: Payer: BC Managed Care – PPO | Admitting: Physical Therapy

## 2018-10-17 ENCOUNTER — Ambulatory Visit: Payer: BC Managed Care – PPO | Admitting: Family Medicine

## 2018-10-26 ENCOUNTER — Encounter: Payer: Self-pay | Admitting: Family Medicine

## 2018-10-27 MED ORDER — ALBUTEROL SULFATE HFA 108 (90 BASE) MCG/ACT IN AERS: 1.0000 | INHALATION_SPRAY | Freq: Four times a day (QID) | RESPIRATORY_TRACT | 5 refills | Status: DC | PRN

## 2018-10-28 ENCOUNTER — Telehealth: Payer: Self-pay | Admitting: Family Medicine

## 2018-10-28 NOTE — Telephone Encounter (Signed)
Patient called and said that she was prescribed an Albuterol inhaler and she thinks its not working for her very well.  Patient said that she feels like she is not getting enough air in.  She report no fever, no cough.   She wants to know is there something else she can be prescibed

## 2018-10-31 NOTE — Telephone Encounter (Signed)
Please advise 

## 2018-10-31 NOTE — Telephone Encounter (Signed)
Make sure she knows to use 1-2 puffs every 4-6 hours as needed and add Qvar 80 mcg, sig: 2 puffs po bid should help the inflammation in her lungs. Disp #1. Then set her up on a Webex if she would like.

## 2018-11-01 MED ORDER — BECLOMETHASONE DIPROP HFA 80 MCG/ACT IN AERB: 2.0000 | INHALATION_SPRAY | Freq: Two times a day (BID) | RESPIRATORY_TRACT | 0 refills | Status: DC

## 2018-11-01 NOTE — Telephone Encounter (Signed)
Called patients several times to let her know that we have sent in the new rx   Patients voicemail will not allow messages

## 2018-11-01 NOTE — Telephone Encounter (Signed)
Need for patient to schedule a webex visit

## 2018-11-02 NOTE — Telephone Encounter (Signed)
See 10/28/18 phone note.

## 2018-11-03 NOTE — Telephone Encounter (Signed)
Spoke with patient about her symptoms. I offered Webex visit patient stated she feels she is better and does not want the visit at this time.

## 2018-12-23 ENCOUNTER — Ambulatory Visit (INDEPENDENT_AMBULATORY_CARE_PROVIDER_SITE_OTHER): Payer: BC Managed Care – PPO | Admitting: Family Medicine

## 2018-12-23 ENCOUNTER — Other Ambulatory Visit: Payer: Self-pay

## 2018-12-23 DIAGNOSIS — R739 Hyperglycemia, unspecified: Secondary | ICD-10-CM

## 2018-12-23 DIAGNOSIS — G43809 Other migraine, not intractable, without status migrainosus: Secondary | ICD-10-CM | POA: Diagnosis not present

## 2018-12-23 DIAGNOSIS — E782 Mixed hyperlipidemia: Secondary | ICD-10-CM

## 2018-12-23 DIAGNOSIS — I10 Essential (primary) hypertension: Secondary | ICD-10-CM | POA: Diagnosis not present

## 2018-12-26 NOTE — Assessment & Plan Note (Signed)
Encouraged heart healthy diet, increase exercise, avoid trans fats, consider a krill oil cap daily 

## 2018-12-26 NOTE — Assessment & Plan Note (Signed)
Mildly elevated. She will monitor weekly BP and if any concerning symptoms.  no changes to meds. Encouraged heart healthy diet such as the DASH diet and exercise as tolerated.

## 2018-12-26 NOTE — Progress Notes (Signed)
Virtual Visit via Video Note  I connected with Megan May on 12/26/18 at  9:40 AM EDT by a video enabled telemedicine application and verified that I am speaking with the correct person using two identifiers.  Location: Patient: home Provider: home   I discussed the limitations of evaluation and management by telemedicine and the availability of in person appointments. The patient expressed understanding and agreed to proceed.    Subjective:    Patient ID: Megan May, female    DOB: Aug 09, 1960, 58 y.o.   MRN: [id number]  No chief complaint on file.   HPI Patient is in today for follow up on hypertension, anxiety, hyperlipidemia. She feels well. No recent febrile illness or hospitalizations but her blood pressure has been elevated recently. She has recently undergone some physical therapy for her neck pain and that has been helpful. Denies CP/palp/SOB/HA/congestion/fevers/GI or GU c/o. Taking meds as prescribed  Past Medical History:  Diagnosis Date  . Anxiety   . Asthma   . Depression    history of   . Eosinophilic esophagitis   . Fatigue 07/02/2017  . Generalized anxiety disorder   . Hyperglycemia 03/15/2016  . Hyperlipidemia, mixed 06/30/2016  . Hypersomnia, recurrent   . Hypertension   . Migraine   . Migraines   . Posttraumatic stress disorder     Past Surgical History:  Procedure Laterality Date  . CESAREAN SECTION  2004  . FRACTURE SURGERY  1973   R arm  . LAPAROTOMY Bilateral 06/14/2014   Procedure: LAPAROTOMY with right  salpingo-oophorectomy and left salpingectomy;  Surgeon: Linda Hedges, DO;  Location: [facility];  Service: Gynecology;  Laterality: Bilateral;  . TONSILLECTOMY AND ADENOIDECTOMY  1969    Family History  Problem Relation Age of Onset  . Hypertension Father   . Heart disease Father   . Diabetes Father   . CAD Father   . Stroke Maternal Grandfather   . Stroke Maternal Grandmother   . Diabetes Paternal Grandmother   .  Other Cousin        bicuspid aortic valve  . Diabetes Brother     Social History   Socioeconomic History  . Marital status: Married    Spouse name: Randall Hiss  . Number of children: 1  . Years of education: 2 Masters  . Highest education level: Not on file  Occupational History  . Occupation: SPECIAL EDUCATOR    Employer: [employer]    Comment: Teacher, early years/pre  Social Needs  . Financial resource strain: Not on file  . Food insecurity:    Worry: Not on file    Inability: Not on file  . Transportation needs:    Medical: Not on file    Non-medical: Not on file  Tobacco Use  . Smoking status: Never Smoker  . Smokeless tobacco: Never Used  Substance and Sexual Activity  . Alcohol use: No  . Drug use: No  . Sexual activity: Yes    Birth control/protection: Surgical  Lifestyle  . Physical activity:    Days per week: Not on file    Minutes per session: Not on file  . Stress: Not on file  Relationships  . Social connections:    Talks on phone: Not on file    Gets together: Not on file    Attends religious service: Not on file    Active member of club or organization: Not on file    Attends meetings of clubs or organizations: Not on file  Relationship status: Not on file  . Intimate partner violence:    Fear of current or ex partner: Not on file    Emotionally abused: Not on file    Physically abused: Not on file    Forced sexual activity: Not on file  Other Topics Concern  . Not on file  Social History Narrative   Patient is right handed, resides in home with husband and daughter. She consumes 16 oz of tea daily.   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    Outpatient Medications Prior to Visit  Medication Sig Dispense Refill  . albuterol (PROVENTIL HFA;VENTOLIN HFA) 108 (90 Base) MCG/ACT inhaler Inhale 1-2 puffs into the lungs every 6 (six) hours as needed for wheezing or shortness of breath. 18 g 5  . ALPRAZolam (XANAX) 0.5 MG tablet Take  0.5 mg by mouth 2 (two) times daily as needed for anxiety.     . beclomethasone (QVAR REDIHALER) 80 MCG/ACT inhaler Inhale 2 puffs into the lungs 2 (two) times daily. 1 Inhaler 0  . dexlansoprazole (DEXILANT) 60 MG capsule Take 60 mg by mouth daily.     . furosemide (LASIX) 20 MG tablet 1 TABLET BY MOUTH DAILY AS NEEDED 90 tablet 2  . lisinopril (PRINIVIL,ZESTRIL) 40 MG tablet 1 tablet by mouth now and then 1 tablet daily 90 tablet 3   No facility-administered medications prior to visit.     Allergies  Allergen Reactions  . Amoxicillin-Pot Clavulanate Rash  . Levaquin [Levofloxacin In D5w] Other (See Comments)    tendonopathy  . Clindamycin Itching  . Clindamycin/Lincomycin Itching  . Lactose Intolerance (Gi) Nausea Only  . Metoprolol     Weight gain/fluid retention   . Penicillin G Other (See Comments)  . Aspirin Other (See Comments)    Migraine headache  . Latex Rash  . Penicillins Swelling and Rash    ROS     Objective:    Physical Exam  BP (!) 157/76 (BP Location: Left Arm, Patient Position: Sitting, Cuff Size: Normal)   Wt 178 lb 3.2 oz (80.8 kg)   LMP 05/10/2014 (Approximate)   BMI 27.91 kg/m  Wt Readings from Last 3 Encounters:  12/23/18 178 lb 3.2 oz (80.8 kg)  08/29/18 189 lb (85.7 kg)  06/25/18 183 lb (83 kg)    Diabetic Foot Exam - Simple   No data filed     Lab Results  Component Value Date   WBC 5.9 06/25/2018   HGB 13.9 06/25/2018   HCT 42.0 06/25/2018   PLT 203 06/25/2018   GLUCOSE 131 (H) 06/25/2018   CHOL 199 06/18/2016   TRIG 194.0 (H) 06/18/2016   HDL 61.00 06/18/2016   LDLCALC 99 06/18/2016   ALT 12 06/18/2016   AST 17 06/18/2016   NA 143 06/25/2018   K 3.7 06/25/2018   CL 109 06/25/2018   CREATININE 0.81 06/25/2018   BUN 12 06/25/2018   CO2 27 06/25/2018   TSH 1.510 07/02/2017   HGBA1C 5.3 06/18/2016    Lab Results  Component Value Date   TSH 1.510 07/02/2017   Lab Results  Component Value Date   WBC 5.9 06/25/2018    HGB 13.9 06/25/2018   HCT 42.0 06/25/2018   MCV 87.3 06/25/2018   PLT 203 06/25/2018   Lab Results  Component Value Date   NA 143 06/25/2018   K 3.7 06/25/2018   CO2 27 06/25/2018   GLUCOSE 131 (H) 06/25/2018   BUN 12 06/25/2018  CREATININE 0.81 06/25/2018   BILITOT 0.8 06/18/2016   ALKPHOS 74 06/18/2016   AST 17 06/18/2016   ALT 12 06/18/2016   PROT 6.7 06/18/2016   ALBUMIN 4.4 06/18/2016   CALCIUM 9.9 06/25/2018   ANIONGAP 7 06/25/2018   GFR 78.17 06/18/2016   Lab Results  Component Value Date   CHOL 199 06/18/2016   Lab Results  Component Value Date   HDL 61.00 06/18/2016   Lab Results  Component Value Date   LDLCALC 99 06/18/2016   Lab Results  Component Value Date   TRIG 194.0 (H) 06/18/2016   Lab Results  Component Value Date   CHOLHDL 3 06/18/2016   Lab Results  Component Value Date   HGBA1C 5.3 06/18/2016       Assessment & Plan:   Problem List Items Addressed This Visit    Essential hypertension, benign    Mildly elevated. She will monitor weekly BP and if any concerning symptoms.  no changes to meds. Encouraged heart healthy diet such as the DASH diet and exercise as tolerated.       Migraines    Encouraged increased hydration, 64 ounces of clear fluids daily. Minimize alcohol and caffeine. Eat small frequent meals with lean proteins and complex carbs. Avoid high and low blood sugars. Get adequate sleep, 7-8 hours a night. Needs exercise daily preferably in the morning.      Hyperglycemia     minimize simple carbs. Increase exercise as tolerated. Continue current meds      Hyperlipidemia, mixed    Encouraged heart healthy diet, increase exercise, avoid trans fats, consider a krill oil cap daily         I am having Megan May maintain her ALPRAZolam, dexlansoprazole, furosemide, lisinopril, albuterol, and beclomethasone.  No orders of the defined types were placed in this encounter.  I discussed the assessment and  treatment plan with the patient. The patient was provided an opportunity to ask questions and all were answered. The patient agreed with the plan and demonstrated an understanding of the instructions.   The patient was advised to call back or seek an in-person evaluation if the symptoms worsen or if the condition fails to improve as anticipated.  I provided 25 minutes of non-face-to-face time during this encounter.   Penni Homans, MD

## 2018-12-26 NOTE — Assessment & Plan Note (Signed)
Encouraged increased hydration, 64 ounces of clear fluids daily. Minimize alcohol and caffeine. Eat small frequent meals with lean proteins and complex carbs. Avoid high and low blood sugars. Get adequate sleep, 7-8 hours a night. Needs exercise daily preferably in the morning.  

## 2018-12-26 NOTE — Assessment & Plan Note (Signed)
minimize simple carbs. Increase exercise as tolerated. Continue current meds  

## 2019-01-12 ENCOUNTER — Encounter (HOSPITAL_COMMUNITY): Payer: Self-pay | Admitting: Emergency Medicine

## 2019-01-12 ENCOUNTER — Emergency Department (HOSPITAL_COMMUNITY): Payer: BC Managed Care – PPO | Admitting: Anesthesiology

## 2019-01-12 ENCOUNTER — Other Ambulatory Visit: Payer: Self-pay

## 2019-01-12 ENCOUNTER — Encounter (HOSPITAL_COMMUNITY)
Admission: EM | Disposition: A | Discharge status: Home / Self Care | Payer: Self-pay | Source: Home / Self Care | Attending: Emergency Medicine

## 2019-01-12 ENCOUNTER — Ambulatory Visit (HOSPITAL_COMMUNITY)
Admission: EM | Admit: 2019-01-12 | Discharge: 2019-01-12 | Disposition: A | Discharge status: Home / Self Care | Payer: BC Managed Care – PPO | Attending: Emergency Medicine | Admitting: Emergency Medicine

## 2019-01-12 DIAGNOSIS — T18108A Unspecified foreign body in esophagus causing other injury, initial encounter: Secondary | ICD-10-CM

## 2019-01-12 DIAGNOSIS — Z881 Allergy status to other antibiotic agents status: Secondary | ICD-10-CM | POA: Diagnosis not present

## 2019-01-12 DIAGNOSIS — R1310 Dysphagia, unspecified: Secondary | ICD-10-CM | POA: Insufficient documentation

## 2019-01-12 DIAGNOSIS — Z880 Allergy status to penicillin: Secondary | ICD-10-CM | POA: Diagnosis not present

## 2019-01-12 DIAGNOSIS — J449 Chronic obstructive pulmonary disease, unspecified: Secondary | ICD-10-CM | POA: Insufficient documentation

## 2019-01-12 DIAGNOSIS — I10 Essential (primary) hypertension: Secondary | ICD-10-CM | POA: Insufficient documentation

## 2019-01-12 DIAGNOSIS — K222 Esophageal obstruction: Secondary | ICD-10-CM | POA: Diagnosis not present

## 2019-01-12 DIAGNOSIS — Z1159 Encounter for screening for other viral diseases: Secondary | ICD-10-CM | POA: Diagnosis not present

## 2019-01-12 DIAGNOSIS — Z888 Allergy status to other drugs, medicaments and biological substances status: Secondary | ICD-10-CM | POA: Diagnosis not present

## 2019-01-12 DIAGNOSIS — K200 Eosinophilic esophagitis: Secondary | ICD-10-CM | POA: Diagnosis not present

## 2019-01-12 DIAGNOSIS — Z886 Allergy status to analgesic agent status: Secondary | ICD-10-CM | POA: Insufficient documentation

## 2019-01-12 HISTORY — PX: ESOPHAGOGASTRODUODENOSCOPY (EGD) WITH PROPOFOL: SHX5813

## 2019-01-12 LAB — BASIC METABOLIC PANEL
Anion gap: 12 (ref 5–15)
BUN: 24 mg/dL — ABNORMAL HIGH (ref 6–20)
CO2: 25 mmol/L (ref 22–32)
Calcium: 10 mg/dL (ref 8.9–10.3)
Chloride: 107 mmol/L (ref 98–111)
Creatinine, Ser: 0.74 mg/dL (ref 0.44–1.00)
GFR calc Af Amer: >60 mL/min (ref >60)
GFR calc non Af Amer: >60 mL/min (ref >60)
Glucose, Bld: 94 mg/dL (ref 70–99)
Potassium: 3.5 mmol/L (ref 3.5–5.1)
Sodium: 144 mmol/L (ref 135–145)

## 2019-01-12 LAB — CBC WITH DIFFERENTIAL/PLATELET
Abs Immature Granulocytes: 0.02 10*3/uL (ref 0.00–0.07)
Basophils Absolute: 0 10*3/uL (ref 0.0–0.1)
Basophils Relative: 0 %
Eosinophils Absolute: 0.1 10*3/uL (ref 0.0–0.5)
Eosinophils Relative: 1 %
HCT: 44.3 % (ref 36.0–46.0)
Hemoglobin: 14.8 g/dL (ref 12.0–15.0)
Immature Granulocytes: 0 %
Lymphocytes Relative: 12 %
Lymphs Abs: 1.2 10*3/uL (ref 0.7–4.0)
MCH: 29 pg (ref 26.0–34.0)
MCHC: 33.4 g/dL (ref 30.0–36.0)
MCV: 86.9 fL (ref 80.0–100.0)
Monocytes Absolute: 0.3 10*3/uL (ref 0.1–1.0)
Monocytes Relative: 3 %
Neutro Abs: 8.5 10*3/uL — ABNORMAL HIGH (ref 1.7–7.7)
Neutrophils Relative %: 84 %
Platelets: 207 10*3/uL (ref 150–400)
RBC: 5.1 MIL/uL (ref 3.87–5.11)
RDW: 12.6 % (ref 11.5–15.5)
WBC: 10 10*3/uL (ref 4.0–10.5)
nRBC: 0 % (ref 0.0–0.2)

## 2019-01-12 LAB — SARS CORONAVIRUS 2 BY RT PCR (HOSPITAL ORDER, PERFORMED IN ~~LOC~~ HOSPITAL LAB): SARS Coronavirus 2: NEGATIVE

## 2019-01-12 SURGERY — ESOPHAGOGASTRODUODENOSCOPY (EGD) WITH PROPOFOL
Anesthesia: Monitor Anesthesia Care

## 2019-01-12 MED ORDER — GLUCAGON HCL RDNA (DIAGNOSTIC) 1 MG IJ SOLR
1.0000 mg | Freq: Once | INTRAMUSCULAR | Status: AC
  Administered 2019-01-12: 07:00:00 1 mg via INTRAVENOUS
  Filled 2019-01-12: qty 1

## 2019-01-12 MED ORDER — PROPOFOL 10 MG/ML IV BOLUS
INTRAVENOUS | Status: DC | PRN
  Administered 2019-01-12 (×2): 20 mg via INTRAVENOUS

## 2019-01-12 MED ORDER — GLUCAGON HCL RDNA (DIAGNOSTIC) 1 MG IJ SOLR
1.0000 mg | Freq: Once | INTRAMUSCULAR | Status: AC
  Administered 2019-01-12: 1 mg via INTRAVENOUS
  Filled 2019-01-12: qty 1

## 2019-01-12 MED ORDER — PROPOFOL 10 MG/ML IV BOLUS
INTRAVENOUS | Status: AC
  Filled 2019-01-12: qty 40

## 2019-01-12 MED ORDER — LACTATED RINGERS IV SOLN
INTRAVENOUS | Status: DC
  Administered 2019-01-12: 1000 mL via INTRAVENOUS

## 2019-01-12 MED ORDER — LIDOCAINE 2% (20 MG/ML) 5 ML SYRINGE
INTRAMUSCULAR | Status: DC | PRN
  Administered 2019-01-12: 40 mg via INTRAVENOUS

## 2019-01-12 MED ORDER — LORAZEPAM 2 MG/ML IJ SOLN
1.0000 mg | Freq: Once | INTRAMUSCULAR | Status: AC
  Administered 2019-01-12: 08:00:00 1 mg via INTRAVENOUS
  Filled 2019-01-12: qty 1

## 2019-01-12 MED ORDER — ONDANSETRON HCL 4 MG/2ML IJ SOLN
INTRAMUSCULAR | Status: DC | PRN
  Administered 2019-01-12: 4 mg via INTRAVENOUS

## 2019-01-12 MED ORDER — PROPOFOL 500 MG/50ML IV EMUL
INTRAVENOUS | Status: DC | PRN
  Administered 2019-01-12: 100 ug/kg/min via INTRAVENOUS

## 2019-01-12 SURGICAL SUPPLY — 14 items

## 2019-01-12 NOTE — H&P (Signed)
Megan May HPI: This is a 58 year old female with a PMH of EoE who presents with a food impaction.  The onset of symptoms occurred yesterday evening when she ate a piece of shrimp at 7 PM.  Since that time she was not able to tolerate any PO intake.  As a result she presented to the ER.  Since arriving in the endoscopy unit she feels that she may be able to tolerate PO.  In the past an EGD with dilation was performed.  Past Medical History:  Diagnosis Date  . Anxiety   . Asthma   . Depression    history of   . Eosinophilic esophagitis   . Fatigue 07/02/2017  . Generalized anxiety disorder   . Hyperglycemia 03/15/2016  . Hyperlipidemia, mixed 06/30/2016  . Hypersomnia, recurrent   . Hypertension   . Migraine   . Migraines   . Posttraumatic stress disorder     Past Surgical History:  Procedure Laterality Date  . CESAREAN SECTION  2004  . FRACTURE SURGERY  1973   R arm  . LAPAROTOMY Bilateral 06/14/2014   Procedure: LAPAROTOMY with right  salpingo-oophorectomy and left salpingectomy;  Surgeon: Linda Hedges, DO;  Location: [facility];  Service: Gynecology;  Laterality: Bilateral;  . TONSILLECTOMY AND ADENOIDECTOMY  1969    Family History  Problem Relation Age of Onset  . Hypertension Father   . Heart disease Father   . Diabetes Father   . CAD Father   . Stroke Maternal Grandfather   . Stroke Maternal Grandmother   . Diabetes Paternal Grandmother   . Other Cousin        bicuspid aortic valve  . Diabetes Brother     Social History:  reports that she has never smoked. She has never used smokeless tobacco. She reports that she does not drink alcohol or use drugs.  Allergies:  Allergies  Allergen Reactions  . Amoxicillin-Pot Clavulanate Rash  . Levaquin [Levofloxacin In D5w] Other (See Comments)    tendonopathy  . Clindamycin Itching  . Clindamycin/Lincomycin Itching  . Lactose Intolerance (Gi) Nausea Only  . Metoprolol     Weight gain/fluid retention   .  Penicillin G Other (See Comments)  . Aspirin Other (See Comments)    Migraine headache  . Latex Rash  . Penicillins Swelling and Rash    Did it involve swelling of the face/tongue/throat, SOB, or low BP? No Did it involve sudden or severe rash/hives, skin peeling, or any reaction on the inside of your mouth or nose? Yes Did you need to seek medical attention at a hospital or doctor's office? No When did it last happen?58 yo If all above answers are "NO", may proceed with cephalosporin use.    Medications:  Scheduled:  Continuous: . lactated ringers 1,000 mL (01/12/19 1357)    Results for orders placed or performed during the hospital encounter of 01/12/19 (from the past 24 hour(s))  SARS Coronavirus 2 (CEPHEID - Performed in [facility] hospital lab), Hosp Order     Status: None   Collection Time: 01/12/19  7:24 AM   Specimen: Nasopharyngeal Swab  Result Value Ref Range   SARS Coronavirus 2 NEGATIVE NEGATIVE  CBC with Differential/Platelet     Status: Abnormal   Collection Time: 01/12/19 10:15 AM  Result Value Ref Range   WBC 10.0 4.0 - 10.5 K/uL   RBC 5.10 3.87 - 5.11 MIL/uL   Hemoglobin 14.8 12.0 - 15.0 g/dL   HCT 44.3 36.0 -  46.0 %   MCV 86.9 80.0 - 100.0 fL   MCH 29.0 26.0 - 34.0 pg   MCHC 33.4 30.0 - 36.0 g/dL   RDW 12.6 11.5 - 15.5 %   Platelets 207 150 - 400 K/uL   nRBC 0.0 0.0 - 0.2 %   Neutrophils Relative % 84 %   Neutro Abs 8.5 (H) 1.7 - 7.7 K/uL   Lymphocytes Relative 12 %   Lymphs Abs 1.2 0.7 - 4.0 K/uL   Monocytes Relative 3 %   Monocytes Absolute 0.3 0.1 - 1.0 K/uL   Eosinophils Relative 1 %   Eosinophils Absolute 0.1 0.0 - 0.5 K/uL   Basophils Relative 0 %   Basophils Absolute 0.0 0.0 - 0.1 K/uL   Immature Granulocytes 0 %   Abs Immature Granulocytes 0.02 0.00 - 0.07 K/uL  Basic metabolic panel     Status: Abnormal   Collection Time: 01/12/19  1:41 PM  Result Value Ref Range   Sodium 144 135 - 145 mmol/L   Potassium 3.5 3.5 - 5.1 mmol/L    Chloride 107 98 - 111 mmol/L   CO2 25 22 - 32 mmol/L   Glucose, Bld 94 70 - 99 mg/dL   BUN 24 (H) 6 - 20 mg/dL   Creatinine, Ser 0.74 0.44 - 1.00 mg/dL   Calcium 10.0 8.9 - 10.3 mg/dL   GFR calc non Af Amer >60 >60 mL/min   GFR calc Af Amer >60 >60 mL/min   Anion gap 12 5 - 15     No results found.  ROS:  As stated above in the HPI otherwise negative.  Blood pressure (!) 184/89, pulse 83, temperature 98.5 F (36.9 C), temperature source Oral, resp. rate 10, height 5\' 7"  (1.702 m), weight 80.3 kg, last menstrual period 05/10/2014, SpO2 98 %.    PE: Gen: NAD, Alert and Oriented HEENT:  [place]/AT, EOMI Neck: Supple, no LAD Lungs: CTA Bilaterally CV: RRR without M/G/R ABM: Soft, NTND, +BS Ext: No C/C/E  Assessment/Plan: 1) Food impaction - EGD with dilation. 2) EoE.  Megan May D 01/12/2019, 3:28 PM

## 2019-01-12 NOTE — Discharge Instructions (Signed)

## 2019-01-12 NOTE — ED Notes (Signed)
This Probation officer received call from [employer]. Endo will be able to perform procedure around 1400-1500.

## 2019-01-12 NOTE — Anesthesia Preprocedure Evaluation (Addendum)
Anesthesia Evaluation  Patient identified by MRN, date of birth, ID band Patient awake    Reviewed: Allergy & Precautions, NPO status , Patient's Chart, lab work & pertinent test results  History of Anesthesia Complications Negative for: history of anesthetic complications  Airway Mallampati: I  TM Distance: >3 FB Neck ROM: Full    Dental  (+) Dental Advisory Given   Pulmonary COPD,  COPD inhaler,  01/12/2019 SARS Coronavirus NEG   breath sounds clear to auscultation       Cardiovascular hypertension, Pt. on medications (-) angina Rhythm:Regular Rate:Normal  '16 ECHO: EF 60-65%, valves OK   Neuro/Psych  Headaches, PSYCHIATRIC DISORDERS (PTSD) Anxiety Depression    GI/Hepatic Neg liver ROS, Food impaction   Endo/Other  negative endocrine ROS  Renal/GU negative Renal ROS     Musculoskeletal   Abdominal   Peds  Hematology negative hematology ROS (+)   Anesthesia Other Findings   Reproductive/Obstetrics                            Anesthesia Physical Anesthesia Plan  ASA: II  Anesthesia Plan: MAC   Post-op Pain Management:    Induction:   PONV Risk Score and Plan: 2 and Ondansetron and Treatment may vary due to age or medical condition  Airway Management Planned: Natural Airway and Nasal Cannula  Additional Equipment:   Intra-op Plan:   Post-operative Plan:   Informed Consent: I have reviewed the patients History and Physical, chart, labs and discussed the procedure including the risks, benefits and alternatives for the proposed anesthesia with the patient or authorized representative who has indicated his/her understanding and acceptance.     Dental advisory given  Plan Discussed with: CRNA and Surgeon  Anesthesia Plan Comments:        Anesthesia Quick Evaluation

## 2019-01-12 NOTE — ED Triage Notes (Addendum)
Patient states she has something in her throat and it making her choke. Patient states it hard to swallow her saliva because something is still stuck in her throat.

## 2019-01-12 NOTE — Transfer of Care (Signed)
Immediate Anesthesia Transfer of Care Note  Patient: Megan May  Procedure(s) Performed: ESOPHAGOGASTRODUODENOSCOPY (EGD) WITH PROPOFOL (N/A )  Patient Location: PACU  Anesthesia Type:MAC  Level of Consciousness: sedated  Airway & Oxygen Therapy: Patient Spontanous Breathing and Patient connected to nasal cannula oxygen  Post-op Assessment: Report given to RN and Post -op Vital signs reviewed and stable  Post vital signs: Reviewed and stable  Last Vitals:  Vitals Value Taken Time  BP    Temp    Pulse    Resp    SpO2      Last Pain:  Vitals:   01/12/19 1351  TempSrc: Oral  PainSc: 0-No pain         Complications: No apparent anesthesia complications

## 2019-01-12 NOTE — ED Provider Notes (Signed)
[facility] DEPT Provider Note: Georgena Spurling, MD, FACEP  CSN: [id number] MRN: [id number] ARRIVAL: 01/12/19 at 0355 ROOM: RESB/RESB   CHIEF COMPLAINT  foreign body in throat   HISTORY OF PRESENT ILLNESS  01/12/19 4:37 AM Megan May is a 58 y.o. female with eosinophilic esophagitis.  She is here after swallowing a shrimp yesterday evening about 7 PM.  The shrimp got stuck.  She was able to get some of it back up but not all of it.  She is now unable to swallow even her own saliva.  Attempts to drink fluids can cause regurgitation of the fluid.  She is not regurgitating food.  She is not in frank pain but is having moderate discomfort from the foreign body sensation.   Past Medical History:  Diagnosis Date  . Anxiety   . Asthma   . Depression    history of   . Eosinophilic esophagitis   . Fatigue 07/02/2017  . Generalized anxiety disorder   . Hyperglycemia 03/15/2016  . Hyperlipidemia, mixed 06/30/2016  . Hypersomnia, recurrent   . Hypertension   . Migraine   . Migraines   . Posttraumatic stress disorder     Past Surgical History:  Procedure Laterality Date  . CESAREAN SECTION  2004  . FRACTURE SURGERY  1973   R arm  . LAPAROTOMY Bilateral 06/14/2014   Procedure: LAPAROTOMY with right  salpingo-oophorectomy and left salpingectomy;  Surgeon: Linda Hedges, DO;  Location: [facility];  Service: Gynecology;  Laterality: Bilateral;  . TONSILLECTOMY AND ADENOIDECTOMY  1969    Family History  Problem Relation Age of Onset  . Hypertension Father   . Heart disease Father   . Diabetes Father   . CAD Father   . Stroke Maternal Grandfather   . Stroke Maternal Grandmother   . Diabetes Paternal Grandmother   . Other Cousin        bicuspid aortic valve  . Diabetes Brother     Social History   Tobacco Use  . Smoking status: Never Smoker  . Smokeless tobacco: Never Used  Substance Use Topics  . Alcohol use: No  . Drug use: No    Prior to Admission  medications   Medication Sig Start Date End Date Taking? Authorizing Provider  albuterol (PROVENTIL HFA;VENTOLIN HFA) 108 (90 Base) MCG/ACT inhaler Inhale 1-2 puffs into the lungs every 6 (six) hours as needed for wheezing or shortness of breath. 10/27/18   Mosie Lukes, MD  ALPRAZolam Duanne Moron) 0.5 MG tablet Take 0.5 mg by mouth 2 (two) times daily as needed for anxiety.     [provider]  beclomethasone (QVAR REDIHALER) 80 MCG/ACT inhaler Inhale 2 puffs into the lungs 2 (two) times daily. 11/01/18   Mosie Lukes, MD  dexlansoprazole (DEXILANT) 60 MG capsule Take 60 mg by mouth daily.  03/16/15   [provider]  furosemide (LASIX) 20 MG tablet 1 TABLET BY MOUTH DAILY AS NEEDED 08/29/18   Erlene Quan, PA-C  lisinopril (PRINIVIL,ZESTRIL) 40 MG tablet 1 tablet by mouth now and then 1 tablet daily 08/29/18   Erlene Quan, PA-C  DULoxetine (CYMBALTA) 30 MG capsule Take 60 mg by mouth daily.   10/21/11  [provider]  topiramate (TOPAMAX) 25 MG capsule Take 75 mg by mouth daily.   10/21/11  [provider]    Allergies Amoxicillin-pot clavulanate, Levaquin [levofloxacin in d5w], Clindamycin, Clindamycin/lincomycin, Lactose intolerance (gi), Metoprolol, Penicillin g, Aspirin, Latex, and Penicillins   REVIEW OF  SYSTEMS  Negative except as noted here or in the History of Present Illness.   PHYSICAL EXAMINATION  Initial Vital Signs Blood pressure (!) 186/97, pulse 94, temperature 98.8 F (37.1 C), temperature source Oral, resp. rate 11, height 5\' 7"  (1.702 m), weight 80.3 kg, last menstrual period 05/10/2014, SpO2 100 %.  Examination General: Well-developed, well-nourished female in no acute distress; appearance consistent with age of record HENT: normocephalic; atraumatic Eyes: pupils equal, round and reactive to light; extraocular muscles intact Neck: supple Heart: regular rate and rhythm; no murmur Lungs: clear to auscultation bilaterally Abdomen:  soft; nondistended; nontender; bowel sounds present Extremities: No deformity; full range of motion; pulses normal Neurologic: Awake, alert and oriented; motor function intact in all extremities and symmetric; no facial droop Skin: Warm and dry Psychiatric: Tearful   RESULTS  Summary of this visit's results, reviewed by myself:   EKG Interpretation  Date/Time:  Thursday January 12 2019 04:42:47 EDT Ventricular Rate:  73 PR Interval:    QRS Duration: 87 QT Interval:  390 QTC Calculation: 430 R Axis:   28 Text Interpretation:  Sinus rhythm Nonspecific T abnormalities, lateral leads No significant change since last tracing Confirmed by [name] [phone]) on 01/12/2019 6:58:05 AM      Laboratory Studies: No results found for this or any previous visit (from the past 24 hour(s)). Imaging Studies: No results found.  ED COURSE and MDM  Nursing notes and initial vitals signs, including pulse oximetry, reviewed.  Vitals:   01/12/19 0400 01/12/19 0409 01/12/19 0630  BP: (!) 188/103 (!) 186/97 (!) 175/88  Pulse: 89 94 65  Resp: 20 11 17   Temp: 98.8 F (37.1 C)    TempSrc: Oral    SpO2: 100% 100% 97%  Weight: 80.3 kg    Height: 5\' 7"  (1.702 m)     7:04 AM Dr. Collene Mares of GI paged but has not responded.  Dr. Darl Householder to follow-up and make disposition.  No response to 2 mg of glucagon IV.  PROCEDURES    ED DIAGNOSES     ICD-10-CM   1. Foreign body in esophagus, initial encounter  T18.108A        Shanon Rosser, MD 01/12/19 [phone]

## 2019-01-12 NOTE — ED Notes (Signed)
Bed: WA15 Expected date:  Expected time:  Means of arrival:  Comments: RES B 

## 2019-01-12 NOTE — Op Note (Signed)
[facility] Patient Name: Megan May Procedure Date: 01/12/2019 MRN: [id number] Attending MD: Carol Ada , MD Date of Birth: 12/20/1960 CSN: [id number] Age: 58 Admit Type: Emergency Department Procedure:                Upper GI endoscopy Indications:              Dysphagia Providers:                Carol Ada, MD, Cleda Daub, RN, Jeanella Cara, RN, Marguerita Merles, Technician,                            Cherylynn Ridges, Technician Referring MD:              Medicines:                Propofol per Anesthesia Complications:            No immediate complications. Estimated Blood Loss:     Estimated blood loss: none. Procedure:                Pre-Anesthesia Assessment:                           - Prior to the procedure, a History and Physical                            was performed, and patient medications and                            allergies were reviewed. The patient's tolerance of                            previous anesthesia was also reviewed. The risks                            and benefits of the procedure and the sedation                            options and risks were discussed with the patient.                            All questions were answered, and informed consent                            was obtained. Prior Anticoagulants: The patient has                            taken no previous anticoagulant or antiplatelet                            agents. ASA Grade Assessment: II - A patient with                            mild systemic  disease. After reviewing the risks                            and benefits, the patient was deemed in                            satisfactory condition to undergo the procedure.                           - Sedation was administered by an anesthesia                            professional. Deep sedation was attained.                           After obtaining informed  consent, the endoscope was                            passed under direct vision. Throughout the                            procedure, the patient's blood pressure, pulse, and                            oxygen saturations were monitored continuously. The                            GIF-H190 ([id number]) Olympus gastroscope was                            introduced through the mouth, and advanced to the                            second part of duodenum. The upper GI endoscopy was                            accomplished without difficulty. The patient                            tolerated the procedure well. Scope In: Scope Out: Findings:      One benign-appearing, intrinsic moderate stenosis was found at the       gastroesophageal junction. This stenosis measured 1 cm (inner diameter)       x 1 cm (in length). The stenosis was traversed.      The stomach was normal.      The examined duodenum was normal. Impression:               - Benign-appearing esophageal stenosis.                           - Normal stomach.                           - Normal examined duodenum.                           -  No specimens collected. Moderate Sedation:      Not Applicable - Patient had care per Anesthesia. Recommendation:           - Patient has a contact number available for                            emergencies. The signs and symptoms of potential                            delayed complications were discussed with the                            patient. Return to normal activities tomorrow.                            Written discharge instructions were provided to the                            patient.                           - Resume previous diet.                           - Continue present medications.                           - Chew food well.                           - Follow up with Dr. Collene Mares in 2 weeks. Procedure Code(s):        --- Professional ---                           [phone],  Esophagogastroduodenoscopy, flexible,                            transoral; diagnostic, including collection of                            specimen(s) by brushing or washing, when performed                            (separate procedure) Diagnosis Code(s):        --- Professional ---                           K22.2, Esophageal obstruction                           R13.10, Dysphagia, unspecified CPT copyright 2019 American Medical Association. All rights reserved. The codes documented in this report are preliminary and upon coder review may  be revised to meet current compliance requirements. Carol Ada, MD Carol Ada, MD 01/12/2019 3:50:56 PM This report has been signed electronically. Number of Addenda: 0

## 2019-01-12 NOTE — ED Notes (Signed)
Patient states slight bleeding nasally after Covid swab-some bleeding when she coughs/vomits as well-informed primary RN-reassured patient her nares might be irritated from swab used for testing-throat might be irritated from vomiting

## 2019-01-12 NOTE — ED Provider Notes (Signed)
  Physical Exam  BP (!) 170/74   Pulse 73   Temp 98.8 F (37.1 C) (Oral)   Resp 15   Ht 5\' 7"  (1.702 m)   Wt 80.3 kg   LMP 05/10/2014 (Approximate)   SpO2 98%   BMI 27.72 kg/m   Physical Exam  ED Course/Procedures     Procedures  MDM  Care assumed at 7 am.  Patient had foreign body sensation after eating.  She required endoscopy previously for food impaction.  Patient unable to tolerate anything by mouth despite given meds.  Signout pending evaluation by Dr. Collene Mares from GI.  7 am Dr. Collene Mares wants to bring patient to endoscopy but needs COVID testing. COVID test send.   8:30 AM  COVID negative. Dr. Collene Mares plans to perform endoscopy. Stable for discharge after endoscopy       Drenda Freeze, MD 01/12/19 [phone]

## 2019-01-12 NOTE — Anesthesia Procedure Notes (Signed)
Date/Time: 01/12/2019 3:36 PM Performed by: Talbot Grumbling, CRNA Oxygen Delivery Method: Nasal cannula

## 2019-01-12 NOTE — ED Notes (Signed)
Pt dressed into gown and placed on monitor. Pt has call bell within reach and the bed is in the lowest position

## 2019-01-13 ENCOUNTER — Encounter (HOSPITAL_COMMUNITY): Payer: Self-pay | Admitting: Gastroenterology

## 2019-01-13 NOTE — Anesthesia Postprocedure Evaluation (Signed)
Anesthesia Post Note  Patient: HASNA STEFANIK  Procedure(s) Performed: ESOPHAGOGASTRODUODENOSCOPY (EGD) WITH PROPOFOL (N/A )     Patient location during evaluation: Endoscopy Anesthesia Type: MAC Level of consciousness: awake and alert, patient cooperative and oriented Pain management: pain level controlled Vital Signs Assessment: post-procedure vital signs reviewed and stable Respiratory status: spontaneous breathing, nonlabored ventilation and respiratory function stable Cardiovascular status: blood pressure returned to baseline and stable Postop Assessment: no apparent nausea or vomiting, adequate PO intake and able to ambulate Anesthetic complications: no    Last Vitals:  Vitals:   01/12/19 1559 01/12/19 1600  BP: (!) 143/54   Pulse: 68 66  Resp: (!) 23 (!) 22  Temp:    SpO2: 100% 100%    Last Pain:  Vitals:   01/12/19 1559  TempSrc:   PainSc: 0-No pain                 Labib Cwynar,E. Chimere Klingensmith

## 2019-03-14 ENCOUNTER — Encounter: Payer: Self-pay | Admitting: Family Medicine

## 2019-03-14 ENCOUNTER — Other Ambulatory Visit: Payer: Self-pay | Admitting: Family Medicine

## 2019-03-14 DIAGNOSIS — Z1231 Encounter for screening mammogram for malignant neoplasm of breast: Secondary | ICD-10-CM

## 2019-03-14 DIAGNOSIS — N644 Mastodynia: Secondary | ICD-10-CM

## 2019-03-16 ENCOUNTER — Other Ambulatory Visit: Payer: Self-pay | Admitting: Family Medicine

## 2019-03-16 DIAGNOSIS — N644 Mastodynia: Secondary | ICD-10-CM

## 2019-03-24 ENCOUNTER — Ambulatory Visit: Payer: BC Managed Care – PPO

## 2019-03-24 ENCOUNTER — Other Ambulatory Visit: Payer: Self-pay

## 2019-03-24 ENCOUNTER — Ambulatory Visit
Admission: RE | Admit: 2019-03-24 | Discharge: 2019-03-24 | Disposition: A | Discharge status: Home / Self Care | Payer: BC Managed Care – PPO | Source: Ambulatory Visit | Attending: Family Medicine | Admitting: Family Medicine

## 2019-03-24 DIAGNOSIS — N644 Mastodynia: Secondary | ICD-10-CM

## 2019-05-04 ENCOUNTER — Encounter: Payer: Self-pay | Admitting: Family Medicine

## 2019-05-04 DIAGNOSIS — J029 Acute pharyngitis, unspecified: Secondary | ICD-10-CM

## 2019-05-05 ENCOUNTER — Other Ambulatory Visit: Payer: Self-pay | Admitting: Family Medicine

## 2019-05-05 ENCOUNTER — Encounter: Payer: Self-pay | Admitting: Family Medicine

## 2019-05-05 ENCOUNTER — Telehealth: Payer: Self-pay | Admitting: Family Medicine

## 2019-05-05 DIAGNOSIS — J029 Acute pharyngitis, unspecified: Secondary | ICD-10-CM

## 2019-05-05 DIAGNOSIS — R059 Cough, unspecified: Secondary | ICD-10-CM

## 2019-05-05 DIAGNOSIS — R05 Cough: Secondary | ICD-10-CM

## 2019-05-05 MED ORDER — AZITHROMYCIN 250 MG PO TABS: ORAL_TABLET | ORAL | 0 refills | Status: AC

## 2019-05-05 MED ORDER — CEFDINIR 300 MG PO CAPS: 300.0000 mg | ORAL_CAPSULE | Freq: Two times a day (BID) | ORAL | 0 refills | Status: AC

## 2019-05-05 NOTE — Telephone Encounter (Signed)
Patient calling call center because pharmacy states they did not receive antibiotic that was sent in earlier.   Ended up calling patient because her call was after pharmacy hours so antibiotic was not able to be resent for pick up this evening. She was fine with waiting until morning- has not had worsening of symptoms, but has not gotten better. Reviewed with her that in chart it looked like everything went through to pharmacy previously.   Wanted to confirm allergies before resending medication. She worried about allergy due to prolonged allergic response to medications in past. After discussion of pros/cons we elected to send in zithromax instead since she knows she can tolerate this well.   She also is concerned about COVID. She has colleague that had known exposure and just went for testing.   Advised she can try [facility] testing, [employer] testing over weekend if wanting to get this completed. Gave her number to call through [employer] to get her testing set up.

## 2019-05-05 NOTE — Telephone Encounter (Signed)
Spoke with patient about medication being called in and also going to get tested next week

## 2019-05-05 NOTE — Telephone Encounter (Signed)
Called patient unable to leave voicemail  

## 2019-05-10 ENCOUNTER — Telehealth: Payer: Self-pay

## 2019-05-10 NOTE — Telephone Encounter (Signed)
Copied from [place] [phone]. Topic: General - Other >> May 04, 2019 12:43 PM Rutherford Nail, NT wrote: Reason for CRM: Patient calling in regards to the MyChart message sent this morning (05/04/2019). States that she forgot to add that she has been getting a low grade fever (99.8*) that keeps fluctuating. PLease advise.

## 2019-08-26 ENCOUNTER — Encounter: Payer: Self-pay | Admitting: Family Medicine

## 2019-08-28 ENCOUNTER — Other Ambulatory Visit: Payer: Self-pay | Admitting: Family Medicine

## 2019-08-28 MED ORDER — NEOMYCIN-POLYMYXIN-HC 3.5-10000-1 OT SOLN: 3.0000 [drp] | Freq: Three times a day (TID) | OTIC | 0 refills | Status: DC

## 2019-10-09 ENCOUNTER — Other Ambulatory Visit: Payer: Self-pay | Admitting: Cardiology

## 2019-10-10 ENCOUNTER — Telehealth (INDEPENDENT_AMBULATORY_CARE_PROVIDER_SITE_OTHER): Payer: BC Managed Care – PPO | Admitting: Adult Health

## 2019-10-10 ENCOUNTER — Encounter: Payer: Self-pay | Admitting: Adult Health

## 2019-10-10 VITALS — BP 137/78 | HR 68 | Ht 67.0 in | Wt 185.0 lb

## 2019-10-10 DIAGNOSIS — I10 Essential (primary) hypertension: Secondary | ICD-10-CM

## 2019-10-10 DIAGNOSIS — F419 Anxiety disorder, unspecified: Secondary | ICD-10-CM

## 2019-10-10 MED ORDER — LISINOPRIL 20 MG PO TABS: 20.0000 mg | ORAL_TABLET | Freq: Every day | ORAL | 3 refills | Status: DC

## 2019-10-10 MED ORDER — FUROSEMIDE 20 MG PO TABS: 20.0000 mg | ORAL_TABLET | ORAL | 1 refills | Status: DC | PRN

## 2019-10-10 NOTE — Patient Instructions (Signed)
Medication Instructions:  Continue current medications  *If you need a refill on your cardiac medications before your next appointment, please call your pharmacy*   Lab Work: None Ordered   Testing/Procedures: None Ordered   Follow-Up: At [facility], you and your health needs are our priority.  As part of our continuing mission to provide you with exceptional heart care, we have created designated Provider Care Teams.  These Care Teams include your primary Cardiologist (physician) and Advanced Practice Providers (APPs -  Physician Assistants and Nurse Practitioners) who all work together to provide you with the care you need, when you need it.  We recommend signing up for the patient portal called "MyChart".  Sign up information is provided on this After Visit Summary.  MyChart is used to connect with patients for Virtual Visits (Telemedicine).  Patients are able to view lab/test results, encounter notes, upcoming appointments, etc.  Non-urgent messages can be sent to your provider as well.   To learn more about what you can do with MyChart, go to [email or web address].    Your next appointment:   1 year(s)  The format for your next appointment:   In Person  Provider:   You may see Dr Oval Linsey or one of the following Advanced Practice Providers on your designated Care Team:    Kerin Ransom, PA-C  [place], [place]  Coletta Memos, [place]

## 2019-10-10 NOTE — Progress Notes (Signed)
Virtual Visit via Telephone Note   This visit type was conducted due to national recommendations for restrictions regarding the COVID-19 Pandemic (e.g. social distancing) in an effort to limit this patient's exposure and mitigate transmission in our community.  Due to her co-morbid illnesses, this patient is at least at moderate risk for complications without adequate follow up.  This format is felt to be most appropriate for this patient at this time.  The patient did not have access to video technology/had technical difficulties with video requiring transitioning to audio format only (telephone).  All issues noted in this document were discussed and addressed.  No physical exam could be performed with this format.  Please refer to the patient's chart for her  consent to telehealth for [facility].   Date:  10/10/2019   ID:  Megan May, DOB 1961/02/20, MRN [id number]  Patient Location: Home Provider Location: Office  PCP:  Mosie Lukes, MD  Cardiologist: Dr.Milan Electrophysiologist:  None   Evaluation Performed:  Follow-Up Visit  Chief Complaint: Needs med refills  History of Present Illness:    Megan May is a 59 y.o. female for ongoing assessment and management of hypertension, with other history to include esophagitis, depression, and anxiety.  She has had a negative ETT on 05/14/2016.  She was started on lisinopril 20 mg daily with HCTZ.  Echocardiogram in 03/09/2016 revealed LVEF of 65% to 70% otherwise no evidence of diastolic dysfunction.  She often has blood pressure spikes in the setting of elevated levels of anxiety.  She has been medically compliant.  She is seen today via telemedicine and is without any complaints.  Blood pressure is well controlled.  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  She is here for blood pressure medication refills.  She has not been seen by PCP in over a  year.  She did have labs completed in June 2020 which did show borderline low potassium at 3.5.  The patient Dose not  have symptoms concerning for COVID-19 infection (fever, chills, cough, or new shortness of breath).    Past Medical History:  Diagnosis Date  . Anxiety   . Asthma   . Depression    history of   . Eosinophilic esophagitis   . Fatigue 07/02/2017  . Generalized anxiety disorder   . Hyperglycemia 03/15/2016  . Hyperlipidemia, mixed 06/30/2016  . Hypersomnia, recurrent   . Hypertension   . Migraine   . Migraines   . Posttraumatic stress disorder    Past Surgical History:  Procedure Laterality Date  . CESAREAN SECTION  2004  . ESOPHAGOGASTRODUODENOSCOPY (EGD) WITH PROPOFOL N/A 01/12/2019   Procedure: ESOPHAGOGASTRODUODENOSCOPY (EGD) WITH PROPOFOL;  Surgeon: Carol Ada, MD;  Location: [facility];  Service: Endoscopy;  Laterality: N/A;  . FRACTURE SURGERY  1973   R arm  . LAPAROTOMY Bilateral 06/14/2014   Procedure: LAPAROTOMY with right  salpingo-oophorectomy and left salpingectomy;  Surgeon: Linda Hedges, DO;  Location: [facility];  Service: Gynecology;  Laterality: Bilateral;  . TONSILLECTOMY AND ADENOIDECTOMY  1969     Current Meds  Medication Sig  . albuterol (PROVENTIL HFA;VENTOLIN HFA) 108 (90 Base) MCG/ACT inhaler Inhale 1-2 puffs into the lungs every 6 (six) hours as needed for wheezing or shortness of breath.  . beclomethasone (QVAR REDIHALER) 80 MCG/ACT inhaler Inhale 2 puffs into the lungs 2 (two) times daily.  . furosemide (LASIX) 20 MG tablet Take 20 mg  by mouth as needed.  Marland Kitchen lisinopril (ZESTRIL) 20 MG tablet Take 20 mg by mouth daily.  Marland Kitchen omeprazole (PRILOSEC) 40 MG capsule Take 40 mg by mouth daily.  . [DISCONTINUED] dexlansoprazole (DEXILANT) 60 MG capsule Take 60 mg by mouth daily.      Allergies:   Amoxicillin-pot clavulanate, Levaquin [levofloxacin in d5w], Clindamycin, Clindamycin/lincomycin, Lactose intolerance (gi), Metoprolol, Penicillin g,  Aspirin, Latex, and Penicillins   Social History   Tobacco Use  . Smoking status: Never Smoker  . Smokeless tobacco: Never Used  Substance Use Topics  . Alcohol use: No  . Drug use: No     Family Hx: The patient's family history includes CAD in her father; Diabetes in her brother, father, and paternal grandmother; Heart disease in her father; Hypertension in her father; Other in her cousin; Stroke in her maternal grandfather and maternal grandmother.  ROS:   Please see the history of present illness.    All other systems reviewed and are negative.   Prior CV studies:   The following studies were reviewed today: Echocardiogram 03/09/2016 Left ventricle: The cavity size was normal. Systolic function was  vigorous. The estimated ejection fraction was in the range of 65%  to 70%. Wall motion was normal; there were no regional wall  motion abnormalities. Left ventricular diastolic function  parameters were normal.  - Aortic valve: Transvalvular velocity was within the normal range.  There was no stenosis. There was no regurgitation.  - Mitral valve: Transvalvular velocity was within the normal range.  There was no evidence for stenosis. There was trivial  regurgitation.  - Right ventricle: The cavity size was normal. Wall thickness was  normal. Systolic function was normal.  - Atrial septum: No defect or patent foramen ovale was identified  by color flow Doppler.  - Tricuspid valve: There was trivial regurgitation.  - Pulmonary arteries: Systolic pressure was within the normal  range. PA peak pressure: 18 mm Hg (S).   Labs/Other Tests and Data Reviewed:    EKG:  No ECG reviewed.  Recent Labs: 01/12/2019: BUN 24; Creatinine, Ser 0.74; Hemoglobin 14.8; Platelets 207; Potassium 3.5; Sodium 144   Recent Lipid Panel Lab Results  Component Value Date/Time   CHOL 199 06/18/2016 04:24 PM   TRIG 194.0 (H) 06/18/2016 04:24 PM   HDL 61.00 06/18/2016 04:24 PM    CHOLHDL 3 06/18/2016 04:24 PM   LDLCALC 99 06/18/2016 04:24 PM    Wt Readings from Last 3 Encounters:  10/10/19 185 lb (83.9 kg)  01/12/19 177 lb 0.5 oz (80.3 kg)  12/23/18 178 lb 3.2 oz (80.8 kg)     Objective:    Vital Signs:  BP 137/78   Pulse 68   Ht 5\' 7"  (1.702 m)   Wt 185 lb (83.9 kg)   LMP 05/10/2014 (Approximate)   BMI 28.98 kg/m    VITAL SIGNS:  reviewed  ASSESSMENT & PLAN:    1. Hypertension: She continues on lisinopril 20 mg daily and has had good blood pressure control.  She uses Lasix 20 mg as needed for fluid retention.  His only taking it rarely maybe once a month.  She is given refills on lisinopril and Lasix.  I have asked her to have lab work completed by her PCP which should include a BMET.  We will need copies of the labs once they are completed.  She is due to have a follow-up visit with PCP within the next few months.  If she does not do so  I will have this ordered especially to evaluate kidney function and potassium status on lisinopril.  2.  Anxiety and depression: She is to follow with PCP for ongoing management and need for referral to psychotherapist if clinically warranted.  COVID-19 Education: The signs and symptoms of COVID-19 were discussed with the patient and how to seek care for testing (follow up with PCP or arrange E-visit).  The importance of social distancing was discussed today.  Time:   Today, I have spent 15 minutes with the patient with telehealth technology discussing the above problems.     Medication Adjustments/Labs and Tests Ordered: Current medicines are reviewed at length with the patient today.  Concerns regarding medicines are outlined above.   Tests Ordered: No orders of the defined types were placed in this encounter.   Medication Changes: No orders of the defined types were placed in this encounter.   Disposition:  Follow up 1 year  Signed, Phill Myron. West Pugh, ANP, AACC  10/10/2019 2:57 PM    [facility]

## 2020-01-03 ENCOUNTER — Ambulatory Visit (INDEPENDENT_AMBULATORY_CARE_PROVIDER_SITE_OTHER): Payer: BC Managed Care – PPO | Admitting: Bariatrics

## 2020-01-03 ENCOUNTER — Encounter (INDEPENDENT_AMBULATORY_CARE_PROVIDER_SITE_OTHER): Payer: Self-pay

## 2020-01-03 ENCOUNTER — Other Ambulatory Visit: Payer: Self-pay

## 2020-01-04 NOTE — Progress Notes (Signed)
No charge. Pt left.

## 2020-01-17 ENCOUNTER — Ambulatory Visit (INDEPENDENT_AMBULATORY_CARE_PROVIDER_SITE_OTHER): Payer: BC Managed Care – PPO | Admitting: Bariatrics

## 2020-09-10 ENCOUNTER — Other Ambulatory Visit: Payer: Self-pay

## 2020-09-10 ENCOUNTER — Telehealth: Payer: BC Managed Care – PPO | Admitting: Family Medicine

## 2020-09-10 ENCOUNTER — Encounter: Payer: Self-pay | Admitting: Family Medicine

## 2020-09-10 DIAGNOSIS — H00019 Hordeolum externum unspecified eye, unspecified eyelid: Secondary | ICD-10-CM

## 2020-09-11 ENCOUNTER — Other Ambulatory Visit: Payer: Self-pay | Admitting: Family Medicine

## 2020-09-11 ENCOUNTER — Telehealth: Payer: Self-pay

## 2020-09-11 MED ORDER — POLYMYXIN B-TRIMETHOPRIM 10000-0.1 UNIT/ML-% OP SOLN: OPHTHALMIC | 0 refills | Status: DC

## 2020-09-11 NOTE — Telephone Encounter (Signed)
Pt was seen by Dr. Charlett Blake last night

## 2020-09-11 NOTE — Telephone Encounter (Signed)
Looks like patient did virtual yesterday.

## 2020-09-11 NOTE — Telephone Encounter (Signed)
Dr. Charlett Blake sent in eye drop for pt

## 2020-09-11 NOTE — Telephone Encounter (Signed)
Caller states she had an virtual appt earlier today that did not happen and so she is trying to get one tonight.

## 2020-09-11 NOTE — Progress Notes (Signed)
Patient not seen.

## 2021-01-06 ENCOUNTER — Encounter: Payer: Self-pay | Admitting: Family Medicine

## 2021-01-07 ENCOUNTER — Encounter: Payer: Self-pay | Admitting: Family Medicine

## 2021-01-07 ENCOUNTER — Telehealth (INDEPENDENT_AMBULATORY_CARE_PROVIDER_SITE_OTHER): Payer: BC Managed Care – PPO | Admitting: Family Medicine

## 2021-01-07 DIAGNOSIS — U071 COVID-19: Secondary | ICD-10-CM

## 2021-01-07 MED ORDER — MOLNUPIRAVIR EUA 200MG CAPSULE: 4.0000 | ORAL_CAPSULE | Freq: Two times a day (BID) | ORAL | 0 refills | Status: AC

## 2021-01-07 NOTE — Progress Notes (Signed)
Virtual Visit via Video Note  I connected with Megan May  on 01/07/21 at  5:40 PM EDT by a video enabled telemedicine application and verified that I am speaking with the correct person using two identifiers.  Location patient: home, [place] Location provider:work or home office Persons participating in the virtual visit: patient, provider  I discussed the limitations of evaluation and management by telemedicine and the availability of in person appointments. The patient expressed understanding and agreed to proceed.   HPI:  Acute telemedicine visit for COVID19: -Onset: 3 days ago;  -Symptoms include: had abd pain and sore throat initially, itchy ears, sore throat intermittent, nasal congestion, fever 101.5 yesterday, back pain briefly, cough -Denies:CP, NVD, inability to eat.drink/get out of bed -Has tried: tylenol -Pertinent past medical history: HTN, Asthma, Depression and more see below -Pertinent medication allergies:  Allergies  Allergen Reactions  . Amoxicillin-Pot Clavulanate Rash  . Levaquin [Levofloxacin In D5w] Other (See Comments)    tendonopathy  . Clindamycin Itching  . Clindamycin/Lincomycin Itching  . Lactose Intolerance (Gi) Nausea Only  . Metoprolol     Weight gain/fluid retention   . Penicillin G Other (See Comments)  . Aspirin Other (See Comments)    Migraine headache  . Latex Rash  . Penicillins Swelling and Rash    Did it involve swelling of the face/tongue/throat, SOB, or low BP? No Did it involve sudden or severe rash/hives, skin peeling, or any reaction on the inside of your mouth or nose? Yes Did you need to seek medical attention at a hospital or doctor's office? No When did it last happen?60 yo If all above answers are "NO", may proceed with cephalosporin use.   -COVID-19 vaccine status: vaccinated and had 1 booster -no labs in the last 3 months  ROS: See pertinent positives and negatives per HPI.  Past Medical History:  Diagnosis Date  .  Anxiety   . Asthma   . Depression    history of   . Eosinophilic esophagitis   . Fatigue 07/02/2017  . Generalized anxiety disorder   . Hyperglycemia 03/15/2016  . Hyperlipidemia, mixed 06/30/2016  . Hypersomnia, recurrent   . Hypertension   . Migraine   . Migraines   . Posttraumatic stress disorder     Past Surgical History:  Procedure Laterality Date  . CESAREAN SECTION  2004  . ESOPHAGOGASTRODUODENOSCOPY (EGD) WITH PROPOFOL N/A 01/12/2019   Procedure: ESOPHAGOGASTRODUODENOSCOPY (EGD) WITH PROPOFOL;  Surgeon: Carol Ada, MD;  Location: [facility];  Service: Endoscopy;  Laterality: N/A;  . FRACTURE SURGERY  1973   R arm  . LAPAROTOMY Bilateral 06/14/2014   Procedure: LAPAROTOMY with right  salpingo-oophorectomy and left salpingectomy;  Surgeon: Linda Hedges, DO;  Location: [facility];  Service: Gynecology;  Laterality: Bilateral;  . TONSILLECTOMY AND ADENOIDECTOMY  1969     Current Outpatient Medications:  .  albuterol (PROVENTIL HFA;VENTOLIN HFA) 108 (90 Base) MCG/ACT inhaler, Inhale 1-2 puffs into the lungs every 6 (six) hours as needed for wheezing or shortness of breath., Disp: 18 g, Rfl: 5 .  beclomethasone (QVAR REDIHALER) 80 MCG/ACT inhaler, Inhale 2 puffs into the lungs 2 (two) times daily., Disp: 1 Inhaler, Rfl: 0 .  furosemide (LASIX) 20 MG tablet, Take 1 tablet (20 mg total) by mouth as needed., Disp: 90 tablet, Rfl: 1 .  lisinopril (ZESTRIL) 20 MG tablet, Take 1 tablet (20 mg total) by mouth daily., Disp: 90 tablet, Rfl: 3 .  molnupiravir EUA 200 mg CAPS, Take 4 capsules (800 mg total)  by mouth 2 (two) times daily for 5 days., Disp: 40 capsule, Rfl: 0 .  omeprazole (PRILOSEC) 40 MG capsule, Take 40 mg by mouth daily., Disp: , Rfl:   EXAM:  VITALS per patient if applicable:  GENERAL: alert, oriented, appears well and in no acute distress  HEENT: atraumatic, conjunttiva clear, no obvious abnormalities on inspection of external nose and ears  NECK: normal  movements of the head and neck  LUNGS: on inspection no signs of respiratory distress, breathing rate appears normal, no obvious gross SOB, gasping or wheezing  CV: no obvious cyanosis  MS: moves all visible extremities without noticeable abnormality  PSYCH/NEURO: pleasant and cooperative, no obvious depression or anxiety, speech and thought processing grossly intact  ASSESSMENT AND PLAN:  Discussed the following assessment and plan:  COVID-19   Discussed treatment options, ideal treatment window, potential complications, isolation and precautions for COVID-19.  After lengthy discussion, the patient opted for treatment with molnupiravir due to being higher risk for complications of covid or severe disease and other factors. Discussed EUA status of this drug and the fact that there is preliminary limited knowledge of risks/interactions/side effects per EUA document vs possible benefits and precautions. This information was shared with patient during the visit and also was provided in patient instructions. Also, advised that patient discuss risks/interactions and use with pharmacist/treatment team as well.  Other symptomatic care measures summarized in patient instructions. Work/School slipped offered: declined Scheduled follow up with PCP offered: opted for follow up as needed Advised to seek prompt in person care if worsening, new symptoms arise, or if is not improving with treatment. Discussed options for inperson care if PCP office not available. Did let this patient know that I only do telemedicine on Tuesdays and Thursdays for [facility]. Advised to schedule follow up visit with PCP or [facility] if any further questions or concerns to avoid delays in care.   I discussed the assessment and treatment plan with the patient. The patient was provided an opportunity to ask questions and all were answered. The patient agreed with the plan and demonstrated an understanding of the instructions.     Lucretia Kern, DO

## 2021-01-07 NOTE — Patient Instructions (Addendum)
HOME CARE TIPS:   -I sent the medication(s) we discussed to your pharmacy: Meds ordered this encounter  Medications   molnupiravir EUA 200 mg CAPS    Sig: Take 4 capsules (800 mg total) by mouth 2 (two) times daily for 5 days.    Dispense:  40 capsule    Refill:  0     -I sent in the Covid19 treatment you requested per our discussion. Please see the information provided below and discuss further with the pharmacist/treatment team.   -can use tylenol for fevers, aches and pains per instructions  -can use nasal saline a few times per day if you have nasal congestion; sometimes  a short course of Afrin nasal spray for 3 days can help with symptoms as well  -stay hydrated, drink plenty of fluids and eat small healthy meals - avoid dairy  -follow up with your doctor in 2-3 days unless improving and feeling better  -stay home while sick, except to seek medical care. If you have COVID19, ideally it would be best to stay home for a full 10 days since the onset of symptoms PLUS one day of no fever and feeling better. Wear a good mask that fits snugly (such as N95 or KN95) if around others to reduce the risk of transmission.  It was nice to meet you today, and I really hope you are feeling better soon. I help [facility] out with telemedicine visits on Tuesdays and Thursdays and am available for visits on those days. If you have any concerns or questions following this visit please schedule a follow up visit with your Primary Care doctor or seek care at a local urgent care clinic to avoid delays in care.    Seek in person care or schedule a follow up video visit promptly if your symptoms worsen, new concerns arise or you are not improving with treatment. Call 911 and/or seek emergency care if your symptoms are severe or life threatening.     Fact Sheet for Patients And Caregivers Emergency Use Authorization (EUA) Of LAGEVRIO (molnupiravir) capsules For Coronavirus Disease 2019  (COVID-19)  What is the most important information I should know about LAGEVRIO? LAGEVRIO may cause serious side effects, including:  LAGEVRIO may cause harm to your unborn baby. It is not known if LAGEVRIO will harm your baby if you take LAGEVRIO during pregnancy. o LAGEVRIO is not recommended for use in pregnancy. o LAGEVRIO has not been studied in pregnancy. LAGEVRIO was studied in pregnant animals only. When LAGEVRIO was given to pregnant animals, LAGEVRIO caused harm to their unborn babies. o You and your healthcare provider may decide that you should take LAGEVRIO during pregnancy if there are no other COVID-19 treatment options approved or authorized by the FDA that are accessible or clinically appropriate for you. o If you and your healthcare provider decide that you should take LAGEVRIO during pregnancy, you and your healthcare provider should discuss the known and potential benefits and the potential risks of taking LAGEVRIO during pregnancy. For individuals who are able to become pregnant:  You should use a reliable method of birth control (contraception) consistently and correctly during treatment with LAGEVRIO and for 4 days after the last dose of LAGEVRIO. Talk to your healthcare provider about reliable birth control methods.  Before starting treatment with [facility] your healthcare provider may do a pregnancy test to see if you are pregnant before starting treatment with LAGEVRIO.  Tell your healthcare provider right away if you become pregnant or think  you may be pregnant during treatment with LAGEVRIO. Pregnancy Surveillance Program:  There is a pregnancy surveillance program for individuals who take LAGEVRIO during pregnancy. The purpose of this program is to collect information about the health of you and your baby. Talk to your healthcare provider about how to take part in this program.  If you take LAGEVRIO during pregnancy and you agree to participate in the  pregnancy surveillance program and allow your healthcare provider to share your information with [employer], then your healthcare provider will report your use of LAGEVRIO during pregnancy to [employer] by calling [phone] or RelayImage.ca. For individuals who are sexually active with partners who are able to become pregnant:  It is not known if LAGEVRIO can affect sperm. While the risk is regarded as low, animal studies to fully assess the potential for LAGEVRIO to affect the babies of males treated with LAGEVRIO have not been completed. A reliable method of birth control (contraception) should be used consistently and correctly during treatment with LAGEVRIO and for at least 3 months after the last dose. The risk to sperm beyond 3 months is not known. Studies to understand the risk to sperm beyond 3 months are ongoing. Talk to your healthcare provider about reliable birth control methods. Talk to your healthcare provider if you have questions or concerns about how LAGEVRIO may affect sperm. You are being given this fact sheet because your healthcare provider believes it is necessary to provide you with LAGEVRIO for the treatment of adults with mild-to-moderate coronavirus disease 2019 (COVID-19) with positive results of direct SARS-CoV-2 viral testing, and who are at high risk for progression to severe COVID-19 including hospitalization or death, and for whom other COVID-19 treatment options approved or authorized by the FDA are not accessible or clinically appropriate. The U.S. Food and Drug Administration (FDA) has issued an Emergency Use Authorization (EUA) to make LAGEVRIO available during the COVID-19 pandemic (for more details about an EUA please see What is an Emergency Use Authorization? at the end of this document). LAGEVRIO is not an FDA-approved medicine in the [place]. Read this Fact Sheet for information about LAGEVRIO. Talk  to your healthcare provider about your options if you have any questions. It is your choice to take LAGEVRIO.  What is COVID-19? COVID-19 is caused by a virus called a coronavirus. You can get COVID-19 through close contact with another person who has the virus. COVID-19 illnesses have ranged from very mild-to-severe, including illness resulting in death. While information so far suggests that most COVID-19 illness is mild, serious illness can happen and may cause some of your other medical conditions to become worse. Older people and people of all ages with severe, long lasting (chronic) medical conditions like heart disease, lung disease and diabetes, for example seem to be at higher risk of being hospitalized for COVID-19.  What is LAGEVRIO? LAGEVRIO is an investigational medicine used to treat mild-to-moderate COVID-19 in adults:  with positive results of direct SARS-CoV-2 viral testing, and  who are at high risk for progression to severe COVID-19 including hospitalization or death, and for whom other COVID-19 treatment options approved or authorized by the FDA are not accessible or clinically appropriate. The FDA has authorized the emergency use of LAGEVRIO for the treatment of mild-tomoderate COVID-19 in adults under an EUA. For more information on EUA, see the What is an Emergency Use Authorization (EUA)? section at the end of this Fact Sheet. LAGEVRIO is not authorized:  for use in people less than 51 years of age.  for prevention of COVID-19.  for people needing hospitalization for COVID-19.  for use for longer than 5 consecutive days.  What should I tell my healthcare provider before I take LAGEVRIO? Tell your healthcare provider if you:  Have any allergies  Are breastfeeding or plan to breastfeed  Have any serious illnesses  Are taking any medicines (prescription, over-the-counter, vitamins, or herbal products).  How do I take LAGEVRIO?  Take LAGEVRIO  exactly as your healthcare provider tells you to take it.  Take 4 capsules of LAGEVRIO every 12 hours (for example, at 8 am and at 8 pm)  Take LAGEVRIO for 5 days. It is important that you complete the full 5 days of treatment with LAGEVRIO. Do not stop taking LAGEVRIO before you complete the full 5 days of treatment, even if you feel better.  Take LAGEVRIO with or without food.  You should stay in isolation for as long as your healthcare provider tells you to. Talk to your healthcare provider if you are not sure about how to properly isolate while you have COVID-19.  Swallow LAGEVRIO capsules whole. Do not open, break, or crush the capsules. If you cannot swallow capsules whole, tell your healthcare provider.  What to do if you miss a dose: o If it has been less than 10 hours since the missed dose, take it as soon as you remember o If it has been more than 10 hours since the missed dose, skip the missed dose and take your dose at the next scheduled time.  Do not double the dose of LAGEVRIO to make up for a missed dose.  What are the important possible side effects of LAGEVRIO?  See, What is the most important information I should know about LAGEVRIO?  Allergic Reactions. Allergic reactions can happen in people taking LAGEVRIO, even after only 1 dose. Stop taking LAGEVRIO and call your healthcare provider right away if you get any of the following symptoms of an allergic reaction: o hives o rapid heartbeat o trouble swallowing or breathing o swelling of the mouth, lips, or face o throat tightness o hoarseness o skin rash The most common side effects of LAGEVRIO are:  diarrhea  nausea  dizziness These are not all the possible side effects of LAGEVRIO. Not many people have taken LAGEVRIO. Serious and unexpected side effects may happen. This medicine is still being studied, so it is possible that all of the risks are not known at this time.  What other treatment  choices are there?  Veklury (remdesivir) is FDA-approved as an intravenous (IV) infusion for the treatment of mildto-moderate COVID-19 in certain adults and children. Talk with your doctor to see if Megan May is appropriate for you. Like LAGEVRIO, FDA may also allow for the emergency use of other medicines to treat people with COVID-19. Go to http://www.austin-beck.biz/ for more information. It is your choice to be treated or not to be treated with LAGEVRIO. Should you decide not to take it, it will not change your standard medical care.  What if I am breastfeeding? Breastfeeding is not recommended during treatment with LAGEVRIO and for 4 days after the last dose of LAGEVRIO. If you are breastfeeding or plan to breastfeed, talk to your healthcare provider about your options and specific situation before taking LAGEVRIO.  How do I report side effects with LAGEVRIO? Contact your healthcare provider if you have any side effects that bother you or do not go away.  Report side effects to FDA MedWatch at MacRetreat.be or call 1-800-FDA-1088 (5122331912).  How should I store LAGEVRIO?  Store LAGEVRIO capsules at room temperature between 65F to 91F (20C to 25C).  Keep LAGEVRIO and all medicines out of the reach of children and pets. How can I learn more about COVID-19?  Ask your healthcare provider.  Visit IndexCrawler.co.za  Contact your local or state public health department.  Call [employer] at [phone] (toll free in the [place]  Visit www.molnupiravir.com  What Is an Emergency Use Authorization (EUA)? The [place] FDA has made LAGEVRIO available under an emergency access mechanism called an Emergency Use Authorization (EUA) The EUA is supported by a Insurance account manager Health and Human Service (HHS) declaration that circumstances exist to justify emergency use  of drugs and biological products during the COVID-19 pandemic. LAGEVRIO for the treatment of mild-to-moderate COVID-19 in adults with positive results of direct SARS-CoV-2 viral testing, who are at high risk for progression to severe COVID-19, including hospitalization or death, and for whom alternative COVID-19 treatment options approved or authorized by FDA are not accessible or clinically appropriate, has not undergone the same type of review as an FDA-approved product. In issuing an EUA under the COVID-19 public health emergency, the FDA has determined, among other things, that based on the total amount of scientific evidence available including data from adequate and well-controlled clinical trials, if available, it is reasonable to believe that the product may be effective for diagnosing, treating, or preventing COVID-19, or a serious or life-threatening disease or condition caused by COVID-19; that the known and potential benefits of the product, when used to diagnose, treat, or prevent such disease or condition, outweigh the known and potential risks of such product; and that there are no adequate, approved, and available alternatives.  All of these criteria must be met to allow for the product to be used in the treatment of patients during the COVID-19 pandemic. The EUA for LAGEVRIO is in effect for the duration of the COVID-19 declaration justifying emergency use of LAGEVRIO, unless terminated or revoked (after which LAGEVRIO may no longer be used under the EUA). For patent information: LeaseGuru.tn Copyright  2021-2022 [employer] [place], [place] and its affiliates. All rights reserved. [id number] Revised: March 2022

## 2021-01-07 NOTE — Telephone Encounter (Signed)
Appointment made for today

## 2021-01-16 ENCOUNTER — Encounter: Payer: Self-pay | Admitting: Family Medicine

## 2021-03-23 ENCOUNTER — Encounter: Payer: Self-pay | Admitting: Family Medicine

## 2021-03-24 ENCOUNTER — Telehealth: Payer: Self-pay | Admitting: Family Medicine

## 2021-03-24 ENCOUNTER — Other Ambulatory Visit: Payer: Self-pay | Admitting: Family Medicine

## 2021-03-24 MED ORDER — POLYMYXIN B-TRIMETHOPRIM 10000-0.1 UNIT/ML-% OP SOLN: OPHTHALMIC | 0 refills | Status: DC

## 2021-03-24 NOTE — Telephone Encounter (Signed)
Pt is calling back states she can not get in to be seen today because there were no openings her eye infection has gotten worse she can never see her doctor. States Dr. B has always provided her with the rx for her eye infection. Because she can't come in tomorrow due to her job requesting that Dr. B please review her chart and send her in a rx to [facility]  on [address].

## 2021-03-24 NOTE — Telephone Encounter (Signed)
Patient is calling about her eye infection, she's tried an old prescription but they are not working. She would like something to be prescribed since it has now spread to the other eye as well.She believes it is a stye. She will make an appointment if needed. Please advice

## 2021-03-25 NOTE — Telephone Encounter (Signed)
Pt aware of time

## 2021-03-26 ENCOUNTER — Telehealth (INDEPENDENT_AMBULATORY_CARE_PROVIDER_SITE_OTHER): Payer: BC Managed Care – PPO | Admitting: Family Medicine

## 2021-03-26 ENCOUNTER — Other Ambulatory Visit: Payer: Self-pay

## 2021-03-26 DIAGNOSIS — H01009 Unspecified blepharitis unspecified eye, unspecified eyelid: Secondary | ICD-10-CM | POA: Insufficient documentation

## 2021-03-26 DIAGNOSIS — H0100A Unspecified blepharitis right eye, upper and lower eyelids: Secondary | ICD-10-CM | POA: Diagnosis not present

## 2021-03-26 MED ORDER — DOXYCYCLINE HYCLATE 100 MG PO TABS: 100.0000 mg | ORAL_TABLET | Freq: Two times a day (BID) | ORAL | 0 refills | Status: DC

## 2021-03-26 NOTE — Progress Notes (Signed)
MyChart Video Visit    Virtual Visit via Video Note   This visit type was conducted due to national recommendations for restrictions regarding the COVID-19 Pandemic (e.g. social distancing) in an effort to limit this patient's exposure and mitigate transmission in our community. This patient is at least at moderate risk for complications without adequate follow up. This format is felt to be most appropriate for this patient at this time. Physical exam was limited by quality of the video and audio technology used for the visit. S Chism, CMA was able to get the patient set up on a video visit.  Patient location: home Patient and provider in visit Provider location: Office  I discussed the limitations of evaluation and management by telemedicine and the availability of in person appointments. The patient expressed understanding and agreed to proceed.  Visit Date: 03/26/2021  Today's healthcare provider: Penni Homans, MD     Subjective:    Patient ID: Megan May, female    DOB: Dec 06, 1960, 60 y.o.   MRN: [id number]  Chief Complaint  Patient presents with   eye issues    HPI Patient is in today for evaluation of eye infection. She started experiencing floaters in her right eye a couple of weeks ago and is now being treated for that by a Retinal specialist but now has developed swelling, pain in upper and lower eye lid in right eye. She has tried warm compresses and antibiotic drops and it continues to worsen. Denies CP/palp/SOB/HA/congestion/fevers/GI or GU c/o. Taking meds as prescribed   Past Medical History:  Diagnosis Date   Anxiety    Asthma    Depression    history of    Eosinophilic esophagitis    Fatigue 07/02/2017   Generalized anxiety disorder    Hyperglycemia 03/15/2016   Hyperlipidemia, mixed 06/30/2016   Hypersomnia, recurrent    Hypertension    Migraine    Migraines    Posttraumatic stress disorder     Past Surgical History:  Procedure  Laterality Date   CESAREAN SECTION  2004   ESOPHAGOGASTRODUODENOSCOPY (EGD) WITH PROPOFOL N/A 01/12/2019   Procedure: ESOPHAGOGASTRODUODENOSCOPY (EGD) WITH PROPOFOL;  Surgeon: Carol Ada, MD;  Location: [facility];  Service: Endoscopy;  Laterality: N/A;   FRACTURE SURGERY  1973   R arm   LAPAROTOMY Bilateral 06/14/2014   Procedure: LAPAROTOMY with right  salpingo-oophorectomy and left salpingectomy;  Surgeon: Linda Hedges, DO;  Location: [facility];  Service: Gynecology;  Laterality: Bilateral;   TONSILLECTOMY AND ADENOIDECTOMY  1969    Family History  Problem Relation Age of Onset   Hypertension Father    Heart disease Father    Diabetes Father    CAD Father    Stroke Maternal Grandfather    Stroke Maternal Grandmother    Diabetes Paternal Grandmother    Other Cousin        bicuspid aortic valve   Diabetes Brother     Social History   Socioeconomic History   Marital status: Married    Spouse name: Randall Hiss   Number of children: 1   Years of education: 2 Masters   Highest education level: Not on file  Occupational History   Occupation: SPECIAL EDUCATOR    Employer: [employer]    Comment: Teacher, early years/pre  Tobacco Use   Smoking status: Never   Smokeless tobacco: Never  Substance and Sexual Activity   Alcohol use: No   Drug use: No   Sexual activity: Yes    Birth  control/protection: Surgical  Other Topics Concern   Not on file  Social History Narrative   Patient is right handed, resides in home with husband and daughter. She consumes 16 oz of tea daily.   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   Social Determinants of Health   Financial Resource Strain: Not on file  Food Insecurity: Not on file  Transportation Needs: Not on file  Physical Activity: Not on file  Stress: Not on file  Social Connections: Not on file  Intimate Partner Violence: Not on file    Outpatient Medications Prior to Visit  Medication Sig Dispense Refill    albuterol (PROVENTIL HFA;VENTOLIN HFA) 108 (90 Base) MCG/ACT inhaler Inhale 1-2 puffs into the lungs every 6 (six) hours as needed for wheezing or shortness of breath. 18 g 5   beclomethasone (QVAR REDIHALER) 80 MCG/ACT inhaler Inhale 2 puffs into the lungs 2 (two) times daily. 1 Inhaler 0   furosemide (LASIX) 20 MG tablet Take 1 tablet (20 mg total) by mouth as needed. 90 tablet 1   lisinopril (ZESTRIL) 20 MG tablet Take 1 tablet (20 mg total) by mouth daily. 90 tablet 3   omeprazole (PRILOSEC) 40 MG capsule Take 40 mg by mouth daily.     trimethoprim-polymyxin b (POLYTRIM) ophthalmic solution 1-2 drops to affected eye tid x 7 days 10 mL 0   No facility-administered medications prior to visit.    Allergies  Allergen Reactions   Amoxicillin-Pot Clavulanate Rash   Levaquin [Levofloxacin In D5w] Other (See Comments)    tendonopathy   Clindamycin Itching   Clindamycin/Lincomycin Itching   Lactose Intolerance (Gi) Nausea Only   Metoprolol     Weight gain/fluid retention    Penicillin G Other (See Comments)   Aspirin Other (See Comments)    Migraine headache   Latex Rash   Penicillins Swelling and Rash    Did it involve swelling of the face/tongue/throat, SOB, or low BP? No Did it involve sudden or severe rash/hives, skin peeling, or any reaction on the inside of your mouth or nose? Yes Did you need to seek medical attention at a hospital or doctor's office? No When did it last happen?      60 yo If all above answers are "NO", may proceed with cephalosporin use.    Review of Systems  Constitutional:  Negative for fever and malaise/fatigue.  HENT:  Negative for congestion and ear discharge.   Eyes:  Negative for blurred vision, double vision, photophobia, pain, discharge and redness.       Eyelids are swollen and tender  Respiratory:  Negative for shortness of breath.   Cardiovascular:  Negative for chest pain, palpitations and leg swelling.  Gastrointestinal:  Negative for abdominal  pain, blood in stool and nausea.  Genitourinary:  Negative for dysuria and frequency.  Musculoskeletal:  Negative for falls.  Skin:  Negative for rash.  Neurological:  Negative for dizziness, loss of consciousness and headaches.  Endo/Heme/Allergies:  Negative for environmental allergies.  Psychiatric/Behavioral:  Negative for depression. The patient is not nervous/anxious.       Objective:    Physical Exam Constitutional:      General: She is not in acute distress.    Appearance: Normal appearance. She is not ill-appearing or toxic-appearing.  HENT:     Head: Normocephalic and atraumatic.     Right Ear: External ear normal.     Left Ear: External ear normal.     Nose: Nose normal.  Eyes:     General: No scleral icterus.       Right eye: No discharge.        Left eye: No discharge.     Extraocular Movements: Extraocular movements intact.     Pupils: Pupils are equal, round, and reactive to light.     Comments: Right eye upper and lower eyelids swollen and erythematous. Swelling is tracking into right cheek. No obvious discharge or injection of eye itselff  Pulmonary:     Effort: Pulmonary effort is normal.  Skin:    Findings: No rash.  Neurological:     Mental Status: She is alert and oriented to person, place, and time.  Psychiatric:        Behavior: Behavior normal.    LMP 05/10/2014 (Approximate)  Wt Readings from Last 3 Encounters:  10/10/19 185 lb (83.9 kg)  01/12/19 177 lb 0.5 oz (80.3 kg)  12/23/18 178 lb 3.2 oz (80.8 kg)    Diabetic Foot Exam - Simple   No data filed    Lab Results  Component Value Date   WBC 10.0 01/12/2019   HGB 14.8 01/12/2019   HCT 44.3 01/12/2019   PLT 207 01/12/2019   GLUCOSE 94 01/12/2019   CHOL 199 06/18/2016   TRIG 194.0 (H) 06/18/2016   HDL 61.00 06/18/2016   LDLCALC 99 06/18/2016   ALT 12 06/18/2016   AST 17 06/18/2016   NA 144 01/12/2019   K 3.5 01/12/2019   CL 107 01/12/2019   CREATININE 0.74 01/12/2019   BUN 24  (H) 01/12/2019   CO2 25 01/12/2019   TSH 1.510 07/02/2017   HGBA1C 5.3 06/18/2016    Lab Results  Component Value Date   TSH 1.510 07/02/2017   Lab Results  Component Value Date   WBC 10.0 01/12/2019   HGB 14.8 01/12/2019   HCT 44.3 01/12/2019   MCV 86.9 01/12/2019   PLT 207 01/12/2019   Lab Results  Component Value Date   NA 144 01/12/2019   K 3.5 01/12/2019   CO2 25 01/12/2019   GLUCOSE 94 01/12/2019   BUN 24 (H) 01/12/2019   CREATININE 0.74 01/12/2019   BILITOT 0.8 06/18/2016   ALKPHOS 74 06/18/2016   AST 17 06/18/2016   ALT 12 06/18/2016   PROT 6.7 06/18/2016   ALBUMIN 4.4 06/18/2016   CALCIUM 10.0 01/12/2019   ANIONGAP 12 01/12/2019   GFR 78.17 06/18/2016   Lab Results  Component Value Date   CHOL 199 06/18/2016   Lab Results  Component Value Date   HDL 61.00 06/18/2016   Lab Results  Component Value Date   LDLCALC 99 06/18/2016   Lab Results  Component Value Date   TRIG 194.0 (H) 06/18/2016   Lab Results  Component Value Date   CHOLHDL 3 06/18/2016   Lab Results  Component Value Date   HGBA1C 5.3 06/18/2016       Assessment & Plan:   Problem List Items Addressed This Visit     Blepharitis - Primary    She has been struggling with symptoms that are worsening for over 2 weeks now with swelling and discomfort in upper and lower lids and now spreading onto cheek. She has been using antibiotic eye drops and her eye itself is just scratchy. She is already seeing a retinal specialist for the recent development of floaters but they do not treat infections so she is also referred to opthamology and prescribed Doxycycline 100 mg po bid. Continue warm compresses  Relevant Orders   Ambulatory referral to Ophthalmology    I am having Zachery Dauer. Malone-Birnbach start on doxycycline. I am also having her maintain her albuterol, beclomethasone, omeprazole, lisinopril, furosemide, and trimethoprim-polymyxin b.  Meds ordered this encounter   Medications   doxycycline (VIBRA-TABS) 100 MG tablet    Sig: Take 1 tablet (100 mg total) by mouth 2 (two) times daily.    Dispense:  20 tablet    Refill:  0    I discussed the assessment and treatment plan with the patient. The patient was provided an opportunity to ask questions and all were answered. The patient agreed with the plan and demonstrated an understanding of the instructions.   The patient was advised to call back or seek an in-person evaluation if the symptoms worsen or if the condition fails to improve as anticipated.  I provided 12 minutes of face-to-face time during this encounter.   Penni Homans, MD [facility] [phone] (phone) [phone] (fax)  [place]

## 2021-03-26 NOTE — Assessment & Plan Note (Addendum)
She has been struggling with symptoms that are worsening for over 2 weeks now with swelling and discomfort in upper and lower lids and now spreading onto cheek. She has been using antibiotic eye drops and her eye itself is just scratchy. She is already seeing a retinal specialist for the recent development of floaters but they do not treat infections so she is also referred to opthamology and prescribed Doxycycline 100 mg po bid. Continue warm compresses

## 2021-05-01 ENCOUNTER — Other Ambulatory Visit: Payer: Self-pay

## 2021-05-01 ENCOUNTER — Ambulatory Visit: Payer: Self-pay

## 2021-05-01 ENCOUNTER — Ambulatory Visit: Payer: BC Managed Care – PPO | Admitting: Family Medicine

## 2021-05-01 VITALS — BP 162/102 | HR 77 | Ht 67.0 in

## 2021-05-01 DIAGNOSIS — M79672 Pain in left foot: Secondary | ICD-10-CM | POA: Diagnosis not present

## 2021-05-01 DIAGNOSIS — S92355A Nondisplaced fracture of fifth metatarsal bone, left foot, initial encounter for closed fracture: Secondary | ICD-10-CM | POA: Insufficient documentation

## 2021-05-01 NOTE — Patient Instructions (Signed)
Thank you for coming in today.   Recheck in about 2 weeks.   Ok to use tramadol for severe pain.   Ok to use diclofenac. Its like ibuprofen.   Ok to take all of that with tylenol.   Please go to [facility] supply to get the knee scooter we talked about today. You may also be able to get it from [employer].    Let me know sooner if this is not working.

## 2021-05-01 NOTE — Progress Notes (Signed)
I, Peterson Lombard, LAT, ATC acting as a scribe for Lynne Leader, MD.  Subjective:    CC: L ankle and foot pain  HPI: Pt is a 60 y/o female c/o L foot pain ongoing since this morning. MOI: Pt "twisted" her L ankle this morning when a student was wrestling w/ her, into PF and INV. Pt works as a Chief Technology Officer. Pt was seen at the [facility] early today and was given a walking boot and prescribed tramadol and diclofenac. Pt locates pain to along the lateral aspect of foot and ankle. Pt reports increased pain when trying to wear the boot. Pt is unsure on her plan of care and would like to discuss.    L foot swelling: no Treatments tried: none  Dx imaging: 05/01/21 done at [facility]  Pertinent review of Systems: No fevers or chills  Relevant historical information: Cervical spine injury.  Hypertension.   Objective:    Vitals:   05/01/21 1404  BP: (!) 162/102  Pulse: 77  SpO2: 98%   General: Well Developed, well nourished, and in no acute distress.   MSK: Right foot and ankle swollen at the ankle and midfoot.  Tender palpation ATFL region and proximal fifth metatarsal.  Motion not tested. Pulses cap refill and sensation intact distally.  Lab and Radiology Results EXAM: LEFT ANKLE COMPLETE - 3+ VIEW; LEFT FOOT - COMPLETE 3+ VIEW  COMPARISON: None.  FINDINGS: There is an oblique, minimally displaced fracture of the left fifth metatarsal styloid. No other fracture or dislocation of the left foot or left ankle. Joint spaces are well preserved. Soft tissue edema about the lateral foot.  IMPRESSION: There is an oblique, minimally displaced fracture of the left fifth metatarsal styloid. No other fracture or dislocation of the left foot or left ankle.  Electronically Signed By: Delanna Ahmadi M.D. On: 05/01/2021 11:43  XR ANKLE LEFT (ROUTINE: AP,LAT,OBL)  Narrative  CLINICAL DATA: Twisted foot, pain  EXAM: LEFT ANKLE COMPLETE - 3+ VIEW; LEFT FOOT -  COMPLETE 3+ VIEW  COMPARISON: None.  FINDINGS: There is an oblique, minimally displaced fracture of the left fifth metatarsal styloid. No other fracture or dislocation of the left foot or left ankle. Joint spaces are well preserved. Soft tissue edema about the lateral foot.  IMPRESSION: There is an oblique, minimally displaced fracture of the left fifth metatarsal styloid. No other fracture or dislocation of the left foot or left ankle.  Electronically Signed By: Delanna Ahmadi M.D. On: 05/01/2021 11:43     Impression and Recommendations:    Assessment and Plan: 60 y.o. female with left foot proximal fifth metatarsal fracture.  Patient appropriately treated with CAM Walker boot and crutches with weightbearing as tolerated and nonweightbearing.  Additionally she was prescribed tramadol and diclofenac.  We will recommend also using a knee scooter and advised return to work in 1 to 3 days when tolerated.  Recheck fracture in 2 weeks with repeat x-ray and return to clinic.  Certainly can reassess work situation on Monday if its not going well.Marland Kitchen  PDMP not reviewed this encounter. Orders Placed This Encounter  Procedures   [place] LIMITED JOINT SPACE STRUCTURES LOW LEFT(NO LINKED CHARGES)    Standing Status:   Future    Number of Occurrences:   1    Standing Expiration Date:   10/29/2021    Order Specific Question:   Reason for Exam (SYMPTOM  OR DIAGNOSIS REQUIRED)    Answer:   left foot  pain    Order Specific Question:   Preferred imaging location?    Answer:   [place]   No orders of the defined types were placed in this encounter.   Discussed warning signs or symptoms. Please see discharge instructions. Patient expresses understanding.   The above documentation has been reviewed and is accurate and complete Lynne Leader, M.D.

## 2021-05-05 ENCOUNTER — Telehealth: Payer: Self-pay | Admitting: Family Medicine

## 2021-05-05 ENCOUNTER — Encounter: Payer: Self-pay | Admitting: Family Medicine

## 2021-05-05 NOTE — Telephone Encounter (Signed)
Patient called stating that she was told that if she needed another note for work to let us know. She will need one excusing her for work missed today.  Please email to: [email or web address]

## 2021-05-05 NOTE — Telephone Encounter (Signed)
Letter emailed

## 2021-05-05 NOTE — Telephone Encounter (Signed)
Letter written will be sent

## 2021-05-15 ENCOUNTER — Other Ambulatory Visit: Payer: Self-pay

## 2021-05-15 ENCOUNTER — Ambulatory Visit: Payer: BC Managed Care – PPO | Admitting: Family Medicine

## 2021-05-15 ENCOUNTER — Ambulatory Visit (INDEPENDENT_AMBULATORY_CARE_PROVIDER_SITE_OTHER): Payer: BC Managed Care – PPO

## 2021-05-15 VITALS — BP 158/100 | HR 66 | Ht 67.0 in | Wt 190.0 lb

## 2021-05-15 DIAGNOSIS — M79672 Pain in left foot: Secondary | ICD-10-CM | POA: Diagnosis not present

## 2021-05-15 DIAGNOSIS — S92355D Nondisplaced fracture of fifth metatarsal bone, left foot, subsequent encounter for fracture with routine healing: Secondary | ICD-10-CM | POA: Diagnosis not present

## 2021-05-15 NOTE — Patient Instructions (Signed)
Thank you for coming in today.   Please get an Xray today before you leave   Check back in 2 weeks 

## 2021-05-15 NOTE — Progress Notes (Signed)
I, Peterson Lombard, LAT, ATC acting as a scribe for Lynne Leader, MD.  Megan May is a 60 y.o. 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 Pt was last seen by Dr. Georgina Snell 05/01/21 and was advised to wear a CAM walker boot, crutches, and weightbearing as tolerated. Pt also recommended to use a knee scooter and return to work in 1-3 days when tolerable. Today, pt reports more pain than expected along the lateral aspect of the ankle and over the distal fibula. Pt has been compliant w/ wearing the boot and uses the knee scooter when her environment permits.   Dx imaging: 05/01/21 L foot & ankle XR (@ [facility])  Pertinent review of systems: No fevers or chills  Relevant historical information: Hypertension   Exam:  BP (!) 158/100   Pulse 66   Ht 5\' 7"  (1.702 m)   Wt 190 lb (86.2 kg)   LMP 05/10/2014 (Approximate)   SpO2 98%   BMI 29.76 kg/m  General: Well Developed, well nourished, and in no acute distress.   MSK: Left foot and ankle slightly swollen. Mildly tender palpation proximal fifth metatarsal. Moderate tender palpation posterior aspect of lateral malleolus.  Nontender on the malleolus itself. Foot and ankle motion not tested. Pulses cap refill and sensation are intact distally.    Lab and Radiology Results  X-ray images left foot and ankle obtained today personally and independently interpreted  Left ankle: Possible avulsion from talus seen on oblique view.  Avulsion fragment is tiny a sliver.   Left foot: Avulsion fracture proximal fifth metatarsal with minimal displacement.  Await formal radiology review     Assessment and Plan: 60 y.o. female with fifth metatarsal fracture.  Clinically significantly improving.  Patient also has evidence of a ATFL type  ankle avulsion.  Her pain in her ankle is more along the course of the peroneal tendons.  Plan to recheck in 2 weeks.  Continue CAM Walker boot and advance weightbearing as tolerated.   PDMP not reviewed this encounter. Orders Placed This Encounter  Procedures   DG Foot Complete Left    Standing Status:   Future    Number of Occurrences:   1    Standing Expiration Date:   05/15/2022    Order Specific Question:   Reason for Exam (SYMPTOM  OR DIAGNOSIS REQUIRED)    Answer:   left foot pain    Order Specific Question:   Preferred imaging location?    Answer:   [facility]    Order Specific Question:   Is patient pregnant?    Answer:   No   DG Ankle Complete Left    Standing Status:   Future    Number of Occurrences:   1    Standing Expiration Date:   05/15/2022    Order Specific Question:   Reason for Exam (SYMPTOM  OR DIAGNOSIS REQUIRED)    Answer:   left ankle pain    Order Specific Question:   Preferred imaging location?    Answer:   [facility]    Order Specific Question:   Is patient pregnant?    Answer:   No   No orders of the defined types were placed in this encounter.    Discussed warning signs or symptoms.  Please see discharge instructions. Patient expresses understanding.   [email or web address]

## 2021-05-20 NOTE — Progress Notes (Signed)
We do see the fracture at the base of the fifth metatarsal but otherwise no other foot or ankle fractures are present.

## 2021-05-23 ENCOUNTER — Telehealth (HOSPITAL_BASED_OUTPATIENT_CLINIC_OR_DEPARTMENT_OTHER): Payer: Self-pay | Admitting: Cardiovascular Disease

## 2021-05-23 MED ORDER — LISINOPRIL 20 MG PO TABS: 20.0000 mg | ORAL_TABLET | Freq: Every day | ORAL | 0 refills | Status: DC

## 2021-05-23 NOTE — Telephone Encounter (Signed)
Rx request sent to pharmacy. Pt has appointment coming up with Dr. Oval Linsey on 07/10/21. Sent refill for 90 tab with 0 refills.

## 2021-05-23 NOTE — Telephone Encounter (Signed)
*  STAT* If patient is at the pharmacy, call can be transferred to refill team.   1. Which medications need to be refilled? (please list name of each medication and dose if known) lisinopril (ZESTRIL) 20 MG tablet  2. Which pharmacy/location (including street and city if local pharmacy) is medication to be sent to? [id number] - [place], [place] - [address]  3. Do they need a 30 day or 90 day supply? [place]

## 2021-05-29 ENCOUNTER — Ambulatory Visit: Payer: BC Managed Care – PPO | Admitting: Family Medicine

## 2021-05-29 ENCOUNTER — Ambulatory Visit (INDEPENDENT_AMBULATORY_CARE_PROVIDER_SITE_OTHER): Payer: BC Managed Care – PPO

## 2021-05-29 ENCOUNTER — Other Ambulatory Visit: Payer: Self-pay

## 2021-05-29 VITALS — BP 142/88 | HR 69 | Ht 67.0 in | Wt 189.2 lb

## 2021-05-29 DIAGNOSIS — S92355D Nondisplaced fracture of fifth metatarsal bone, left foot, subsequent encounter for fracture with routine healing: Secondary | ICD-10-CM | POA: Diagnosis not present

## 2021-05-29 NOTE — Patient Instructions (Addendum)
Thank you for coming in today.   OK to return the knee scooter.  Continue wearing the CAM walker boot   Recheck in 2 weeks  Will repeat foot XR only at that point

## 2021-05-29 NOTE — Progress Notes (Signed)
I, Peterson Lombard, LAT, ATC acting as a scribe for Lynne Leader, MD.  Megan May is a 60 y.o. 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 Pt was last seen by Dr. Georgina Snell on 05/15/21 and was advised to cont CAM walker boot and advance weightbearing as tolerated. Today, pt reports some improvement in her L foot. Pt has been wearing her boot, but has been able to bear weight on her L foot.  Dx imaging: 05/15/21 L foot & ankle XR 05/01/21 L foot & ankle XR (@ [facility])  Pertinent review of systems: No fevers or chills  Relevant historical information: Hypertension   Exam:  BP (!) 142/88   Pulse 69   Ht 5\' 7"  (1.702 m)   Wt 189 lb 3.2 oz (85.8 kg)   LMP 05/10/2014 (Approximate)   SpO2 99%   BMI 29.63 kg/m  General: Well Developed, well nourished, and in no acute distress.   MSK: Left foot and ankle mildly swollen.  No significant bruising visible. Decreased ankle motion. Tender palpation proximal fifth metatarsal. Ankle is nontender. Pulses capillary fill and sensation are intact distally.    Lab and Radiology Results  X-ray images left foot and ankle obtained today personally and independently interpreted.  Left ankle: No visible fractures present.  Left foot: Proximal fifth metatarsal avulsion fracture.  Nondisplaced.  Early periosteal reabsorption from fracture site without significant displacement or bony bridging.  Await formal radiology review   Assessment and Plan: 60 y.o. female with left proximal fifth metatarsal fracture.  Avulsion type.  Fracture occurred on September 29.  Plan to continue immobilization with Cam walker boot.  She is not having pain with ambulation with cam walker boot now.  Recheck in 2 weeks.  At the next visit plan only for foot  x-ray.  Hopefully will be able to discontinue the boot in the next 2 to 4 weeks.   PDMP not reviewed this encounter. Orders Placed This Encounter  Procedures   DG Foot Complete Left    Standing Status:   Future    Number of Occurrences:   1    Standing Expiration Date:   05/29/2022    Order Specific Question:   Reason for Exam (SYMPTOM  OR DIAGNOSIS REQUIRED)    Answer:   left foot pain    Order Specific Question:   Preferred imaging location?    Answer:   [facility]    Order Specific Question:   Is patient pregnant?    Answer:   No   DG Ankle Complete Left    Standing Status:   Future    Number of Occurrences:   1    Standing Expiration Date:   05/29/2022    Order Specific Question:   Reason for Exam (SYMPTOM  OR DIAGNOSIS REQUIRED)    Answer:   left ankle pain    Order Specific Question:   Preferred imaging location?    Answer:   [facility]    Order Specific Question:   Is patient pregnant?    Answer:   No   No orders of the defined types were placed in this encounter.    Discussed warning signs or symptoms. Please see discharge instructions. Patient expresses understanding.   The above documentation has been  reviewed and is accurate and complete Lynne Leader, M.D.

## 2021-06-02 NOTE — Progress Notes (Signed)
Left foot x-ray shows stable appearing fracture at the fifth metatarsal in the foot.

## 2021-06-02 NOTE — Progress Notes (Signed)
Left ankle x-ray shows stable appearing fracture at the base of the fifth metatarsal left foot.

## 2021-06-10 ENCOUNTER — Ambulatory Visit (INDEPENDENT_AMBULATORY_CARE_PROVIDER_SITE_OTHER): Payer: BC Managed Care – PPO | Admitting: Professional

## 2021-06-10 DIAGNOSIS — F411 Generalized anxiety disorder: Secondary | ICD-10-CM | POA: Diagnosis not present

## 2021-06-11 ENCOUNTER — Ambulatory Visit (INDEPENDENT_AMBULATORY_CARE_PROVIDER_SITE_OTHER): Payer: BC Managed Care – PPO

## 2021-06-11 ENCOUNTER — Other Ambulatory Visit: Payer: Self-pay

## 2021-06-11 ENCOUNTER — Ambulatory Visit: Payer: BC Managed Care – PPO | Admitting: Family Medicine

## 2021-06-11 VITALS — BP 124/78 | HR 59 | Ht 67.0 in | Wt 188.4 lb

## 2021-06-11 DIAGNOSIS — S92355D Nondisplaced fracture of fifth metatarsal bone, left foot, subsequent encounter for fracture with routine healing: Secondary | ICD-10-CM | POA: Diagnosis not present

## 2021-06-11 DIAGNOSIS — M25572 Pain in left ankle and joints of left foot: Secondary | ICD-10-CM | POA: Diagnosis not present

## 2021-06-11 NOTE — Progress Notes (Signed)
   I, Peterson Lombard, LAT, ATC acting as a scribe for Lynne Leader, MD.  Megan May is a 60 y.o. 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 Pt was last seen by Dr. Georgina Snell on 05/29/21 and was advised to cont immobilization w/ CAM walker boot. Today, pt reports improvement in her foot, but notes foot is slightly painful, feeling like a bruise. Pt has cont to wear the boot.  Dx imaging: 05/29/21 L foot & ankle XR 05/15/21 L foot & ankle XR 05/01/21 L foot & ankle XR (@ [facility])  Pertinent review of systems: No fevers or chills yet  Relevant historical information: Hypertension   Exam:  BP 124/78   Pulse (!) 59   Ht 5\' 7"  (1.702 m)   Wt 188 lb 6.4 oz (85.5 kg)   LMP 05/10/2014 (Approximate)   SpO2 99%   BMI 29.51 kg/m  General: Well Developed, well nourished, and in no acute distress.   MSK: Left foot and ankle Normal-appearing Minimally tender palpation proximal fifth metatarsal. Tender palpation ATFL region. Tender and painful with talar tilt testing. Pulses capillary fill and sensation are intact distally.    Lab and Radiology Results  X-ray images left foot obtained today personally and independently interpreted Healing avulsion fracture proximal fifth metatarsal. Await formal radiology review     Assessment and Plan: 60 y.o. female with left proximal fifth metatarsal fracture avulsion type.  Healing per my interpretation of x-ray.  Radiology overread is still pending.  Plan to continue cam walker boot for another 2 weeks and likely at that point on recheck transition to ASO ankle brace.  Additionally given her ankle pain she will benefit from physical therapy.  We will go ahead and refer her now with plan to start PT in 2 weeks.   PDMP not reviewed this  encounter. Orders Placed This Encounter  Procedures   DG Foot Complete Left    Standing Status:   Future    Number of Occurrences:   1    Standing Expiration Date:   06/11/2022    Order Specific Question:   Reason for Exam (SYMPTOM  OR DIAGNOSIS REQUIRED)    Answer:   left foot pain    Order Specific Question:   Preferred imaging location?    Answer:   [facility]    Order Specific Question:   Is patient pregnant?    Answer:   No   Ambulatory referral to Physical Therapy    Referral Priority:   Routine    Referral Type:   Physical Medicine    Referral Reason:   Specialty Services Required    Requested Specialty:   Physical Therapy    Number of Visits Requested:   1   No orders of the defined types were placed in this encounter.    Discussed warning signs or symptoms. Please see discharge instructions. Patient expresses understanding.   The above documentation has been reviewed and is accurate and complete Lynne Leader, M.D.

## 2021-06-11 NOTE — Patient Instructions (Signed)
Thank you for coming in today.   Med Spec ASO Ankle Stabilizer  Recheck in about 2 weeks.   Plan to start PT after the next visit.

## 2021-06-16 NOTE — Progress Notes (Signed)
Fifth metatarsal fracture is not changed.

## 2021-06-24 NOTE — Progress Notes (Signed)
    Megan May is a 60 y.o. 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 Pt was last seen by Dr. Georgina Snell on 06/11/21 and was advised to cont CAM walker boot for another 2 weeks and then will likely transition to an ASO ankle brace. Pt was also referred to PT w/ her first visit scheduled for 07/02/21. Today, pt reports improving pain. Still having intermittent pain over lateral malleolus. Boot has helped pain.  She has purchased an ASO brace.  She is eager to resume walking for exercise.  Physical therapy starts next week.  Dx imaging: 05/29/21 L foot & ankle XR 05/15/21 L foot & ankle XR 05/01/21 L foot & ankle XR (@ [facility])  Pertinent review of systems: No fevers or chills  Relevant historical information: Diverticulosis   Exam:  BP 122/72   Pulse 67   Ht 5\' 7"  (1.702 m)   LMP 05/10/2014 (Approximate)   SpO2 99%   BMI 29.51 kg/m  General: Well Developed, well nourished, and in no acute distress.   MSK: Left foot normal-appearing Tender proximal fifth metatarsal. Slight decreased range of motion foot. Intact strength.    Lab and Radiology Results  X-ray images left foot obtained today personally and independently interpreted. Nonunion proximal fifth metatarsal avulsion fracture.  No change in alignment or displacement. Await formal radiology review     Assessment and Plan: 60 y.o. female with patient suffered a proximal fifth metatarsal fracture on September 29.  Has been about 2 months since the original injury.  She is not having much bony healing on x-ray but clinically is doing quite well.  Plan to advance out of cam walker boot as tolerated into ASO brace.  Recheck in 1 month.  Physical therapy starts November 30 which should be helpful.  Advance activity  as tolerated return sooner if needed.   PDMP not reviewed this encounter. Orders Placed This Encounter  Procedures   DG Foot Complete Left    Standing Status:   Future    Number of Occurrences:   1    Standing Expiration Date:   06/25/2022    Order Specific Question:   Reason for Exam (SYMPTOM  OR DIAGNOSIS REQUIRED)    Answer:   Follow-up fifth metatarsal fracture.    Order Specific Question:   Is patient pregnant?    Answer:   No    Order Specific Question:   Preferred imaging location?    Answer:   [facility]   No orders of the defined types were placed in this encounter.    Discussed warning signs or symptoms. Please see discharge instructions. Patient expresses understanding.   The above documentation has been reviewed and is accurate and complete Lynne Leader, M.D.

## 2021-06-25 ENCOUNTER — Other Ambulatory Visit: Payer: Self-pay

## 2021-06-25 ENCOUNTER — Ambulatory Visit (INDEPENDENT_AMBULATORY_CARE_PROVIDER_SITE_OTHER): Payer: BC Managed Care – PPO

## 2021-06-25 ENCOUNTER — Encounter: Payer: Self-pay | Admitting: Family Medicine

## 2021-06-25 ENCOUNTER — Ambulatory Visit (INDEPENDENT_AMBULATORY_CARE_PROVIDER_SITE_OTHER): Payer: BC Managed Care – PPO | Admitting: Professional

## 2021-06-25 ENCOUNTER — Ambulatory Visit: Payer: BC Managed Care – PPO | Admitting: Family Medicine

## 2021-06-25 VITALS — BP 122/72 | HR 67 | Ht 67.0 in

## 2021-06-25 DIAGNOSIS — S92355D Nondisplaced fracture of fifth metatarsal bone, left foot, subsequent encounter for fracture with routine healing: Secondary | ICD-10-CM

## 2021-06-25 DIAGNOSIS — F411 Generalized anxiety disorder: Secondary | ICD-10-CM

## 2021-06-25 NOTE — Patient Instructions (Signed)
Thank you for coming in today.   OK to use ankle brace without the boot.   Attend PT.   Recheck in 1 month.   Ok to advance activity as tolerated.   Let me know if you have a problem.

## 2021-06-30 NOTE — Progress Notes (Signed)
Foot fracture does not look significantly changed.

## 2021-07-02 ENCOUNTER — Ambulatory Visit: Payer: BC Managed Care – PPO | Admitting: Physical Therapy

## 2021-07-08 ENCOUNTER — Encounter: Payer: Self-pay | Admitting: Professional

## 2021-07-08 ENCOUNTER — Ambulatory Visit (INDEPENDENT_AMBULATORY_CARE_PROVIDER_SITE_OTHER): Payer: BC Managed Care – PPO | Admitting: Professional

## 2021-07-08 DIAGNOSIS — F4310 Post-traumatic stress disorder, unspecified: Secondary | ICD-10-CM

## 2021-07-08 DIAGNOSIS — F411 Generalized anxiety disorder: Secondary | ICD-10-CM

## 2021-07-08 NOTE — Progress Notes (Signed)
[place] Counselor/Therapist Progress Note  Patient ID: Megan May, MRN: [id number],    Date: 07/08/2021  Time Spent: 41 minutes 1001-1041 am   Treatment Type: Individual Therapy  Reported Symptoms: overwhelmed  Mental Status Exam: Appearance:  Neat     Behavior: Sharing  Motor: Normal  Speech/Language:  Clear and Coherent and Normal Rate  Affect: Full Range  Mood: normal  Thought process: goal directed  Thought content:   WNL  Sensory/Perceptual disturbances:   WNL  Orientation: oriented to person, place, time/date, and situation  Attention: Good  Concentration: Good  Memory: WNL  Fund of knowledge:  Good  Insight:   Good  Judgment:  Good  Impulse Control: Good   Risk Assessment: Danger to Self:  No Self-injurious Behavior: No Danger to Others: No Duty to Warn:no Physical Aggression / Violence:No  Access to Firearms a concern: No  Gang Involvement:No   Subjective: 1-Medical -pt got covid after her return from [place] -pt got covid again, two times in two months -she tested negative before she came home and tested positive upon arrival home -became very sick the second time with compromised breathing -59 O2 on walking in MD office, heart rate 56 and was not going up -she will be seeing cardiologist this week -she is waiting on results before being obsessed  2-sister Megan May relocation to [place] -helped her sister get the home completed in Nov 2nd -they were working on the house and her sister started having breathing issues -sister flying in to live in [place] on December 16th (Megan May)  -brother in [place] drinking reduced greatly from a case per day to 3-4 per day   -he will be moving with Megan May, sister's medical -she went to the hospital and spent five days in ICU -still struggling to recover 4-Holiday season -first Christmas with sister since junior high school -all their children will be in for Christmas   -in morning 9  people   -20-25 people for Christmas dinner -new year's eve at her mother in [place]   -she is excited as is her sister 5-Father's estate -got a cash offer on grandmother's home -father's house is on [employer]   -almost 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  Francie Massing, [facility]

## 2021-07-09 ENCOUNTER — Ambulatory Visit (INDEPENDENT_AMBULATORY_CARE_PROVIDER_SITE_OTHER): Payer: BC Managed Care – PPO | Admitting: Professional

## 2021-07-09 ENCOUNTER — Encounter: Payer: Self-pay | Admitting: Professional

## 2021-07-09 DIAGNOSIS — F411 Generalized anxiety disorder: Secondary | ICD-10-CM | POA: Diagnosis not present

## 2021-07-09 NOTE — Progress Notes (Addendum)
[place] Counselor/Therapist Progress Note  Patient ID: Megan May, MRN: [id number],    Date: 07/09/2021  Time Spent: 49 minutes 07-1248 pm  Treatment Type: Individual Therapy  Reported Symptoms: anxiety  Mental Status Exam: Appearance:  Neat     Behavior: Sharing and Care-Taking  Motor: Normal  Speech/Language:  Clear and Coherent and Normal Rate  Affect: Appropriate  Mood: sad  Thought process: goal directed  Thought content:   WNL  Sensory/Perceptual disturbances:   WNL  Orientation: oriented to person, place, time/date, and situation  Attention: Good  Concentration: Good  Memory: WNL  Fund of knowledge:  Good  Insight:   Good  Judgment:  Good  Impulse Control: Good   Risk Assessment: Danger to Self:  No Self-injurious Behavior: No Danger to Others: No Duty to Warn:no Physical Aggression / Violence:No  Access to Firearms a concern: No  Gang Involvement:No   Subjective: This session was held via video teletherapy due to the coronavirus risk at this time. The patient consented to video teletherapy and was located in her vehicle during this session. She is aware it is the responsibility of the patient to secure confidentiality on her end of the session. The provider was in a private home office for the duration of this session.   The patient arrived late for her webex session appearing pleasant and easily engaged.  Issues addressed: 1-really busy -has a lot going on right now -has meetings everyday after work, a lot of IEP's a lot of eligibilities -starting PT on 12th for broken foot -having laser surgery on Monday the 19th 2-daughter -getting in a lot of fun things with her daughter 3-anxiety and rumination -for special needs mom's when we go to sleep at night, that's when the worry starts -does not want to talk very much but she keeps seeing the age her daughter will be when she dies   -pt admits to being somewhat  -biggest fear  is that her daughter will be homeless and on the street because she has no sibling -her daughter is so well liked that she thinks someone would step up   -would like to speak with her nephews when older to see if they would care for her   -another option to be with family friends -she worries that everything will fall apart when her daughter is on her own -she is not capable of taking a shower by herself, she would be very fearful, if she fell she could not get up -she has had surface conversations with parents of her daughter's friends -fear of the unknown 4-social -pt is the person who gets her daughter's friends "the kids" and takes them on activities -planning for next year for daughter since she will be graduating from the [employer] program she currently attends -plan will be for her to work some, to have her do a lot of volunteering, and will have some girls hired for her to support when she is home -she belongs to a support group but everyone has the same issues  Interventions: Cognitive Behavioral Therapy, Solution-Oriented/Positive Psychology, and Psycho-education/Bibliotherapy  Diagnosis:Generalized anxiety disorder  Plan:  Meet again on Wed., 07-22-2021 @ 3 pm.  Francie Massing, [facility]

## 2021-07-10 ENCOUNTER — Other Ambulatory Visit: Payer: Self-pay

## 2021-07-10 ENCOUNTER — Ambulatory Visit (HOSPITAL_BASED_OUTPATIENT_CLINIC_OR_DEPARTMENT_OTHER): Payer: BC Managed Care – PPO | Admitting: Cardiovascular Disease

## 2021-07-10 VITALS — BP 144/78 | HR 59 | Ht 67.0 in | Wt 192.1 lb

## 2021-07-10 DIAGNOSIS — I10 Essential (primary) hypertension: Secondary | ICD-10-CM | POA: Diagnosis not present

## 2021-07-10 MED ORDER — LISINOPRIL 20 MG PO TABS: 20.0000 mg | ORAL_TABLET | Freq: Every day | ORAL | 3 refills | Status: DC

## 2021-07-10 NOTE — Assessment & Plan Note (Addendum)
Blood pressure was above goal today.  In general it has been well-controlled.  She is going to work on losing weight and start back tracking her BP at home.  Continue lisinopril for now.

## 2021-07-10 NOTE — Progress Notes (Signed)
Cardiology Office Note  Date:  07/11/2021   ID:  Megan May, DOB [birth year], MRN [id number]  PCP:  Mosie Lukes, MD  Cardiologist:   Skeet Latch, MD   No chief complaint on file.   History of Present Illness: Megan May is a 60 y.o. female with hypertension, eosinophilic esophagitis, depression and anxiety who presents for follow up.   She was first seen on 02/2016 for chest discomfort.  She was referred for ETT.  However when she got there her blood pressure was too high so it was cancelled.  She was started on lisinopril 20 mg daily.  She had dizziness but this seems to have improved.  She stopped taking HCTZ-triamterene but then noted that increased lower extremity edema.  She followed up with Tana Coast, PharmD, and was started on HCTZ.  She had and echo 03/09/16 that revealed LVEF 65-70% and was otherwise unremarkable.  She underwent ETT 05/2016 that was negative for ischemia.  She achieved 11.7 METs on a Bruce protocol.    At her last appointment, Megan May was struggling with fatigue from working 12-hour shifts. She reported occasional chest pain usually at rest. She had one near-syncopal episode after standing. At the time she was no taking lisinopril as instructed. Her chest pain was atypical and unchanged, so she did not have a repeat ischemic evaluation. She was seen by Kerin Ransom, PA-C in 2020 and reported spikes in blood pressure under stressful situations. She saw Bunnie Domino, NP in 2021 and was well. Today, she is feeling okay overall and her breathing has been fine. Since her last visit she has gained 10 lbs. 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 She believes the fracture is healing, but there was little change on repeat X-ray. At this time she has not been able to return to her baseline routines. Her blood pressure is monitored during PT, and has averaged 174-944  systolic. In her diet she uses pink Himalayan salt as a salt substitute. She denies any palpitations, chest pain, or shortness of breath. No lightheadedness, headaches, syncope, orthopnea, PND, lower extremity edema or exertional symptoms.   Past Medical History:  Diagnosis Date   Anxiety    Asthma    Depression    history of    Eosinophilic esophagitis    Fatigue 07/02/2017   Generalized anxiety disorder    Hyperglycemia 03/15/2016   Hyperlipidemia, mixed 06/30/2016   Hypersomnia, recurrent    Hypertension    Migraine    Migraines    Posttraumatic stress disorder     Past Surgical History:  Procedure Laterality Date   CESAREAN SECTION  2004   ESOPHAGOGASTRODUODENOSCOPY (EGD) WITH PROPOFOL N/A 01/12/2019   Procedure: ESOPHAGOGASTRODUODENOSCOPY (EGD) WITH PROPOFOL;  Surgeon: Carol Ada, MD;  Location: [facility];  Service: Endoscopy;  Laterality: N/A;   FRACTURE SURGERY  1973   R arm   LAPAROTOMY Bilateral 06/14/2014   Procedure: LAPAROTOMY with right  salpingo-oophorectomy and left salpingectomy;  Surgeon: Linda Hedges, DO;  Location: [facility];  Service: Gynecology;  Laterality: Bilateral;   TONSILLECTOMY AND ADENOIDECTOMY  1969     Current Outpatient Medications  Medication Sig Dispense Refill   omeprazole (PRILOSEC) 40 MG capsule Take 40 mg by mouth daily.     lisinopril (ZESTRIL) 20 MG tablet Take 1 tablet (20 mg total) by mouth daily. 90 tablet 3   No current facility-administered medications for this visit.  Allergies:   Amoxicillin-pot clavulanate, Levaquin [levofloxacin in d5w], Clindamycin, Clindamycin/lincomycin, Lactose intolerance (gi), Metoprolol, Penicillin g, Aspirin, Latex, and Penicillins    Social History:  The patient  reports that she has never smoked. She has never used smokeless tobacco. She reports that she does not drink alcohol and does not use drugs.   Family History:  The patient's family history includes CAD in her father; Diabetes in her  brother, father, and paternal grandmother; Heart disease in her father; Hypertension in her father; Other in her cousin; Stroke in her maternal grandfather and maternal grandmother.    ROS:   Please see the history of present illness. (+) Fracture of 5th metatarsal in left foot All other systems are reviewed and negative.    PHYSICAL EXAM: VS:  BP (!) 144/78 (BP Location: Left Arm, Patient Position: Sitting, Cuff Size: Large)   Pulse (!) 59   Ht 5\' 7"  (1.702 m)   Wt 192 lb 1.6 oz (87.1 kg)   LMP 05/10/2014 (Approximate)   BMI 30.09 kg/m  , BMI Body mass index is 30.09 kg/m. GENERAL:  Well appearing HEENT: Pupils equal round and reactive, fundi not visualized, oral mucosa unremarkable NECK:  No jugular venous distention, waveform within normal limits, carotid upstroke brisk and symmetric, no bruits, no thyromegaly LUNGS:  Clear to auscultation bilaterally HEART:  RRR.  PMI not displaced or sustained,S1 and S2 within normal limits, no S3, no S4, no clicks, no rubs, no murmurs ABD:  Flat, positive bowel sounds normal in frequency in pitch, no bruits, no rebound, no guarding, no midline pulsatile mass, no hepatomegaly, no splenomegaly EXT:  2 plus pulses throughout, no edema, no cyanosis no clubbing SKIN:  No rashes no nodules NEURO:  Cranial nerves II through XII grossly intact, motor grossly intact throughout PSYCH:  Cognitively intact, oriented to person place and time  EKG:   07/10/2021: Sinus bradycardia. Rate 59 bpm. 07/02/17: Sinus rhythm.  Rate 61 bpm.  Non-specific T wave abnormalities.   02/27/16: sinus bradycardia. Rate 59 bpm.  ETT 05/26/16: Blood pressure demonstrated a hypertensive response to exercise. There was no ST segment deviation noted during stress.   ETT with good exercise tolerance (10:00); no chest pain; hypertensive BP response; no diagnostic ST changes; negative adequate ETT; Duke treadmill score 10.  Echo 03/09/16: Study Conclusions - Left ventricle: The  cavity size was normal. Systolic function was   vigorous. The estimated ejection fraction was in the range of 65%   to 70%. Wall motion was normal; there were no regional wall   motion abnormalities. Left ventricular diastolic function   parameters were normal. - Aortic valve: Transvalvular velocity was within the normal range.   There was no stenosis. There was no regurgitation. - Mitral valve: Transvalvular velocity was within the normal range.   There was no evidence for stenosis. There was trivial   regurgitation. - Right ventricle: The cavity size was normal. Wall thickness was   normal. Systolic function was normal. - Atrial septum: No defect or patent foramen ovale was identified   by color flow Doppler. - Tricuspid valve: There was trivial regurgitation. - Pulmonary arteries: Systolic pressure was within the normal   range. PA peak pressure: 18 mm Hg (S).  Carotid u/s 05/02/13: IMPRESSION:   1.  Trace noncalcified atherosclerotic plaque in the left carotid bifurcation resulting in less than 50% diameter stenosis. 2.  Unremarkable right carotid artery. 3.  Bilateral vertebral arteries are patent with normal antegrade flow.   Recent Labs:  No results found for requested labs within last 8760 hours.    Lipid Panel    Component Value Date/Time   CHOL 199 06/18/2016 1624   TRIG 194.0 (H) 06/18/2016 1624   HDL 61.00 06/18/2016 1624   CHOLHDL 3 06/18/2016 1624   VLDL 38.8 06/18/2016 1624   LDLCALC 99 06/18/2016 1624      Wt Readings from Last 3 Encounters:  07/10/21 192 lb 1.6 oz (87.1 kg)  06/11/21 188 lb 6.4 oz (85.5 kg)  05/29/21 189 lb 3.2 oz (85.8 kg)      ASSESSMENT AND PLAN:  Essential hypertension, benign Blood pressure was above goal today.  In general it has been well-controlled.  She is going to work on losing weight and start back tracking her BP at home.  Continue lisinopril for now.    Current medicines are reviewed at length with the patient  today.  The patient does not have concerns regarding medicines.  The following changes have been made:  no change  Labs/ tests ordered today include:   Orders Placed This Encounter  Procedures   EKG 12-Lead    Disposition:   FU with Milo Schreier C. Oval Linsey, MD, [facility] in 3 months.   I,Mathew Stumpf,acting as a Education administrator for Skeet Latch, MD.,have documented all relevant documentation on the behalf of Skeet Latch, MD,as directed by  Skeet Latch, MD while in the presence of Skeet Latch, MD.  I, [place] Oval Linsey, MD have reviewed all documentation for this visit.  The documentation of the exam, diagnosis, procedures, and orders on 07/11/2021 are all accurate and complete.   Signed, Ata Pecha C. Oval Linsey, MD, [facility]  07/11/2021 12:29 PM    [place]

## 2021-07-10 NOTE — Patient Instructions (Signed)
Medication Instructions:  Continue current medications  *If you need a refill on your cardiac medications before your next appointment, please call your pharmacy*   Lab Work: None Ordered   Testing/Procedures: None Ordered   Follow-Up: At [facility], you and your health needs are our priority.  As part of our continuing mission to provide you with exceptional heart care, we have created designated Provider Care Teams.  These Care Teams include your primary Cardiologist (physician) and Advanced Practice Providers (APPs -  Physician Assistants and Nurse Practitioners) who all work together to provide you with the care you need, when you need it.  We recommend signing up for the patient portal called "MyChart".  Sign up information is provided on this After Visit Summary.  MyChart is used to connect with patients for Virtual Visits (Telemedicine).  Patients are able to view lab/test results, encounter notes, upcoming appointments, etc.  Non-urgent messages can be sent to your provider as well.   To learn more about what you can do with MyChart, go to [email or web address].    Your next appointment:   3 month(s)  The format for your next appointment:   In Person  Provider:   Skeet Latch, MD    Other Instructions Keep daily check of blood pressure

## 2021-07-11 ENCOUNTER — Encounter (HOSPITAL_BASED_OUTPATIENT_CLINIC_OR_DEPARTMENT_OTHER): Payer: Self-pay | Admitting: Cardiovascular Disease

## 2021-07-14 ENCOUNTER — Other Ambulatory Visit: Payer: Self-pay

## 2021-07-14 ENCOUNTER — Ambulatory Visit: Payer: BC Managed Care – PPO | Attending: Family Medicine | Admitting: Physical Therapy

## 2021-07-14 ENCOUNTER — Encounter: Payer: Self-pay | Admitting: Physical Therapy

## 2021-07-14 DIAGNOSIS — R262 Difficulty in walking, not elsewhere classified: Secondary | ICD-10-CM | POA: Diagnosis present

## 2021-07-14 DIAGNOSIS — S92355D Nondisplaced fracture of fifth metatarsal bone, left foot, subsequent encounter for fracture with routine healing: Secondary | ICD-10-CM | POA: Diagnosis not present

## 2021-07-14 DIAGNOSIS — M25572 Pain in left ankle and joints of left foot: Secondary | ICD-10-CM | POA: Diagnosis present

## 2021-07-14 DIAGNOSIS — M25672 Stiffness of left ankle, not elsewhere classified: Secondary | ICD-10-CM | POA: Insufficient documentation

## 2021-07-14 NOTE — Therapy (Signed)
[place]. [place], [place], [address] Phone: [phone]   Fax:  [phone]  Physical Therapy Evaluation  Patient Details  Name: Megan May MRN: [id number] Date of Birth: Mar 26, 1961 Referring Provider (PT): Megan May   Encounter Date: 07/14/2021   PT End of Session - 07/14/21 1753     Visit Number 1    Number of Visits 7    Date for PT Re-Evaluation 08/25/21    Authorization Type [employer]    Authorization Time Period 07/14/21 to 08/25/21    PT Start Time 1716    PT Stop Time 6433    PT Time Calculation (min) 27 min    Activity Tolerance Patient tolerated treatment well    Behavior During Therapy Megan May for tasks assessed/performed             Past Medical History:  Diagnosis Date   Anxiety    Asthma    Depression    history of    Eosinophilic esophagitis    Fatigue 07/02/2017   Generalized anxiety disorder    Hyperglycemia 03/15/2016   Hyperlipidemia, mixed 06/30/2016   Hypersomnia, recurrent    Hypertension    Migraine    Migraines    Posttraumatic stress disorder     Past Surgical History:  Procedure Laterality Date   CESAREAN SECTION  2004   ESOPHAGOGASTRODUODENOSCOPY (EGD) WITH PROPOFOL N/A 01/12/2019   Procedure: ESOPHAGOGASTRODUODENOSCOPY (EGD) WITH PROPOFOL;  Surgeon: Megan Ada, MD;  Location: [facility];  Service: Endoscopy;  Laterality: N/A;   FRACTURE SURGERY  1973   R arm   LAPAROTOMY Bilateral 06/14/2014   Procedure: LAPAROTOMY with right  salpingo-oophorectomy and left salpingectomy;  Surgeon: Megan Hedges, DO;  Location: [facility];  Service: Gynecology;  Laterality: Bilateral;   TONSILLECTOMY AND ADENOIDECTOMY  1969    There were no vitals filed for this visit.    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 I think the ankle rolled and I ended up with these  fractures. I've been WBAT first in a boot but they progressed me to an ankle brace now. I can walk ok, I've not had to do steps. Pain is not bad it keeps getting progressively better. It just feels weak to me.    How long can you stand comfortably? no issues    How long can you walk comfortably? grocery store distances    Patient Stated Goals get rid of pain, be able to walk in something other than flat shoe    Currently in Pain? No/denies   4/10 at worst with brace; if I don't have brace on it gets much higher               [facility] PT Assessment - 07/14/21 0001       Assessment   Referring Provider (PT) Megan May    Onset Date/Surgical Date --   September 2022   Next MD Visit Megan May on the 29th    Prior Therapy yes      Precautions   Precautions Other (comment)    Precaution Comments WBAT in ASO      Restrictions   Weight Bearing Restrictions Yes    Other Position/Activity Restrictions WBAT      Balance Screen   Has the patient fallen in the past 6 months Yes    How many times?  fall that caused fx, 3 falls after that- foot got caught in drawer, in drawstring bag hanging in closet, in classroom    Has the patient had a decrease in activity level because of a fear of falling?  No    Is the patient reluctant to leave their home because of a fear of falling?  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    Leisure going to shows and concerts      Observation/Other Assessments   Observations BP 141/91   pulse 66     ROM / Strength   AROM / PROM / Strength AROM;Strength      AROM   AROM Assessment Site Ankle;Knee    Right/Left Knee Left;Right    Right/Left Ankle Right;Left    Right Ankle Dorsiflexion --    Right Ankle Plantar Flexion --    Right Ankle Inversion --    Right  Ankle Eversion --    Left Ankle Dorsiflexion 5    Left Ankle Plantar Flexion 63    Left Ankle Inversion 23    Left Ankle Eversion 32      Strength   Strength Assessment Site Ankle;Knee    Right/Left Knee Left;Right    Right Knee Flexion 4/5    Right Knee Extension 5/5    Right/Left Ankle Left;Right    Right Ankle Dorsiflexion 5/5    Right Ankle Inversion 5/5    Right Ankle Eversion 5/5    Left Ankle Dorsiflexion 5/5    Left Ankle Inversion 5/5    Left Ankle Eversion 5/5      Palpation   Palpation comment TTP R foot plantar fascia                        Objective measurements completed on examination: See above findings.                PT Education - 07/14/21 1753     Education Details POC, prognosis, HEP    Person(s) Educated Patient    Methods Explanation;Handout    Comprehension Verbalized understanding;Returned demonstration              PT Short Term Goals - 07/14/21 1756       PT SHORT TERM GOAL #1   Title Will be independent with appropriate HEP to be updated PRN    Time 1    Period Weeks    Status New    Target Date 07/21/21               PT Long Term Goals - 07/14/21 1758       PT LONG TERM GOAL #1   Title Pain in weight bearing will be no more than 2/10 at worst in order to improve activity tolerance and community access    Time 6    Period Weeks    Status New    Target Date 08/25/21      PT LONG TERM GOAL #2   Title R ankle DF to be at least 15 degrees in order to improve gait mechanics and reduce pain    Time 6    Period Weeks    Status New      PT LONG TERM GOAL #3  Title Will be able to maintain SLS for at least 15 seconds and tandem stance for at least 30 seconds in order to show improved functional balance    Time 6    Period Weeks    Status New      PT LONG TERM GOAL #4   Title Will be able to return to gym based routine with pain no more than 2/10 in order to improve overall fitness and  cardiovascular health, QOL    Time 6    Period Weeks    Status New                    Plan - 07/14/21 1754     Clinical Impression Statement Megan May arrives today doing OK, it's been about 2.5 months since she had her fall and fracture; she reports that things are going well overall, being in the ASO instead of the boot is more challenging and pain can be up to 4/10 in the brace. Has no issues with static standing, can walk about grocery store distances before pain starts in ASO. Exam reveals mild R ankle dorsiflexion stiffness as well as eversion hypermobility; strength is OK but ankle mm endurance is likely impaired from being in a boot for so long. She tells me that it has been healing well so far- would like to have a 1x/week therapy POC for now, we can always increase frequency as needed if progress is not as expected. Will benefit from skilled PT services to address pain and functional impairments moving forward.    Personal Factors and Comorbidities Fitness;Comorbidity 2;Sex;Time since onset of injury/illness/exacerbation    Examination-Activity Limitations Locomotion Level;Carry;Stairs    Examination-Participation Restrictions Cleaning;Occupation;Community Activity;Driving;Laundry;Yard Work    Stability/Clinical Decision Making Stable/Uncomplicated    Clinical Decision Making Low    Rehab Potential Good    PT Frequency 1x / week    PT Duration 6 weeks    PT Treatment/Interventions ADLs/Self Care Home Management;Cryotherapy;Electrical Stimulation;Iontophoresis 4mg /ml Dexamethasone;Moist Heat;Ultrasound;Gait training;Stair training;Functional mobility training;Therapeutic activities;Therapeutic exercise;Balance training;Neuromuscular re-education;Patient/family education;Manual techniques;Passive range of motion;Dry needling;Taping    PT Next Visit Plan progress HEP every session; WBAT in ASO. Open chain ROM and strength out of ASO, standing balance and strength in ASO    PT Home  Exercise Plan [id number]    Consulted and Agree with Plan of Care Patient             Patient will benefit from skilled therapeutic intervention in order to improve the following deficits and impairments:  Decreased range of motion, Difficulty walking, Pain, Hypomobility, Decreased mobility, Decreased strength  Visit Diagnosis: Pain in left ankle and joints of left foot  Stiffness of left ankle, not elsewhere classified  Difficulty in walking, not elsewhere classified     Problem List Patient Active Problem List   Diagnosis Date Noted   Closed nondisplaced fracture of fifth left metatarsal bone 05/01/2021   Blepharitis 03/26/2021   Patellofemoral arthritis of right knee 03/25/2018   Chronic pain of right knee 03/16/2018   Injury of cervical spine ([place]) 03/16/2018   Fatigue 07/02/2017   Hyperlipidemia, mixed 06/30/2016   Hyperglycemia 03/15/2016   Atypical chest pain 01/17/2016   RUQ pain 01/17/2016   Right hip pain 03/24/2015   Leukocytopenia 03/24/2015   Bursitis of left shoulder 03/22/2015   Greater trochanteric bursitis of right hip 03/22/2015   S/P laparotomy 06/14/2014   Diverticulosis of colon without hemorrhage  colonoscopy 2015 05/27/2014   Colon polyp 05/02/2014  Microscopic hematuria work up urology  see 01/2014 note 04/22/2014   Hypersomnia, recurrent    Dysarthria 05/01/2013   Mastodynia 10/13/2012   Mass of right breast 10/13/2012   Essential hypertension, benign 06/29/2011   Migraines 06/29/2011   Anxiety 57/32/2025   Eosinophilic esophagitis 42/70/6237   Lupus anticoagulant positive 06/29/2011   Ann Lions PT, DPT, PN2   Supplemental Physical Therapist [facility]    Pager [phone] Acute Rehab Office [place]. [place], [place], [address] Phone: [phone]   Fax:  [phone]  Name: Megan May MRN: [id number] Date of Birth: 11-19-1960

## 2021-07-16 ENCOUNTER — Encounter (HOSPITAL_BASED_OUTPATIENT_CLINIC_OR_DEPARTMENT_OTHER): Payer: Self-pay | Admitting: Cardiovascular Disease

## 2021-07-17 NOTE — Telephone Encounter (Signed)
Please advise if medication changes are needed.   These are most recent BP readings. Pt does endorse recent stressors at work and an increase in anxiety. Minor chest pain that has since resolved.   Last BP readings  134/79 144/100 151/98 161/94 156/88 140/83

## 2021-07-22 ENCOUNTER — Encounter: Payer: Self-pay | Admitting: Professional

## 2021-07-22 ENCOUNTER — Encounter: Payer: Self-pay | Admitting: Family Medicine

## 2021-07-22 ENCOUNTER — Ambulatory Visit (INDEPENDENT_AMBULATORY_CARE_PROVIDER_SITE_OTHER): Payer: BC Managed Care – PPO | Admitting: Professional

## 2021-07-22 ENCOUNTER — Other Ambulatory Visit: Payer: Self-pay

## 2021-07-22 ENCOUNTER — Telehealth: Payer: Self-pay | Admitting: Family Medicine

## 2021-07-22 ENCOUNTER — Ambulatory Visit: Payer: BC Managed Care – PPO | Admitting: Physical Therapy

## 2021-07-22 ENCOUNTER — Encounter: Payer: Self-pay | Admitting: Physical Therapy

## 2021-07-22 DIAGNOSIS — F4310 Post-traumatic stress disorder, unspecified: Secondary | ICD-10-CM | POA: Diagnosis not present

## 2021-07-22 DIAGNOSIS — M25672 Stiffness of left ankle, not elsewhere classified: Secondary | ICD-10-CM

## 2021-07-22 DIAGNOSIS — R262 Difficulty in walking, not elsewhere classified: Secondary | ICD-10-CM

## 2021-07-22 DIAGNOSIS — F411 Generalized anxiety disorder: Secondary | ICD-10-CM | POA: Insufficient documentation

## 2021-07-22 DIAGNOSIS — M25572 Pain in left ankle and joints of left foot: Secondary | ICD-10-CM

## 2021-07-22 NOTE — Therapy (Signed)
[place]. [place], [place], [address] Phone: [phone]   Fax:  [phone]  Physical Therapy Treatment  Patient Details  Name: Megan May MRN: [id number] Date of Birth: 03-08-1961 Referring Provider (PT): Martha Clan   Encounter Date: 07/22/2021   PT End of Session - 07/22/21 1337     Visit Number 2    Date for PT Re-Evaluation 08/25/21    Authorization Time Period 07/14/21 to 08/25/21    PT Start Time 1300    PT Stop Time 1342    PT Time Calculation (min) 42 min    Activity Tolerance Patient tolerated treatment well    Behavior During Therapy [facility] for tasks assessed/performed             Past Medical History:  Diagnosis Date   Anxiety    Asthma    Depression    history of    Eosinophilic esophagitis    Fatigue 07/02/2017   Generalized anxiety disorder    Hyperglycemia 03/15/2016   Hyperlipidemia, mixed 06/30/2016   Hypersomnia, recurrent    Hypertension    Migraine    Migraines    Posttraumatic stress disorder     Past Surgical History:  Procedure Laterality Date   CESAREAN SECTION  2004   ESOPHAGOGASTRODUODENOSCOPY (EGD) WITH PROPOFOL N/A 01/12/2019   Procedure: ESOPHAGOGASTRODUODENOSCOPY (EGD) WITH PROPOFOL;  Surgeon: Carol Ada, MD;  Location: [facility];  Service: Endoscopy;  Laterality: N/A;   FRACTURE SURGERY  1973   R arm   LAPAROTOMY Bilateral 06/14/2014   Procedure: LAPAROTOMY with right  salpingo-oophorectomy and left salpingectomy;  Surgeon: Linda Hedges, DO;  Location: [facility];  Service: Gynecology;  Laterality: Bilateral;   TONSILLECTOMY AND ADENOIDECTOMY  1969    There were no vitals filed for this visit.   Subjective Assessment - 07/22/21 1259     Subjective "Not bad" Some days she feels like it is fine, very slight pain    Currently in Pain? No/denies                               [facility] Adult PT Treatment/Exercise - 07/22/21  0001       Exercises   Exercises Ankle      Manual Therapy   Manual Therapy Passive ROM    Passive ROM L ankle all drections      Ankle Exercises: Aerobic   Recumbent Bike L2.5 x 6 min    Other Aerobic Resisted gait 30lb 4 way x 3 each      Ankle Exercises: Machines for Strengthening   Cybex Leg Press Leg curls 25lb 2x10, Leg Ext 10lb 2x10      Ankle Exercises: Standing   Other Standing Ankle Exercises 6'' step ups x10 each    Other Standing Ankle Exercises 4''  step downs LLE 2x10      Ankle Exercises: Seated   ABC's 1 rep    Ankle Circles/Pumps AROM;Strengthening;Left;20 reps    Other Seated Ankle Exercises Ankle 4 way green x 15    Other Seated Ankle Exercises sit to stands on airex 3x 5                       PT Short Term Goals - 07/22/21 1339       PT SHORT TERM GOAL #1   Title Will be independent with appropriate HEP to be updated PRN  Status Achieved               PT Long Term Goals - 07/22/21 1339       PT LONG TERM GOAL #1   Title Pain in weight bearing will be no more than 2/10 at worst in order to improve activity tolerance and community access    Status Partially Met                   Plan - 07/22/21 1340     Clinical Impression Statement Pt tolerated an initial progression to TE well evident by no subjective reports of increase pain. She did well completing all interventions. Some LE weakness present today with step ups. Cues needed to maintain equal step length with resisted gait. Some bilateral ankle pronation noted with sit to stands on airex.    Personal Factors and Comorbidities Fitness;Comorbidity 2;Sex;Time since onset of injury/illness/exacerbation    Examination-Activity Limitations Locomotion Level;Carry;Stairs    Examination-Participation Restrictions Cleaning;Occupation;Community Activity;Driving;Laundry;Yard Work    Stability/Clinical Decision Making Stable/Uncomplicated    Rehab Potential Good    PT  Frequency 1x / week    PT Treatment/Interventions ADLs/Self Care Home Management;Cryotherapy;Electrical Stimulation;Iontophoresis 78m/ml Dexamethasone;Moist Heat;Ultrasound;Gait training;Stair training;Functional mobility training;Therapeutic activities;Therapeutic exercise;Balance training;Neuromuscular re-education;Patient/family education;Manual techniques;Passive range of motion;Dry needling;Taping    PT Next Visit Plan progress HEP every session; WBAT in ASO. Open chain ROM and strength out of ASO, standing balance and strength in ASO             Patient will benefit from skilled therapeutic intervention in order to improve the following deficits and impairments:  Decreased range of motion, Difficulty walking, Pain, Hypomobility, Decreased mobility, Decreased strength  Visit Diagnosis: Pain in left ankle and joints of left foot  Stiffness of left ankle, not elsewhere classified  Difficulty in walking, not elsewhere classified     Problem List Patient Active Problem List   Diagnosis Date Noted   Closed nondisplaced fracture of fifth left metatarsal bone 05/01/2021   Blepharitis 03/26/2021   Patellofemoral arthritis of right knee 03/25/2018   Chronic pain of right knee 03/16/2018   Injury of cervical spine ([place] 03/16/2018   Fatigue 07/02/2017   Hyperlipidemia, mixed 06/30/2016   Hyperglycemia 03/15/2016   Atypical chest pain 01/17/2016   RUQ pain 01/17/2016   Right hip pain 03/24/2015   Leukocytopenia 03/24/2015   Bursitis of left shoulder 03/22/2015   Greater trochanteric bursitis of right hip 03/22/2015   S/P laparotomy 06/14/2014   Diverticulosis of colon without hemorrhage  colonoscopy 2015 05/27/2014   Colon polyp 05/02/2014   Microscopic hematuria work up urology  see 01/2014 note 04/22/2014   Hypersomnia, recurrent    Dysarthria 05/01/2013   Mastodynia 10/13/2012   Mass of right breast 10/13/2012   Essential hypertension, benign 06/29/2011   Migraines  06/29/2011   Anxiety 156/38/9373  Eosinophilic esophagitis 142/87/6811  Lupus anticoagulant positive 06/29/2011    RScot Jun PTA 07/22/2021, 1:42 PM  [place] [address]Phone: [phone]  Fax:  [phone] Name: Megan BIERMRN: [id number] of Birth: [birth year]

## 2021-07-22 NOTE — Telephone Encounter (Signed)
Pt's mailbox was full

## 2021-07-22 NOTE — Telephone Encounter (Signed)
Pt stated she went to urgent care and they gave her an antibiotic and she feels like she may be having a side effect and wanted to speak with a nurse. She stated she is allergic to a lot of antibiotics and wanted to be put on something else. Her symptoms included a dull pain on top of her head, no other symptoms. She stated on the bottle it is one the side effects, she did not have an other symptoms like itchiness. Scheduled her with Melissa for tomorrow and transferred her to triage because she wanted to go over her symptoms with a nurse and see if she needed to go back to urgent care. Please advise.

## 2021-07-22 NOTE — Progress Notes (Signed)
[place] Counselor/Therapist Progress Note  Patient ID: Megan May, MRN: [id number],    Date: 07/22/2021  Time Spent: 59 minutes 3-359 pm   Treatment Type: Individual Therapy  Reported Symptoms: frustrated and hurt  Mental Status Exam: Appearance:  Well Groomed     Behavior: Appropriate and Sharing  Motor: Normal  Speech/Language:  Clear and Coherent and Normal Rate  Affect: Full Range  Mood: irritable  Thought process: goal directed  Thought content:   WNL  Sensory/Perceptual disturbances:   WNL  Orientation: oriented to person, place, time/date, and situation  Attention: Good  Concentration: Good  Memory: WNL  Fund of knowledge:  Good  Insight:   Good  Judgment:  Good  Impulse Control: Good   Risk Assessment: Danger to Self:  No Self-injurious Behavior: No Danger to Others: No Duty to Warn:no Physical Aggression / Violence:No  Access to Firearms a concern: No  Gang Involvement:No   Subjective: This session was held via video teletherapy due to the coronavirus risk at this time. The patient consented to video teletherapy and was located at her home during this session. She is aware it is the responsibility of the patient to secure confidentiality on her end of the session. The provider was in a private home office for the duration of this session.   The patient arrived on time for her webex appointment appearing nicely groomed and easily engaged  Issues addressed: 1- personal -has been sick the past two days -went to cardiologist   -cardiologist identified that she was running high blood pressures   -cardiologist also commented on her cholesterol   -told her she needed to lose weight and cut the carbs     -pt was very upset by that     -first time an MD has ever said something to her     -pt heard the cardiologist as "she are fat and ugly" -pt called to tell spouse and daughter that she was not in a good mood and need some space    -they were hounding her for information   -she then spoke up and told him she was being unfair to them     -pt said he was being unfair to her       -he agreed and apologized -she backed out of alzheimer's study -as a child she was the tall, pretty, intelligent one   -her sister is like a walking Medical sales representative   -her brothers are both very smart   -her two sisters-in-law one is model beautiful and the other is gracing the cover of magazines 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 -her department is unable to complete the diagnostics for herself and this creates issues for her treatment because it has been given to [employer]   -feels frustrated   -she is missing a vital piece and if is being evaluated from a difference lens   -pt admits she has felt consumed for the past two weeks while in the office     -she is having issues focusing due to having to support other SLPs and Gen Ed despite her not having a current case     -the IPS coordinators are confused and they are relying on the SLPs do not know what to do either       -she is then trying to help the SLPs and the Gen Ed folks and her coworkers       -the [facility] Director's attitude has been somewhat flippant     -  pt is being requested to write letters for her since they are more professionally led     -she is constantly being disrupted by coworkers who want her to read memos that cause the patient to feel fueled -there are people who are being hateful   -receiving "ugly emails" -how to manage   -do you have any control     -the only thing I can do is tell a person that I can not help them     -pt admits that she is taking it home even though it helps her recall the process Memory issues have begin   -pushing the wrong buttons in the car     -two cars one with buttons and one without   -she did not remember that she set up a teams meeting and she had no recall     -she was rushing to send out invitations and she had forgotten that she had  already done so       -she does admits that she is being stretched and that people are asking for something     Interventions: Cognitive Behavioral Therapy, Assertiveness/Communication, Solution-Oriented/Positive Psychology, and Family Systems  Diagnosis:Generalized anxiety disorder  PTSD (post-traumatic stress disorder)  Plan:  -meet again on Tuesday, August 05, 2021 at [place], [facility]

## 2021-07-23 ENCOUNTER — Other Ambulatory Visit: Payer: Self-pay | Admitting: Family Medicine

## 2021-07-23 ENCOUNTER — Ambulatory Visit: Payer: BC Managed Care – PPO | Admitting: Family

## 2021-07-23 ENCOUNTER — Other Ambulatory Visit: Payer: Self-pay

## 2021-07-23 ENCOUNTER — Other Ambulatory Visit (HOSPITAL_BASED_OUTPATIENT_CLINIC_OR_DEPARTMENT_OTHER): Payer: Self-pay

## 2021-07-23 MED ORDER — AZITHROMYCIN 250 MG PO TABS: ORAL_TABLET | ORAL | 0 refills | Status: DC

## 2021-07-23 MED ORDER — AZITHROMYCIN 250 MG PO TABS
ORAL_TABLET | ORAL | 0 refills | Status: AC
  Filled 2021-07-23: qty 6, 5d supply, fill #0

## 2021-07-23 NOTE — Telephone Encounter (Signed)
Will be seeing Melissa today

## 2021-07-25 ENCOUNTER — Encounter: Payer: Self-pay | Admitting: Family Medicine

## 2021-07-27 ENCOUNTER — Encounter (HOSPITAL_BASED_OUTPATIENT_CLINIC_OR_DEPARTMENT_OTHER): Payer: Self-pay | Admitting: Cardiovascular Disease

## 2021-07-27 ENCOUNTER — Encounter: Payer: Self-pay | Admitting: Family Medicine

## 2021-07-29 NOTE — Telephone Encounter (Signed)
Pt sent a message 07/27/21 that she was doing better

## 2021-07-31 ENCOUNTER — Ambulatory Visit: Payer: BC Managed Care – PPO | Admitting: Family Medicine

## 2021-08-05 ENCOUNTER — Ambulatory Visit: Payer: BC Managed Care – PPO | Attending: Family Medicine | Admitting: Physical Therapy

## 2021-08-05 ENCOUNTER — Other Ambulatory Visit: Payer: Self-pay

## 2021-08-05 ENCOUNTER — Encounter: Payer: Self-pay | Admitting: Professional

## 2021-08-05 ENCOUNTER — Encounter: Payer: Self-pay | Admitting: Physical Therapy

## 2021-08-05 ENCOUNTER — Ambulatory Visit (INDEPENDENT_AMBULATORY_CARE_PROVIDER_SITE_OTHER): Payer: BC Managed Care – PPO | Admitting: Professional

## 2021-08-05 DIAGNOSIS — M25572 Pain in left ankle and joints of left foot: Secondary | ICD-10-CM | POA: Diagnosis not present

## 2021-08-05 DIAGNOSIS — F411 Generalized anxiety disorder: Secondary | ICD-10-CM | POA: Diagnosis not present

## 2021-08-05 DIAGNOSIS — R262 Difficulty in walking, not elsewhere classified: Secondary | ICD-10-CM | POA: Diagnosis present

## 2021-08-05 DIAGNOSIS — M25672 Stiffness of left ankle, not elsewhere classified: Secondary | ICD-10-CM | POA: Insufficient documentation

## 2021-08-05 DIAGNOSIS — F4310 Post-traumatic stress disorder, unspecified: Secondary | ICD-10-CM

## 2021-08-05 NOTE — Progress Notes (Addendum)
[facility] Counselor/Therapist Progress Note  Patient ID: Megan May, MRN: [id number],    Date: 08/05/2021  Time Spent: 50 minutes 8-850 am  Treatment Type: Individual Therapy  Reported Symptoms: physically sick but not covid  Mental Status Exam: Appearance:  Well Groomed     Behavior: Appropriate and Sharing  Motor: Normal  Speech/Language:  Clear and Coherent and Normal Rate  Affect: Full Range  Mood: normal  Thought process: normal  Thought content:   WNL  Sensory/Perceptual disturbances:   WNL  Orientation: oriented to person, place, time/date, and situation  Attention: Negative  Concentration: Good  Memory: WNL  Fund of knowledge:  Good  Insight:   Good  Judgment:  Good  Impulse Control: Good   Risk Assessment: Danger to Self:  No Self-injurious Behavior: No Danger to Others: No Duty to Warn:no Physical Aggression / Violence:No  Access to Firearms a concern: No  Gang Involvement:No   Subjective:  This session was held via video teletherapy due to the coronavirus risk at this time. The patient consented to video teletherapy and was located at her home during this session. She is aware it is the responsibility of the patient to secure confidentiality on her end of the session. The provider was in a private home office for the duration of this session.   The patient arrived on time for her webex appointment appearing nicely groomed and easily engaged  Issues addressed: 1- personal -pt sick entire holiday -went home to visit mother   -siblings were not there due to weather -daughter Dominica  -started group text and carefully included her husband   -husband thought he was being funny but was offensive   -patient embarrassed by husband's remarks   -Sydney's friend at school has disappeared   -patient fears for her daughter's safety even at home 2-mood -depressed   -did not want to leave her home   -she got back to [place] and just wanted  to be back with her mom   -she has been missing her father     -her father did not get angry or rebellious related to his Alzheimer's     -pt moved on very quickly and reports she thinks she grieved before he died -fearful of leaving her daughter and believes that she is going to die soon   -stroke runs in family   -her HTN     -blood pressure spiked due to spouse's inappropriate in group text   -pressure on head from the medication she took recently   -constant worry about her daughter's future  Problems Addressed  Anxiety, Balancing Work and Family/Multiple Roles, Low Self-Esteem/Lack of Assertiveness, Partner Relational Problems  Goals 1. Address and eliminate maladaptive thought processes that lead to anxious responses. 2. Develop a realistic perspective regarding demands and obligations of multiple roles and their completion. 3. Develop the necessary skills for effective, open communication and mutually satisfying intimacy. 4. increased awareness of own role in relationship conflicts. Objective Increase the frequency and quality of communication with the partner. Target Date: 2022-06-24 Frequency: Biweekly  Progress: 0 Modality: individual  Related Interventions Help the client to explore and clarify her own values and priorities; encourage her to reduce the psychological importance placed on one or more roles to reduce the level of conflict. Help the client to develop a realistic schedule that outlines the responsibilities of her partner and family members; help the client enlist the commitment of all individuals to the schedule. Explore, along with the client,  strategies for making her life more manageable and less stressful (e.g., waking up before the children to exercise or have a cup of tea, pack the children's lunches and backpacks the night before). Obtain feedback on any difficulties the client experiences in application and provide tips for integrating methods into his/her  everyday routine. Objective Each partner identifies her/his own role in the conflicts. Target Date: 2022-06-24 Frequency: Biweekly  Progress: 0 Modality: individual  Objective Identify the positive aspects of the relationship. Target Date: 2022-06-24 Frequency: Biweekly  Progress: 0 Modality: individual  Objective Utilize at least two new conflict-resolution techniques to resolve issues reasonably. Target Date: 2022-06-24 Frequency: Biweekly  Progress: 0 Modality: individual  5. Increase overall sense of well-being via reduction/elimination of anxiety. 6. Maintain a balance between the multiple demands of motherhood, work, and other life roles and responsibilities. Objective Generate a list of self-care activities and make a commitment to regularly participate in such activities. Target Date: 2022-06-24 Frequency: Biweekly  Progress: 0 Modality: individual  Objective Implement a schedule and division of home responsibilities with partner and family members. Target Date: 2022-06-24 Frequency: Biweekly  Progress: 0 Modality: individual  Objective Protect each role from interfering with the other. Target Date: 2022-06-24 Frequency: Biweekly  Progress: 0 Modality: individual  Objective Clarify values and priorities. Target Date: 2022-06-24 Frequency: Biweekly  Progress: 0 Modality: individual  7. Reduce self-disparaging remarks and negative self-talk. Objective Identify three anxiety-related fears associated with being assertive. Target Date: 2022-06-24 Frequency: Biweekly  Progress: 0 Modality: individual  Objective Recall situations in which beliefs and feelings were asserted. Target Date: 2022-06-24 Frequency: Biweekly  Progress: 0 Modality: individual  Objective Practice saying "no" to at least one person, declining to comply with a request/favor or identify and assert rights, needs, and wants. Target Date: 2022-06-24 Frequency: Biweekly  Progress: 0 Modality: individual   Objective Verbalize an understanding of the differences between passive, assertive, and aggressive behavior. Target Date: 2022-06-24 Frequency: Biweekly  Progress: 0 Modality: individual  8. Resolve issues involving low self-esteem or low self-efficacy that contribute to anxiety. Objective Self-correct maladaptive thought patterns preemptively in order to lessen or eliminate anxiety at least 90% of the time; increase positive self-talk. Target Date: 2022-06-24 Frequency: Biweekly  Progress: 0 Modality: individual  Related Interventions Collaborate with the client to determine what kind of changes need to be made, internally and externally, for calmness and satisfaction to exist within the arenas of work, home, and school. Objective Acknowledge underlying irrational or illogical thought patterns that contribute to anxiety. Target Date: 2022-06-24 Frequency: Biweekly  Progress: 0 Modality: individual  Objective Learn and practice methods of reducing anxiety in a variety of situations. Target Date: 2022-06-24 Frequency: Biweekly  Progress: 0 Modality: individual  Objective Identify precipitating events, thoughts, feelings, and reactions that are believed to contribute to anxiety. Target Date: 2022-06-24 Frequency: Biweekly  Progress: 0 Modality: individual  Objective Verbalize anxiety symptoms as well as when, where, and how frequently they occur; describe attempts to resolve anxiety. Target Date: 2022-06-24 Frequency: Biweekly  Progress: 0 Modality: individual    Interventions: Assertiveness/Communication, Solution-Oriented/Positive Psychology, and Psycho-education/Bibliotherapy  Diagnosis:Generalized anxiety disorder  PTSD (post-traumatic stress disorder)  Plan:  -meet again on Tuesday, August 19, 2021 at [facility], [facility]

## 2021-08-05 NOTE — Therapy (Signed)
[place]. [place], [place], [address] Phone: [phone]   Fax:  [phone]  Physical Therapy Treatment  Patient Details  Name: Megan May MRN: [id number] Date of Birth: January 28, 1961 Referring Provider (PT): Martha Clan   Encounter Date: 08/05/2021   PT End of Session - 08/05/21 1221     Visit Number 3    Date for PT Re-Evaluation 08/25/21    Authorization Type BCBS    PT Start Time 3606    PT Stop Time 1228    PT Time Calculation (min) 43 min    Activity Tolerance Patient tolerated treatment well    Behavior During Therapy [facility] for tasks assessed/performed             Past Medical History:  Diagnosis Date   Anxiety    Asthma    Depression    history of    Eosinophilic esophagitis    Fatigue 07/02/2017   Generalized anxiety disorder    Hyperglycemia 03/15/2016   Hyperlipidemia, mixed 06/30/2016   Hypersomnia, recurrent    Hypertension    Migraine    Migraines    Posttraumatic stress disorder     Past Surgical History:  Procedure Laterality Date   CESAREAN SECTION  2004   ESOPHAGOGASTRODUODENOSCOPY (EGD) WITH PROPOFOL N/A 01/12/2019   Procedure: ESOPHAGOGASTRODUODENOSCOPY (EGD) WITH PROPOFOL;  Surgeon: Carol Ada, MD;  Location: [facility];  Service: Endoscopy;  Laterality: N/A;   FRACTURE SURGERY  1973   R arm   LAPAROTOMY Bilateral 06/14/2014   Procedure: LAPAROTOMY with right  salpingo-oophorectomy and left salpingectomy;  Surgeon: Linda Hedges, DO;  Location: [facility];  Service: Gynecology;  Laterality: Bilateral;   TONSILLECTOMY AND ADENOIDECTOMY  1969    There were no vitals filed for this visit.   Subjective Assessment - 08/05/21 1147     Subjective "Pretty good" no limitations             No pain                  [facility] Adult PT Treatment/Exercise - 08/05/21 0001       High Level Balance   High Level Balance Comments SLS cone taps 2x5       Ankle Exercises: Aerobic   Recumbent Bike L2.5 x 6 min    Other Aerobic Resisted gait 30lb 4 way x 3 each      Ankle Exercises: Standing   Other Standing Ankle Exercises 6'' step ups x10 each    Other Standing Ankle Exercises 4''  step downs LLE 2x10      Ankle Exercises: Stretches   Gastroc Stretch 3 reps;10 seconds      Ankle Exercises: Seated   Other Seated Ankle Exercises Ankle 4 way green x 15    Other Seated Ankle Exercises sit to stands on airex 2x10      Ankle Exercises: Machines for Strengthening   Cybex Leg Press Leg curls 25lb 2x10, Leg Ext 10lb 2x10                       PT Short Term Goals - 07/22/21 1339       PT SHORT TERM GOAL #1   Title Will be independent with appropriate HEP to be updated PRN    Status Achieved               PT Long Term Goals - 08/05/21 1222  PT LONG TERM GOAL #1  ° Title Pain in weight bearing will be no more than 2/10 at worst in order to improve activity tolerance and community access   ° Status Partially Met   °  ° PT LONG TERM GOAL #2  ° Title R ankle DF to be at least 15 degrees in order to improve gait mechanics and reduce pain   ° Status On-going   °  ° PT LONG TERM GOAL #3  ° Title Will be able to maintain SLS for at least 15 seconds and tandem stance for at least 30 seconds in order to show improved functional balance   ° Status On-going   °  ° PT LONG TERM GOAL #4  ° Title Will be able to return to gym based routine with pain no more than 2/10 in order to improve overall fitness and cardiovascular health, QOL   ° Status --   Pt does not plan on joining a gym  ° °  °  ° °  ° ° ° ° ° ° ° ° Plan - 08/05/21 1224   ° ° Clinical Impression Statement Pt return to therapy after two weeks. She continues to wear brace on L ankle but doe not know when she can remove it. Bilateral LE fatigue present with sit to stands. Some instability with SLS cone taps. Pt did report some R ankle pain with planter flexion with Tband. No  functional limitations reported.   ° Personal Factors and Comorbidities Fitness;Comorbidity 2;Sex;Time since onset of injury/illness/exacerbation   ° Examination-Activity Limitations Locomotion Level;Carry;Stairs   ° Examination-Participation Restrictions Cleaning;Occupation;Community Activity;Driving;Laundry;Yard Work   ° Stability/Clinical Decision Making Stable/Uncomplicated   ° Rehab Potential Good   ° PT Frequency 1x / week   ° PT Duration 6 weeks   ° PT Treatment/Interventions ADLs/Self Care Home Management;Cryotherapy;Electrical Stimulation;Iontophoresis 4mg/ml Dexamethasone;Moist Heat;Ultrasound;Gait training;Stair training;Functional mobility training;Therapeutic activities;Therapeutic exercise;Balance training;Neuromuscular re-education;Patient/family education;Manual techniques;Passive range of motion;Dry needling;Taping   ° PT Next Visit Plan WBAT in ASO. Open chain ROM and strength out of ASO, standing balance and strength in ASO   ° °  °  ° °  ° ° °Patient will benefit from skilled therapeutic intervention in order to improve the following deficits and impairments:  Decreased range of motion, Difficulty walking, Pain, Hypomobility, Decreased mobility, Decreased strength ° °Visit Diagnosis: °Pain in left ankle and joints of left foot ° °Stiffness of left ankle, not elsewhere classified ° °Difficulty in walking, not elsewhere classified ° ° ° ° °Problem List °Patient Active Problem List  ° Diagnosis Date Noted  ° Generalized anxiety disorder 07/22/2021  ° PTSD (post-traumatic stress disorder) 07/22/2021  ° Closed nondisplaced fracture of fifth left metatarsal bone 05/01/2021  ° Blepharitis 03/26/2021  ° Patellofemoral arthritis of right knee 03/25/2018  ° Chronic pain of right knee 03/16/2018  ° Injury of cervical spine (HCC) 03/16/2018  ° Fatigue 07/02/2017  ° Hyperlipidemia, mixed 06/30/2016  ° Hyperglycemia 03/15/2016  ° Atypical chest pain 01/17/2016  ° RUQ pain 01/17/2016  ° Right hip pain  03/24/2015  ° Leukocytopenia 03/24/2015  ° Bursitis of left shoulder 03/22/2015  ° Greater trochanteric bursitis of right hip 03/22/2015  ° S/P laparotomy 06/14/2014  ° Diverticulosis of colon without hemorrhage  colonoscopy 2015 05/27/2014  ° Colon polyp 05/02/2014  ° Microscopic hematuria work up urology  see 01/2014 note 04/22/2014  ° Hypersomnia, recurrent   ° Dysarthria 05/01/2013  ° Mastodynia 10/13/2012  ° Mass of right breast 10/13/2012  ° Essential   hypertension, benign 06/29/2011  ° Migraines 06/29/2011  ° Anxiety 06/29/2011  ° Eosinophilic esophagitis 06/29/2011  ° Lupus anticoagulant positive 06/29/2011  ° ° °Ronald G Pemberton, PTA °08/05/2021, 12:32 PM ° °[place] °[facility] °[address] °[place], [place], [address] °Phone: [phone]   Fax:  [phone] ° °Name: Megan May °MRN: [id number] °Date of Birth: 12/26/1960 ° ° ° °

## 2021-08-08 ENCOUNTER — Encounter: Payer: Self-pay | Admitting: Family Medicine

## 2021-08-08 ENCOUNTER — Other Ambulatory Visit: Payer: Self-pay | Admitting: Family Medicine

## 2021-08-08 MED ORDER — AZITHROMYCIN 250 MG PO TABS: ORAL_TABLET | ORAL | 0 refills | Status: AC

## 2021-08-12 ENCOUNTER — Encounter: Payer: Self-pay | Admitting: Physical Therapy

## 2021-08-12 ENCOUNTER — Ambulatory Visit: Payer: BC Managed Care – PPO | Admitting: Physical Therapy

## 2021-08-12 ENCOUNTER — Other Ambulatory Visit: Payer: Self-pay

## 2021-08-12 ENCOUNTER — Ambulatory Visit: Payer: BC Managed Care – PPO | Admitting: Family

## 2021-08-12 DIAGNOSIS — M25572 Pain in left ankle and joints of left foot: Secondary | ICD-10-CM

## 2021-08-12 DIAGNOSIS — M25672 Stiffness of left ankle, not elsewhere classified: Secondary | ICD-10-CM

## 2021-08-12 NOTE — Therapy (Signed)
[place]. [place], [place], [address] Phone: [phone]   Fax:  [phone]  Physical Therapy Treatment  Patient Details  Name: Megan May MRN: [id number] Date of Birth: 12-Feb-1961 Referring Provider (PT): Martha Clan   Encounter Date: 08/12/2021   PT End of Session - 08/12/21 1554     Visit Number 4    Date for PT Re-Evaluation 08/25/21    Authorization Type BCBS    Authorization Time Period 07/14/21 to 08/25/21    PT Start Time 3568    PT Stop Time 1555    PT Time Calculation (min) 40 min    Activity Tolerance Patient tolerated treatment well    Behavior During Therapy [facility] for tasks assessed/performed             Past Medical History:  Diagnosis Date   Anxiety    Asthma    Depression    history of    Eosinophilic esophagitis    Fatigue 07/02/2017   Generalized anxiety disorder    Hyperglycemia 03/15/2016   Hyperlipidemia, mixed 06/30/2016   Hypersomnia, recurrent    Hypertension    Migraine    Migraines    Posttraumatic stress disorder     Past Surgical History:  Procedure Laterality Date   CESAREAN SECTION  2004   ESOPHAGOGASTRODUODENOSCOPY (EGD) WITH PROPOFOL N/A 01/12/2019   Procedure: ESOPHAGOGASTRODUODENOSCOPY (EGD) WITH PROPOFOL;  Surgeon: Carol Ada, MD;  Location: [facility];  Service: Endoscopy;  Laterality: N/A;   FRACTURE SURGERY  1973   R arm   LAPAROTOMY Bilateral 06/14/2014   Procedure: LAPAROTOMY with right  salpingo-oophorectomy and left salpingectomy;  Surgeon: Linda Hedges, DO;  Location: [facility];  Service: Gynecology;  Laterality: Bilateral;   TONSILLECTOMY AND ADENOIDECTOMY  1969    There were no vitals filed for this visit.   Subjective Assessment - 08/12/21 1515     Subjective Doing ok, tried to go two days without ankle brace, first day felt the outside of her foot was going to collapse, second day had pain all around    Currently in Pain?  No/denies                               [facility] Adult PT Treatment/Exercise - 08/12/21 0001       High Level Balance   High Level Balance Activities Side stepping;Tandem walking   on balance beam in parallel bars     Ankle Exercises: Aerobic   Recumbent Bike L2.6 x 5 min      Ankle Exercises: Seated   Other Seated Ankle Exercises Ankle 4 way green x 15    Other Seated Ankle Exercises sit to stands on airex 2x10      Ankle Exercises: Standing   Heel Raises Both;20 reps;2 seconds    Other Standing Ankle Exercises 8'' step ups x10 each from airex    Other Standing Ankle Exercises Toe and heel walking      Ankle Exercises: Machines for Strengthening   Cybex Leg Press Leg curls 35lb 2x10, Leg Ext 10lb 2x10                       PT Short Term Goals - 07/22/21 1339       PT SHORT TERM GOAL #1   Title Will be independent with appropriate HEP to be updated PRN    Status Achieved  PT Long Term Goals - 08/12/21 1554       PT LONG TERM GOAL #1   Title Pain in weight bearing will be no more than 2/10 at worst in order to improve activity tolerance and community access    Status Partially Met      PT LONG TERM GOAL #2   Title R ankle DF to be at least 15 degrees in order to improve gait mechanics and reduce pain                   Plan - 08/12/21 1555     Clinical Impression Statement Pt is doing well overall progressing towards LTG's. CGA needed at times with step ups form airex. Some ankle instability with side step and tandem walking on balance beam. Good strength with seated leg curls and extensions. No reports of increase pain.    Personal Factors and Comorbidities Fitness;Comorbidity 2;Sex;Time since onset of injury/illness/exacerbation    Examination-Activity Limitations Locomotion Level;Carry;Stairs    Examination-Participation Restrictions Cleaning;Occupation;Community Activity;Driving;Laundry;Yard Work     Stability/Clinical Decision Making Stable/Uncomplicated    Rehab Potential Good    PT Frequency 1x / week    PT Duration 6 weeks    PT Treatment/Interventions ADLs/Self Care Home Management;Cryotherapy;Electrical Stimulation;Iontophoresis 63m/ml Dexamethasone;Moist Heat;Ultrasound;Gait training;Stair training;Functional mobility training;Therapeutic activities;Therapeutic exercise;Balance training;Neuromuscular re-education;Patient/family education;Manual techniques;Passive range of motion;Dry needling;Taping    PT Next Visit Plan WBAT in ASO. Open chain ROM and strength out of ASO, standing balance and strength in ASO             Patient will benefit from skilled therapeutic intervention in order to improve the following deficits and impairments:  Decreased range of motion, Difficulty walking, Pain, Hypomobility, Decreased mobility, Decreased strength  Visit Diagnosis: Pain in left ankle and joints of left foot  Stiffness of left ankle, not elsewhere classified     Problem List Patient Active Problem List   Diagnosis Date Noted   Generalized anxiety disorder 07/22/2021   PTSD (post-traumatic stress disorder) 07/22/2021   Closed nondisplaced fracture of fifth left metatarsal bone 05/01/2021   Blepharitis 03/26/2021   Patellofemoral arthritis of right knee 03/25/2018   Chronic pain of right knee 03/16/2018   Injury of cervical spine ([place] 03/16/2018   Fatigue 07/02/2017   Hyperlipidemia, mixed 06/30/2016   Hyperglycemia 03/15/2016   Atypical chest pain 01/17/2016   RUQ pain 01/17/2016   Right hip pain 03/24/2015   Leukocytopenia 03/24/2015   Bursitis of left shoulder 03/22/2015   Greater trochanteric bursitis of right hip 03/22/2015   S/P laparotomy 06/14/2014   Diverticulosis of colon without hemorrhage  colonoscopy 2015 05/27/2014   Colon polyp 05/02/2014   Microscopic hematuria work up urology  see 01/2014 note 04/22/2014   Hypersomnia, recurrent    Dysarthria  05/01/2013   Mastodynia 10/13/2012   Mass of right breast 10/13/2012   Essential hypertension, benign 06/29/2011   Migraines 06/29/2011   Anxiety 122/33/6122  Eosinophilic esophagitis 144/97/5300  Lupus anticoagulant positive 06/29/2011    RScot Jun PTA 08/12/2021, 3:58 PM  [place] [address]Phone: [phone]  Fax:  [phone] Name: Megan SWEATTMRN: [id number] of Birth: [birth year]

## 2021-08-14 ENCOUNTER — Other Ambulatory Visit: Payer: Self-pay | Admitting: Family Medicine

## 2021-08-14 ENCOUNTER — Telehealth: Payer: Self-pay | Admitting: Family Medicine

## 2021-08-14 DIAGNOSIS — H00019 Hordeolum externum unspecified eye, unspecified eyelid: Secondary | ICD-10-CM

## 2021-08-14 NOTE — Telephone Encounter (Signed)
Pt would like a referral sent to [facility] - ophthalmologist [phone]. Advised pt appt is needed. Please advise.

## 2021-08-15 ENCOUNTER — Other Ambulatory Visit: Payer: Self-pay

## 2021-08-15 ENCOUNTER — Ambulatory Visit: Payer: BC Managed Care – PPO | Admitting: Family

## 2021-08-15 DIAGNOSIS — H35719 Central serous chorioretinopathy, unspecified eye: Secondary | ICD-10-CM

## 2021-08-15 NOTE — Telephone Encounter (Signed)
replaced

## 2021-08-19 ENCOUNTER — Ambulatory Visit (INDEPENDENT_AMBULATORY_CARE_PROVIDER_SITE_OTHER): Payer: BC Managed Care – PPO | Admitting: Professional

## 2021-08-19 ENCOUNTER — Encounter: Payer: Self-pay | Admitting: Professional

## 2021-08-19 DIAGNOSIS — F4310 Post-traumatic stress disorder, unspecified: Secondary | ICD-10-CM | POA: Diagnosis not present

## 2021-08-19 DIAGNOSIS — F411 Generalized anxiety disorder: Secondary | ICD-10-CM

## 2021-08-19 NOTE — Telephone Encounter (Signed)
[facility] called stating per Dr. Coralyn Pear, patient needs to go see a general ophthalmologist. For questions they can be reached at [phone].

## 2021-08-19 NOTE — Progress Notes (Signed)
[facility] Counselor/Therapist Progress Note  Patient ID: Megan May, MRN: [id number],    Date: 08/19/2021  Time Spent: 46 minutes 4-446 pm  Treatment Type: Individual Therapy  Reported Symptoms: concerned, anxious, sensitive  Mental Status Exam: Appearance:  Well Groomed     Behavior: Appropriate and Sharing  Motor: Normal  Speech/Language:  Clear and Coherent and Normal Rate  Affect: Full Range  Mood: normal  Thought process: normal  Thought content:   WNL  Sensory/Perceptual disturbances:   WNL  Orientation: oriented to person, place, time/date, and situation  Attention: Negative  Concentration: Good  Memory: WNL  Fund of knowledge:  Good  Insight:   Good  Judgment:  Good  Impulse Control: Good   Risk Assessment: Danger to Self:  No Self-injurious Behavior: No Danger to Others: No Duty to Warn:no Physical Aggression / Violence:No  Access to Firearms a concern: No  Gang Involvement:No   Subjective:  This session was held via video teletherapy due to the coronavirus risk at this time. The patient consented to video teletherapy and was located at her home during this session. She is aware it is the responsibility of the patient to secure confidentiality on her end of the session. The provider was in a private home office for the duration of this session.   Issues addressed: 1- mood -experienced some moderate anxiety related to worrying about spouse's behavior publicly for this birthday   -her daughter's parents were going to join them and she worried four nights -excited for her daughter   -Sydney's bf called and invited her to a movie and dinner 2-spirituality vs religion -uncomfortable learning that her daughter and her bf prayed before dinner -she thinks her daughter may be more "religious than either of [place]" -pt identities as Nurse, learning disability but does not believe what is taught   -disappointed with the indoctrination -she has anxiety about  religion and the potential to offend   -she is Catholic in name only, her spouse is jewish  Problems Addressed  Anxiety, Balancing Work and [employer], Low Self-Esteem/Lack of Assertiveness, Partner Relational Problems  Goals 1. Address and eliminate maladaptive thought processes that lead to anxious responses. 2. Develop a realistic perspective regarding demands and obligations of multiple roles and their completion. 3. Develop the necessary skills for effective, open communication and mutually satisfying intimacy. 4. increased awareness of own role in relationship conflicts. Objective Increase the frequency and quality of communication with the partner. Target Date: 2022-06-24 Frequency: Biweekly  Progress: 0 Modality: individual  Related Interventions Help the client to explore and clarify her own values and priorities; encourage her to reduce the psychological importance placed on one or more roles to reduce the level of conflict. Help the client to develop a realistic schedule that outlines the responsibilities of her partner and family members; help the client enlist the commitment of all individuals to the schedule. Explore, along with the client, strategies for making her life more manageable and less stressful (e.g., waking up before the children to exercise or have a cup of tea, pack the children's lunches and backpacks the night before). Obtain feedback on any difficulties the client experiences in application and provide tips for integrating methods into his/her everyday routine. Objective Each partner identifies her/his own role in the conflicts. Target Date: 2022-06-24 Frequency: Biweekly  Progress: 0 Modality: individual  Objective Identify the positive aspects of the relationship. Target Date: 2022-06-24 Frequency: Biweekly  Progress: 0 Modality: individual  Objective Utilize at least two new  conflict-resolution techniques to resolve issues reasonably. Target  Date: 2022-06-24 Frequency: Biweekly  Progress: 0 Modality: individual  5. Increase overall sense of well-being via reduction/elimination of anxiety. 6. Maintain a balance between the multiple demands of motherhood, work, and other life roles and responsibilities. Objective Generate a list of self-care activities and make a commitment to regularly participate in such activities. Target Date: 2022-06-24 Frequency: Biweekly  Progress: 0 Modality: individual  Objective Implement a schedule and division of home responsibilities with partner and family members. Target Date: 2022-06-24 Frequency: Biweekly  Progress: 0 Modality: individual  Objective Protect each role from interfering with the other. Target Date: 2022-06-24 Frequency: Biweekly  Progress: 0 Modality: individual  Objective Clarify values and priorities. Target Date: 2022-06-24 Frequency: Biweekly  Progress: 0 Modality: individual  7. Reduce self-disparaging remarks and negative self-talk. Objective Identify three anxiety-related fears associated with being assertive. Target Date: 2022-06-24 Frequency: Biweekly  Progress: 0 Modality: individual  Objective Recall situations in which beliefs and feelings were asserted. Target Date: 2022-06-24 Frequency: Biweekly  Progress: 0 Modality: individual  Objective Practice saying "no" to at least one person, declining to comply with a request/favor or identify and assert rights, needs, and wants. Target Date: 2022-06-24 Frequency: Biweekly  Progress: 0 Modality: individual  Objective Verbalize an understanding of the differences between passive, assertive, and aggressive behavior. Target Date: 2022-06-24 Frequency: Biweekly  Progress: 0 Modality: individual  8. Resolve issues involving low self-esteem or low self-efficacy that contribute to anxiety. Objective Self-correct maladaptive thought patterns preemptively in order to lessen or eliminate anxiety at least 90% of the time;  increase positive self-talk. Target Date: 2022-06-24 Frequency: Biweekly  Progress: 0 Modality: individual  Related Interventions Collaborate with the client to determine what kind of changes need to be made, internally and externally, for calmness and satisfaction to exist within the arenas of work, home, and school. Objective Acknowledge underlying irrational or illogical thought patterns that contribute to anxiety. Target Date: 2022-06-24 Frequency: Biweekly  Progress: 0 Modality: individual  Objective Learn and practice methods of reducing anxiety in a variety of situations. Target Date: 2022-06-24 Frequency: Biweekly  Progress: 0 Modality: individual  Objective Identify precipitating events, thoughts, feelings, and reactions that are believed to contribute to anxiety. Target Date: 2022-06-24 Frequency: Biweekly  Progress: 0 Modality: individual  Objective Verbalize anxiety symptoms as well as when, where, and how frequently they occur; describe attempts to resolve anxiety. Target Date: 2022-06-24 Frequency: Biweekly  Progress: 0 Modality: individual    Interventions: Assertiveness/Communication, Solution-Oriented/Positive Psychology, and Psycho-education/Bibliotherapy  Diagnosis:Generalized anxiety disorder  PTSD (post-traumatic stress disorder)  Plan:  -meet again on Saturday, September 06, 2021 at [place], [place], [facility]

## 2021-08-20 NOTE — Telephone Encounter (Signed)
Pt aware.

## 2021-08-27 NOTE — Progress Notes (Signed)
I, Wendy Poet, LAT, ATC, am serving as scribe for Dr. Lynne Leader.  NAJMO PARDUE is a 61 y.o. 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  She was last seen by Dr. Georgina Snell on 06/25/21 and noted improvement in her symptoms.  She was referred to PT and has completed 4 sessions.  She was transitioned from a CAM walking boot to an ASO-type ankle brace.  Today, pt reports approximately 85% improvement in her symptoms.  She locates her pain today to her L lateral foot and lateral ankle, foot>ankle.  She has more PT visits scheduled and has been shown a HEP.      Dx imaging: L foot XR- 06/25/21, 06/11/21 05/29/21 L foot & ankle XR 05/15/21 L foot & ankle XR 05/01/21 L foot & ankle XR (@ [facility]) Pertinent review of systems: No fevers or chills  Relevant historical information: Lupus anticoagulant.   Exam:  BP 124/80 (BP Location: Right Arm, Patient Position: Sitting, Cuff Size: Normal)    Pulse 68    Ht 5\' 7"  (1.702 m)    Wt 186 lb (84.4 kg)    LMP 05/10/2014 (Approximate)    SpO2 99%    BMI 29.13 kg/m  General: Well Developed, well nourished, and in no acute distress.   MSK: Left foot and ankle no significant swelling. Tender palpation proximal fifth metatarsal. Pain at lateral ankle and proximal fifth metatarsal with resisted foot eversion. Normal foot and ankle motion.  Stable ligamentous exam.  Pulses capillary fill and sensation are intact distally.    Lab and Radiology Results  X-ray images left foot obtained today personally and independently interpreted Proximal fifth metatarsal avulsion fracture has had significant healing per my interpretation.  There is still a small punched out or crescent shaped area at the edge of the bone remaining but the majority of the fracture  line has resolved. Await formal radiology review     Assessment and Plan: 61 y.o. female with left foot and ankle pain after a left ankle sprain and proximal fifth metatarsal avulsion fracture occurring in early October. This injury is now over 86 months old.  She did well with immobilization and then transitioning to physical therapy and ASO ankle brace.  She still having more pain than I would like.  Await radiology overread and plan for physical therapy.  If radiology is concern for nonunion we will proceed to bone stimulator if not check back in a month.  If still not sure consider advanced imaging such as CT scan or MRI.   PDMP not reviewed this encounter. Orders Placed This Encounter  Procedures   DG Foot Complete Left    Standing Status:   Future    Number of Occurrences:   1    Standing Expiration Date:   09/28/2021    Order Specific Question:   Reason for Exam (SYMPTOM  OR DIAGNOSIS REQUIRED)    Answer:   L foot pain    Order Specific Question:   Is patient pregnant?    Answer:   No    Order Specific Question:   Preferred imaging location?    Answer:   [facility]   No orders of the defined types were placed in this encounter.    Discussed warning signs or symptoms.  Please see discharge instructions. Patient expresses understanding.   The above documentation has been reviewed and is accurate and complete Lynne Leader, M.D.

## 2021-08-28 ENCOUNTER — Other Ambulatory Visit: Payer: Self-pay

## 2021-08-28 ENCOUNTER — Ambulatory Visit (INDEPENDENT_AMBULATORY_CARE_PROVIDER_SITE_OTHER): Payer: BC Managed Care – PPO

## 2021-08-28 ENCOUNTER — Ambulatory Visit: Payer: BC Managed Care – PPO | Admitting: Family Medicine

## 2021-08-28 ENCOUNTER — Encounter: Payer: Self-pay | Admitting: Family Medicine

## 2021-08-28 VITALS — BP 124/80 | HR 68 | Ht 67.0 in | Wt 186.0 lb

## 2021-08-28 DIAGNOSIS — S92355D Nondisplaced fracture of fifth metatarsal bone, left foot, subsequent encounter for fracture with routine healing: Secondary | ICD-10-CM | POA: Diagnosis not present

## 2021-08-28 NOTE — Patient Instructions (Addendum)
Good to see you today.  Con't PT and your home exercises.  Please get an Xray today before you leave.  Follow-up: one month

## 2021-08-29 ENCOUNTER — Ambulatory Visit: Payer: BC Managed Care – PPO | Admitting: Physical Therapy

## 2021-08-29 ENCOUNTER — Encounter: Payer: Self-pay | Admitting: Physical Therapy

## 2021-08-29 DIAGNOSIS — M25572 Pain in left ankle and joints of left foot: Secondary | ICD-10-CM

## 2021-08-29 DIAGNOSIS — R262 Difficulty in walking, not elsewhere classified: Secondary | ICD-10-CM

## 2021-08-29 DIAGNOSIS — M25672 Stiffness of left ankle, not elsewhere classified: Secondary | ICD-10-CM

## 2021-08-29 NOTE — Therapy (Signed)
[place]. [place], [place], [address] Phone: [phone]   Fax:  [phone]  Physical Therapy Treatment  Patient Details  Name: Megan May MRN: [id number] Date of Birth: 19-Nov-1960 Referring Provider (PT): Lynne Leader   Encounter Date: 08/29/2021   PT End of Session - 08/29/21 1150     Visit Number 5    Number of Visits 9    Date for PT Re-Evaluation 09/26/21    Authorization Type BCBS    Authorization Time Period 07/14/21 to 08/25/21; extended to 2/24    PT Start Time 1101    PT Stop Time 1143    PT Time Calculation (min) 42 min    Activity Tolerance Patient tolerated treatment well    Behavior During Therapy [facility] for tasks assessed/performed             Past Medical History:  Diagnosis Date   Anxiety    Asthma    Depression    history of    Eosinophilic esophagitis    Fatigue 07/02/2017   Generalized anxiety disorder    Hyperglycemia 03/15/2016   Hyperlipidemia, mixed 06/30/2016   Hypersomnia, recurrent    Hypertension    Migraine    Migraines    Posttraumatic stress disorder     Past Surgical History:  Procedure Laterality Date   CESAREAN SECTION  2004   ESOPHAGOGASTRODUODENOSCOPY (EGD) WITH PROPOFOL N/A 01/12/2019   Procedure: ESOPHAGOGASTRODUODENOSCOPY (EGD) WITH PROPOFOL;  Surgeon: Carol Ada, MD;  Location: [facility];  Service: Endoscopy;  Laterality: N/A;   FRACTURE SURGERY  1973   R arm   LAPAROTOMY Bilateral 06/14/2014   Procedure: LAPAROTOMY with right  salpingo-oophorectomy and left salpingectomy;  Surgeon: Linda Hedges, DO;  Location: [facility];  Service: Gynecology;  Laterality: Bilateral;   TONSILLECTOMY AND ADENOIDECTOMY  1969    There were no vitals filed for this visit.   Subjective Assessment - 08/29/21 1106     Subjective I saw the MD yesterday, they took xrays and he still wants me to wear the ASO for weight bearing. It feels like the outside of my  foot is caving in when I'm not in the ASO. Sometimes I have no ankle pain, sometimes it radiates over to the other side of the foot. He said one more month in the brace.    Patient Stated Goals get rid of pain, be able to walk in something other than flat shoe    Currently in Pain? No/denies   getting up to 4/10 at worst when walking or doing steps; takes 24 hours to calm down, this was daily until this week               [facility] PT Assessment - 08/29/21 0001       Assessment   Referring Provider (PT) Lynne Leader    Onset Date/Surgical Date --   September 22   Next MD Visit Georgina Snell on February 23    Prior Therapy yes      Precautions   Precautions Other (comment)    Precaution Comments WBAT in ASO      Restrictions   Weight Bearing Restrictions Yes    Other Position/Activity Restrictions WBAT      Balance Screen   Has the patient fallen in the past 6 months Yes    How many times? fall that caused injury    Has the patient had a decrease in activity level because of a fear of falling?  No    Is the patient reluctant to leave their home because of a fear of falling?  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    Leisure going to shows and concerts      AROM   Left Ankle Dorsiflexion 3    Left Ankle Plantar Flexion 62    Left Ankle Inversion 38    Left Ankle Eversion 23      Strength   Left Knee Flexion 4/5    Left Knee Extension 5/5    Left Ankle Dorsiflexion 5/5    Left Ankle Inversion 5/5    Left Ankle Eversion 5/5      Palpation   Palpation comment resolved, no significnat trigger points noted                           [facility] Adult PT Treatment/Exercise - 08/29/21 0001       Ankle Exercises: Aerobic   Nustep L4 x5 minutes BLEs only      Ankle  Exercises: Standing   Other Standing Ankle Exercises 10 second SLS on physiodisc x3                     PT Education - 08/29/21 1149     Education Details reassessment findings, PT reccs and POC moving forward, HEP updates    Person(s) Educated Patient    Methods Explanation;Handout    Comprehension Verbalized understanding;Returned demonstration              PT Short Term Goals - 08/29/21 1115       PT SHORT TERM GOAL #1   Title Will be independent with appropriate HEP to be updated PRN    Baseline 1/27- going OK, the only ones I don't do every day are the band. Hard to get to, others are easy to do where ever    Time 1    Period Weeks    Status Achieved               PT Long Term Goals - 08/29/21 1116       PT LONG TERM GOAL #1   Title Pain in weight bearing will be no more than 2/10 at worst in order to improve activity tolerance and community access    Baseline 1/27- mixed, pain had been up to 4/10 until this week    Time 6    Period Weeks    Status Partially Met      PT LONG TERM GOAL #2   Title R ankle DF to be at least 15 degrees in order to improve gait mechanics and reduce pain    Baseline 1/27- still limited    Time 6    Period Weeks    Status On-going      PT LONG TERM GOAL #3   Title Will be able to maintain SLS for at least 15 seconds and tandem stance for at least 30 seconds in order to show improved functional balance    Baseline 1/27- no issue    Time 6    Period Weeks    Status Achieved    Target Date 10/11/18      PT LONG TERM GOAL #4  Title Will be able to return to gym based routine with pain no more than 2/10 in order to improve overall fitness and cardiovascular health, QOL    Baseline 1/27- has not tried but more limited by schedule/busy day at work    Time [place] - 08/29/21 [place] Windy arrives today doing OK- she has  been feeling pretty good the past week, but up until now she had still been having a lot of pain. I do think we are making slow progress forward, she is only able to come 1x/week due to high copay and because of this I think it is important she continue with therapy especially since her ankle feels like its going to collapse when not in ASO. Need to continue working on general ankle stability moving forward. I did extend her POC into February- will take another look at progress as we get closer to her next appt with Dr. Georgina Snell.    Personal Factors and Comorbidities Fitness;Comorbidity 2;Sex;Time since onset of injury/illness/exacerbation    Examination-Activity Limitations Locomotion Level;Carry;Stairs    Examination-Participation Restrictions Cleaning;Occupation;Community Activity;Driving;Laundry;Yard Work    Stability/Clinical Decision Making Stable/Uncomplicated    Clinical Decision Making Low    Rehab Potential Good    PT Frequency 1x / week    PT Duration 4 weeks    PT Treatment/Interventions ADLs/Self Care Home Management;Cryotherapy;Electrical Stimulation;Iontophoresis 76m/ml Dexamethasone;Moist Heat;Ultrasound;Gait training;Stair training;Functional mobility training;Therapeutic activities;Therapeutic exercise;Balance training;Neuromuscular re-education;Patient/family education;Manual techniques;Passive range of motion;Dry needling;Taping    PT Next Visit Plan continue to progress as tolerated; need to update HEP more regularly    PT Home Exercise Plan [email or web address]    Consulted and Agree with Plan of Care Patient             Patient will benefit from skilled therapeutic intervention in order to improve the following deficits and impairments:  Decreased range of motion, Difficulty walking, Pain, Hypomobility, Decreased mobility, Decreased strength  Visit Diagnosis: Pain in left ankle and joints of left foot  Stiffness of left ankle, not elsewhere classified  Difficulty in walking, not  elsewhere classified     Problem List Patient Active Problem List   Diagnosis Date Noted   Generalized anxiety disorder 07/22/2021   PTSD (post-traumatic stress disorder) 07/22/2021   Closed nondisplaced fracture of fifth left metatarsal bone 05/01/2021   Blepharitis 03/26/2021   Patellofemoral arthritis of right knee 03/25/2018   Chronic pain of right knee 03/16/2018   [place] 03/16/[place]   Fatigue 07/02/2017   Hyperlipidemia, mixed 06/30/2016   Hyperglycemia 03/15/2016   Atypical chest pain 01/17/2016   RUQ pain 01/17/2016   Right hip pain 03/24/2015   Leukocytopenia 03/24/2015   Bursitis of left shoulder 03/22/2015   Greater trochanteric bursitis of right hip 03/22/2015   S/P laparotomy 06/14/2014   Diverticulosis of colon without hemorrhage  colonoscopy 2015 05/27/2014   Colon polyp 05/02/2014   Microscopic hematuria work up urology  see 01/2014 note 04/22/2014   Hypersomnia, recurrent    Dysarthria 05/01/2013   Mastodynia 10/13/2012   Mass of right breast 10/13/2012   Essential hypertension, benign 06/29/2011   Migraines 06/29/2011   Anxiety 157/08/7791  Eosinophilic esophagitis 190/30/0923  Lupus anticoagulant positive 06/29/2011   KAnn LionsPT, DPT, PN2   Supplemental Physical Therapist [facility]  Pager [phone] Acute Rehab Office [place]. [place], [place], [address] Phone: [phone]   Fax:  [phone]  Name: TONICA BRASINGTON MRN: [id number] Date of Birth: 03/21/1961

## 2021-09-01 NOTE — Progress Notes (Signed)
Foot fracture is healing but slowly.

## 2021-09-03 ENCOUNTER — Other Ambulatory Visit: Payer: Self-pay

## 2021-09-03 ENCOUNTER — Ambulatory Visit: Payer: BC Managed Care – PPO | Attending: Family Medicine | Admitting: Physical Therapy

## 2021-09-03 ENCOUNTER — Encounter: Payer: Self-pay | Admitting: Physical Therapy

## 2021-09-03 DIAGNOSIS — R262 Difficulty in walking, not elsewhere classified: Secondary | ICD-10-CM | POA: Diagnosis present

## 2021-09-03 DIAGNOSIS — M25672 Stiffness of left ankle, not elsewhere classified: Secondary | ICD-10-CM | POA: Diagnosis present

## 2021-09-03 DIAGNOSIS — M25572 Pain in left ankle and joints of left foot: Secondary | ICD-10-CM | POA: Insufficient documentation

## 2021-09-03 NOTE — Therapy (Signed)
[place] [place], [place], [address] Phone: [phone]   Fax:  [phone]  Physical Therapy Treatment  Patient Details  Name: Megan May MRN: [id number] Date of Birth: [birth year] Referring Provider (PT): Lynne Leader   Encounter Date: 09/03/2021   PT End of Session - 09/03/21 1635     Visit Number 6    PT Start Time 1600    PT Stop Time 4680    PT Time Calculation (min) 43 min    Activity Tolerance Patient tolerated treatment well             Past Medical History:  Diagnosis Date   Anxiety    Asthma    Depression    history of    Eosinophilic esophagitis    Fatigue 07/02/2017   Generalized anxiety disorder    Hyperglycemia 03/15/2016   Hyperlipidemia, mixed 06/30/2016   Hypersomnia, recurrent    Hypertension    Migraine    Migraines    Posttraumatic stress disorder     Past Surgical History:  Procedure Laterality Date   CESAREAN SECTION  2004   ESOPHAGOGASTRODUODENOSCOPY (EGD) WITH PROPOFOL N/A 01/12/2019   Procedure: ESOPHAGOGASTRODUODENOSCOPY (EGD) WITH PROPOFOL;  Surgeon: Carol Ada, MD;  Location: [facility];  Service: Endoscopy;  Laterality: N/A;   FRACTURE SURGERY  1973   R arm   LAPAROTOMY Bilateral 06/14/2014   Procedure: LAPAROTOMY with right  salpingo-oophorectomy and left salpingectomy;  Surgeon: Linda Hedges, DO;  Location: [facility];  Service: Gynecology;  Laterality: Bilateral;   TONSILLECTOMY AND ADENOIDECTOMY  1969    There were no vitals filed for this visit.   Subjective Assessment - 09/03/21 1605     Subjective Doing better, does not feel like the L side of her foot is caving in    Currently in Pain? No/denies                               [facility] Adult PT Treatment/Exercise - 09/03/21 0001       High Level Balance   High Level Balance Comments LLE SLS on airex 4x10''      Exercises   Exercises Ankle;Knee/Hip      Knee/Hip  Exercises: Machines for Strengthening   Cybex Knee Extension 10lb 2x10    Cybex Knee Flexion 35lb 2x10      Knee/Hip Exercises: Standing   Other Standing Knee Exercises SL DL 5lb x10 x5      Ankle Exercises: Aerobic   Recumbent Bike L2.6 x 5 min      Ankle Exercises: Machines for Strengthening   Cybex Leg Press 40lb 2x10      Ankle Exercises: Standing   Heel Raises Both;20 reps;2 seconds    Other Standing Ankle Exercises 6in box on airex forward and lateral step ups x10 each      Ankle Exercises: Seated   Other Seated Ankle Exercises sit to stands on airex 2x10   5lb dumbbell                      PT Short Term Goals - 08/29/21 1115       PT SHORT TERM GOAL #1   Title Will be independent with appropriate HEP to be updated PRN    Baseline 1/27- going OK, the only ones I don't do every day are the band. Hard to get to, others are easy to do  where ever    Time 1    Period Weeks    Status Achieved               PT Long Term Goals - 08/29/21 1116       PT LONG TERM GOAL #1   Title Pain in weight bearing will be no more than 2/10 at worst in order to improve activity tolerance and community access    Baseline 1/27- mixed, pain had been up to 4/10 until this week    Time 6    Period Weeks    Status Partially Met      PT LONG TERM GOAL #2   Title R ankle DF to be at least 15 degrees in order to improve gait mechanics and reduce pain    Baseline 1/27- still limited    Time 6    Period Weeks    Status On-going      PT LONG TERM GOAL #3   Title Will be able to maintain SLS for at least 15 seconds and tandem stance for at least 30 seconds in order to show improved functional balance    Baseline 1/27- no issue    Time 6    Period Weeks    Status Achieved    Target Date 10/11/18      PT LONG TERM GOAL #4   Title Will be able to return to gym based routine with pain no more than 2/10 in order to improve overall fitness and cardiovascular health, QOL     Baseline 1/27- has not tried but more limited by schedule/busy day at work    Time 6    Period Weeks    Status On-going                   Plan - 09/03/21 1635     Clinical Impression Statement Pt again enters clinic feeling well. She report less pain in her ankle since last session. Continued with LE strength and ankle stability. SLS on airex did pose a challenge for pt requiring her to lose balance multiple of times during each set. Bilateral LE fatigue present with step ups and sit to stands. No issues with the addition of the leg press.    Personal Factors and Comorbidities Fitness;Comorbidity 2;Sex;Time since onset of injury/illness/exacerbation    Examination-Activity Limitations Locomotion Level;Carry;Stairs    Examination-Participation Restrictions Cleaning;Occupation;Community Activity;Driving;Laundry;Yard Work    Stability/Clinical Decision Making Stable/Uncomplicated    Rehab Potential Good    PT Frequency 1x / week    PT Duration 4 weeks    PT Treatment/Interventions ADLs/Self Care Home Management;Cryotherapy;Electrical Stimulation;Iontophoresis 73m/ml Dexamethasone;Moist Heat;Ultrasound;Gait training;Stair training;Functional mobility training;Therapeutic activities;Therapeutic exercise;Balance training;Neuromuscular re-education;Patient/family education;Manual techniques;Passive range of motion;Dry needling;Taping    PT Next Visit Plan continue to progress as tolerated; need to update HEP more regularly             Patient will benefit from skilled therapeutic intervention in order to improve the following deficits and impairments:  Decreased range of motion, Difficulty walking, Pain, Hypomobility, Decreased mobility, Decreased strength  Visit Diagnosis: Pain in left ankle and joints of left foot  Stiffness of left ankle, not elsewhere classified  Difficulty in walking, not elsewhere classified     Problem List Patient Active Problem List   Diagnosis Date  Noted   Generalized anxiety disorder 07/22/2021   PTSD (post-traumatic stress disorder) 07/22/2021   Closed nondisplaced fracture of fifth left metatarsal bone 05/01/2021   Blepharitis 03/26/2021  Patellofemoral arthritis of right knee 03/25/2018   Chronic pain of right knee 03/16/2018   Injury of cervical spine ([place]) 03/16/2018   Fatigue 07/02/2017   Hyperlipidemia, mixed 06/30/2016   Hyperglycemia 03/15/2016   Atypical chest pain 01/17/2016   RUQ pain 01/17/2016   Right hip pain 03/24/2015   Leukocytopenia 03/24/2015   Bursitis of left shoulder 03/22/2015   Greater trochanteric bursitis of right hip 03/22/2015   S/P laparotomy 06/14/2014   Diverticulosis of colon without hemorrhage  colonoscopy 2015 05/27/2014   Colon polyp 05/02/2014   Microscopic hematuria work up urology  see 01/2014 note 04/22/2014   Hypersomnia, recurrent    Dysarthria 05/01/2013   Mastodynia 10/13/2012   Mass of right breast 10/13/2012   Essential hypertension, benign 06/29/2011   Migraines 06/29/2011   Anxiety 49/44/9675   Eosinophilic esophagitis 91/63/8466   Lupus anticoagulant positive 06/29/2011    Scot Jun, PTA 09/03/2021, 4:40 PM  [place]. [place], [place], [address] Phone: [phone]   Fax:  [phone]  Name: Megan May MRN: [id number] Date of Birth: 1961/01/11

## 2021-09-06 ENCOUNTER — Encounter: Payer: Self-pay | Admitting: Professional

## 2021-09-06 ENCOUNTER — Ambulatory Visit (INDEPENDENT_AMBULATORY_CARE_PROVIDER_SITE_OTHER): Payer: BC Managed Care – PPO | Admitting: Professional

## 2021-09-06 DIAGNOSIS — F411 Generalized anxiety disorder: Secondary | ICD-10-CM | POA: Diagnosis not present

## 2021-09-06 DIAGNOSIS — F4310 Post-traumatic stress disorder, unspecified: Secondary | ICD-10-CM

## 2021-09-06 NOTE — Progress Notes (Signed)
[place] Counselor/Therapist Progress Note  Patient ID: Megan May, MRN: [id number],    Date: 09/06/2021  Time Spent: 46 minutes 4-446 pm  Treatment Type: Individual Therapy  Reported Symptoms: frustrated, sad, overwhelmed, helpless, hopeless  Mental Status Exam: Appearance:  Well Groomed     Behavior: Appropriate and Sharing  Motor: Normal  Speech/Language:  Clear and Coherent and Normal Rate  Affect: Full Range  Mood: sad  Thought process: normal  Thought content:   WNL  Sensory/Perceptual disturbances:   WNL  Orientation: oriented to person, place, time/date, and situation  Attention: Negative  Concentration: Good  Memory: WNL  Fund of knowledge:  Good  Insight:   Good  Judgment:  Good  Impulse Control: Good   Risk Assessment: Danger to Self:  No Self-injurious Behavior: No Danger to Others: No Duty to Warn:no Physical Aggression / Violence:No  Access to Firearms a concern: No  Gang Involvement:No   Subjective:  This session was held via video teletherapy (webex) due to the coronavirus risk at this time. The patient consented to video teletherapy and was located at her home during this session. She is aware it is the responsibility of the patient to secure confidentiality on her end of the session. The provider was in a private home office for the duration of this session.   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 because the student has been permitted to do whatever she wants   -child has minimal boundaries   -teacher blocks pt's professional  recommendations, corrects her, and is not following the IEP -pt feels she cannot do anything about it   -"it will anger her (the teacher) even more"   -the teacher has bullied her -she has been working with this teacher for four years and has happened the entire time -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 but does not want to have to talk to this teacher -pt cannot find any control in this situation -she is taking out her powerlessness at work out on her family 3-problem solving -discussed opportunities to change her situation   -go to Medical laboratory scientific officer for peer support   -go to Librarian, academic and disclose issus   -do nothing   -talk to teacher again   -quit her job   -evaluate time she actually spends with teacher     -change perspective     -put on different hats depending upon situation       -put on a hard hat to enter situation and stay focused on child's needs       -remove hat when leaving and put you professional hat back on for other interactions that are less difficult  Problems Addressed  Anxiety, Balancing Work and Family/Multiple Roles, Low Self-Esteem/Lack of Assertiveness, Partner Relational Problems  Goals 1. Address and eliminate maladaptive thought processes that lead to anxious responses. 2. Develop a realistic perspective regarding demands and obligations of multiple roles and their completion. 3. Develop the necessary skills for effective, open communication and mutually satisfying intimacy. 4. increased awareness of own role in relationship conflicts. Objective Increase the frequency  and quality of communication with the partner. Target Date: 2022-06-24 Frequency: Biweekly  Progress: 0 Modality: individual  Related Interventions Help the client to explore and clarify her own values and priorities; encourage her to reduce the psychological importance placed on one or more roles to reduce the level of conflict. Help the client to develop a realistic schedule that  outlines the responsibilities of her partner and family members; help the client enlist the commitment of all individuals to the schedule. Explore, along with the client, strategies for making her life more manageable and less stressful (e.g., waking up before the children to exercise or have a cup of tea, pack the children's lunches and backpacks the night before). Obtain feedback on any difficulties the client experiences in application and provide tips for integrating methods into his/her everyday routine. Objective Each partner identifies her/his own role in the conflicts. Target Date: 2022-06-24 Frequency: Biweekly  Progress: 0 Modality: individual  Objective Identify the positive aspects of the relationship. Target Date: 2022-06-24 Frequency: Biweekly  Progress: 0 Modality: individual  Objective Utilize at least two new conflict-resolution techniques to resolve issues reasonably. Target Date: 2022-06-24 Frequency: Biweekly  Progress: 0 Modality: individual  5. Increase overall sense of well-being via reduction/elimination of anxiety. 6. Maintain a balance between the multiple demands of motherhood, work, and other life roles and responsibilities. Objective Generate a list of self-care activities and make a commitment to regularly participate in such activities. Target Date: 2022-06-24 Frequency: Biweekly  Progress: 0 Modality: individual  Objective Implement a schedule and division of home responsibilities with partner and family members. Target Date: 2022-06-24 Frequency: Biweekly  Progress: 0 Modality: individual  Objective Protect each role from interfering with the other. Target Date: 2022-06-24 Frequency: Biweekly  Progress: 0 Modality: individual  Objective Clarify values and priorities. Target Date: 2022-06-24 Frequency: Biweekly  Progress: 0 Modality: individual  7. Reduce self-disparaging remarks and negative self-talk. Objective Identify three anxiety-related fears  associated with being assertive. Target Date: 2022-06-24 Frequency: Biweekly  Progress: 0 Modality: individual  Objective Recall situations in which beliefs and feelings were asserted. Target Date: 2022-06-24 Frequency: Biweekly  Progress: 0 Modality: individual  Objective Practice saying "no" to at least one person, declining to comply with a request/favor or identify and assert rights, needs, and wants. Target Date: 2022-06-24 Frequency: Biweekly  Progress: 0 Modality: individual  Objective Verbalize an understanding of the differences between passive, assertive, and aggressive behavior. Target Date: 2022-06-24 Frequency: Biweekly  Progress: 0 Modality: individual  8. Resolve issues involving low self-esteem or low self-efficacy that contribute to anxiety. Objective Self-correct maladaptive thought patterns preemptively in order to lessen or eliminate anxiety at least 90% of the time; increase positive self-talk. Target Date: 2022-06-24 Frequency: Biweekly  Progress: 0 Modality: individual  Related Interventions Collaborate with the client to determine what kind of changes need to be made, internally and externally, for calmness and satisfaction to exist within the arenas of work, home, and school. Objective Acknowledge underlying irrational or illogical thought patterns that contribute to anxiety. Target Date: 2022-06-24 Frequency: Biweekly  Progress: 0 Modality: individual  Objective Learn and practice methods of reducing anxiety in a variety of situations. Target Date: 2022-06-24 Frequency: Biweekly  Progress: 0 Modality: individual  Objective Identify precipitating events, thoughts, feelings, and reactions that are believed to contribute to anxiety. Target Date: 2022-06-24 Frequency: Biweekly  Progress: 0 Modality: individual  Objective Verbalize anxiety symptoms as well as when, where, and how frequently they occur; describe attempts to resolve anxiety. Target Date:  2022-06-24 Frequency: Biweekly  Progress: 0 Modality: individual   Diagnosis:Generalized anxiety disorder  PTSD (post-traumatic stress disorder)  Plan:  -pt plans to try and use the strategy of wearing different hats -meet again on Saturday, September 13, 2021 at [place], [facility]

## 2021-09-08 ENCOUNTER — Ambulatory Visit: Payer: BC Managed Care – PPO | Admitting: Physical Therapy

## 2021-09-13 ENCOUNTER — Ambulatory Visit: Payer: BC Managed Care – PPO | Admitting: Professional

## 2021-09-16 ENCOUNTER — Encounter: Payer: Self-pay | Admitting: Professional

## 2021-09-16 ENCOUNTER — Ambulatory Visit (INDEPENDENT_AMBULATORY_CARE_PROVIDER_SITE_OTHER): Payer: BC Managed Care – PPO | Admitting: Professional

## 2021-09-16 DIAGNOSIS — F411 Generalized anxiety disorder: Secondary | ICD-10-CM

## 2021-09-16 DIAGNOSIS — F4310 Post-traumatic stress disorder, unspecified: Secondary | ICD-10-CM

## 2021-09-16 NOTE — Progress Notes (Signed)
[place] Counselor/Therapist Progress Note  Patient ID: Megan May, MRN: [id number],    Date: 09/16/2021  Time Spent: 45 minutes 4-445 pm  Treatment Type: Individual Therapy  Reported Symptoms: exhaustion  Mental Status Exam: Appearance:  Well Groomed     Behavior: Appropriate and Sharing  Motor: Normal  Speech/Language:  Clear and Coherent and Normal Rate  Affect: Full Range  Mood: sad  Thought process: normal  Thought content:   WNL  Sensory/Perceptual disturbances:   WNL  Orientation: oriented to person, place, time/date, and situation  Attention: Negative  Concentration: Good  Memory: WNL  Fund of knowledge:  Good  Insight:   Good  Judgment:  Good  Impulse Control: Good   Risk Assessment: Danger to Self:  No Self-injurious Behavior: No Danger to Others: No Duty to Warn:no Physical Aggression / Violence:No  Access to Firearms a concern: No  Gang Involvement:No   Subjective:  This session was held via video teletherapy (webex) due to the coronavirus risk at this time. The patient consented to video teletherapy and was located at her home during this session. She is aware it is the responsibility of the patient to secure confidentiality on her end of the session. The provider was in a private home office for the duration of this session.   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 -pt continues to have issues with the teacher and assistant -teacher's assistant spends all her time cleaning and toileting -another aide in the classroom overheard and looked at pt   -same thing has happened to her but she cries -pt plans to manage Thursday's meeting and focusing on the child -pt does not want to return to the same school   -she has had four years with this person and nothing has changed   -teacher is all about asserting power 3-daughter  -participating in  fundraising event -[employer] -she is in one of five couples participating -pt is unsure if she is excited because she is so focused on her boyfriend -daughter struggling to complete tasks at home and at work -needs flexibility for daughter's work schedule   -she cannot leave her alone for extended period of time 4-lacks control -over most areas of her life -try letting go of what she can/can't control  Problems Addressed  Anxiety, Balancing Work and [employer], Low Self-Esteem/Lack of Assertiveness, Partner Relational Problems  Goals 1. Address and eliminate maladaptive thought processes that lead to anxious responses. 2. Develop a realistic perspective regarding demands and obligations of multiple roles and their completion. 3. Develop the necessary skills for effective, open communication and mutually satisfying intimacy. 4. increased awareness of own role in relationship conflicts. Objective Increase the frequency and quality of communication with the partner. Target Date: 2022-06-24 Frequency: Biweekly  Progress: 0 Modality: individual  Related Interventions Help the client to explore and clarify her own values and priorities; encourage her to reduce the psychological importance placed on one or more roles to reduce the level of conflict. Help the client to develop a realistic schedule that outlines the responsibilities of her partner and family members; help the client enlist the commitment of all individuals to the schedule. Explore, along with the client, strategies for making her life more manageable and less stressful (e.g., waking up before the children to exercise or have a cup of tea, pack the children's lunches and backpacks the night before). Obtain feedback on any difficulties  the client experiences in application and provide tips for integrating methods into his/her everyday routine. Objective Each partner identifies her/his own role in the  conflicts. Target Date: 2022-06-24 Frequency: Biweekly  Progress: 0 Modality: individual  Objective Identify the positive aspects of the relationship. Target Date: 2022-06-24 Frequency: Biweekly  Progress: 0 Modality: individual  Objective Utilize at least two new conflict-resolution techniques to resolve issues reasonably. Target Date: 2022-06-24 Frequency: Biweekly  Progress: 0 Modality: individual  5. Increase overall sense of well-being via reduction/elimination of anxiety. 6. Maintain a balance between the multiple demands of motherhood, work, and other life roles and responsibilities. Objective Generate a list of self-care activities and make a commitment to regularly participate in such activities. Target Date: 2022-06-24 Frequency: Biweekly  Progress: 0 Modality: individual  Objective Implement a schedule and division of home responsibilities with partner and family members. Target Date: 2022-06-24 Frequency: Biweekly  Progress: 0 Modality: individual  Objective Protect each role from interfering with the other. Target Date: 2022-06-24 Frequency: Biweekly  Progress: 0 Modality: individual  Objective Clarify values and priorities. Target Date: 2022-06-24 Frequency: Biweekly  Progress: 0 Modality: individual  7. Reduce self-disparaging remarks and negative self-talk. Objective Identify three anxiety-related fears associated with being assertive. Target Date: 2022-06-24 Frequency: Biweekly  Progress: 0 Modality: individual  Objective Recall situations in which beliefs and feelings were asserted. Target Date: 2022-06-24 Frequency: Biweekly  Progress: 0 Modality: individual  Objective Practice saying "no" to at least one person, declining to comply with a request/favor or identify and assert rights, needs, and wants. Target Date: 2022-06-24 Frequency: Biweekly  Progress: 0 Modality: individual  Objective Verbalize an understanding of the differences between passive,  assertive, and aggressive behavior. Target Date: 2022-06-24 Frequency: Biweekly  Progress: 0 Modality: individual  8. Resolve issues involving low self-esteem or low self-efficacy that contribute to anxiety. Objective Self-correct maladaptive thought patterns preemptively in order to lessen or eliminate anxiety at least 90% of the time; increase positive self-talk. Target Date: 2022-06-24 Frequency: Biweekly  Progress: 0 Modality: individual  Related Interventions Collaborate with the client to determine what kind of changes need to be made, internally and externally, for calmness and satisfaction to exist within the arenas of work, home, and school. Objective Acknowledge underlying irrational or illogical thought patterns that contribute to anxiety. Target Date: 2022-06-24 Frequency: Biweekly  Progress: 0 Modality: individual  Objective Learn and practice methods of reducing anxiety in a variety of situations. Target Date: 2022-06-24 Frequency: Biweekly  Progress: 0 Modality: individual  Objective Identify precipitating events, thoughts, feelings, and reactions that are believed to contribute to anxiety. Target Date: 2022-06-24 Frequency: Biweekly  Progress: 0 Modality: individual  Objective Verbalize anxiety symptoms as well as when, where, and how frequently they occur; describe attempts to resolve anxiety. Target Date: 2022-06-24 Frequency: Biweekly  Progress: 0 Modality: individual   Diagnosis:Generalized anxiety disorder  PTSD (post-traumatic stress disorder)  Plan:  -meet again on Saturday, October 04, 2021 at [facility], [place], [facility]

## 2021-09-22 ENCOUNTER — Ambulatory Visit: Payer: BC Managed Care – PPO | Admitting: Physical Therapy

## 2021-09-22 ENCOUNTER — Encounter: Payer: Self-pay | Admitting: Physical Therapy

## 2021-09-22 ENCOUNTER — Other Ambulatory Visit: Payer: Self-pay

## 2021-09-22 DIAGNOSIS — R262 Difficulty in walking, not elsewhere classified: Secondary | ICD-10-CM

## 2021-09-22 DIAGNOSIS — M25672 Stiffness of left ankle, not elsewhere classified: Secondary | ICD-10-CM

## 2021-09-22 DIAGNOSIS — M25572 Pain in left ankle and joints of left foot: Secondary | ICD-10-CM

## 2021-09-22 NOTE — Therapy (Signed)
[place] [place], [place], [address] Phone: [phone]   Fax:  [phone]  Physical Therapy Treatment  Patient Details  Name: Megan May MRN: [id number] Date of Birth: 1960-08-25 Referring Provider (PT): Lynne Leader   Encounter Date: 09/22/2021   PT End of Session - 09/22/21 1220     Visit Number 7    Date for PT Re-Evaluation 09/26/21    Authorization Time Period 07/14/21 to 08/25/21; extended to 2/24    PT Start Time 1145    PT Stop Time 1220    PT Time Calculation (min) 35 min    Activity Tolerance Patient tolerated treatment well    Behavior During Therapy [facility] for tasks assessed/performed             Past Medical History:  Diagnosis Date   Anxiety    Asthma    Depression    history of    Eosinophilic esophagitis    Fatigue 07/02/2017   Generalized anxiety disorder    Hyperglycemia 03/15/2016   Hyperlipidemia, mixed 06/30/2016   Hypersomnia, recurrent    Hypertension    Migraine    Migraines    Posttraumatic stress disorder     Past Surgical History:  Procedure Laterality Date   CESAREAN SECTION  2004   ESOPHAGOGASTRODUODENOSCOPY (EGD) WITH PROPOFOL N/A 01/12/2019   Procedure: ESOPHAGOGASTRODUODENOSCOPY (EGD) WITH PROPOFOL;  Surgeon: Carol Ada, MD;  Location: [facility];  Service: Endoscopy;  Laterality: N/A;   FRACTURE SURGERY  1973   R arm   LAPAROTOMY Bilateral 06/14/2014   Procedure: LAPAROTOMY with right  salpingo-oophorectomy and left salpingectomy;  Surgeon: Linda Hedges, DO;  Location: [facility];  Service: Gynecology;  Laterality: Bilateral;   TONSILLECTOMY AND ADENOIDECTOMY  1969    There were no vitals filed for this visit.   Subjective Assessment - 09/22/21 1147     Subjective "I took the brace off, Im tired of it. I thinks its good, Im not 100% but it's like 90 %"    Currently in Pain? Yes    Pain Score 1     Pain Location Ankle    Pain Orientation  Left;Lateral                [facility] PT Assessment - 09/22/21 0001       AROM   Left Ankle Dorsiflexion 16    Left Ankle Plantar Flexion 63    Left Ankle Inversion 40    Left Ankle Eversion 31                           [facility] Adult PT Treatment/Exercise - 09/22/21 0001       Knee/Hip Exercises: Aerobic   Recumbent Bike L 3.5 x6 min      Knee/Hip Exercises: Machines for Strengthening   Cybex Knee Extension 10lb 2x10    Cybex Knee Flexion 35lb 2x10    Cybex Leg Press 20lb 2x15                       PT Short Term Goals - 08/29/21 1115       PT SHORT TERM GOAL #1   Title Will be independent with appropriate HEP to be updated PRN    Baseline 1/27- going OK, the only ones I don't do every day are the band. Hard to get to, others are easy to do where ever    Time  1    Period Weeks    Status Achieved               PT Long Term Goals - 09/22/21 1202       PT LONG TERM GOAL #1   Title Pain in weight bearing will be no more than 2/10 at worst in order to improve activity tolerance and community access    Status Achieved      PT LONG TERM GOAL #2   Title R ankle DF to be at least 15 degrees in order to improve gait mechanics and reduce pain    Status Achieved      PT LONG TERM GOAL #3   Title Will be able to maintain SLS for at least 15 seconds and tandem stance for at least 30 seconds in order to show improved functional balance    Status Achieved      PT LONG TERM GOAL #4   Title Will be able to return to gym based routine with pain no more than 2/10 in order to improve overall fitness and cardiovascular health, QOL    Status On-going   has not returned, not related to ankle                  Plan - 09/22/21 1221     Clinical Impression Statement Pt enters clinic reporting improvement overall. she stated that's he has stopped wearing her ankle brace because it almost made her fall twice. She did well with all  interventions. She has progressed improving her L ankle AROM in all directions. She is pleased with her current functional stated. Pt reports that she has an MD apt this week. Advised pt to make one mote PT apt after her MD apt incase MD thinks she needed it    Personal Factors and Comorbidities Fitness;Comorbidity 2;Sex;Time since onset of injury/illness/exacerbation    Examination-Activity Limitations Locomotion Level;Carry;Stairs    Examination-Participation Restrictions Cleaning;Occupation;Community Activity;Driving;Laundry;Yard Work    Stability/Clinical Decision Making Stable/Uncomplicated    Rehab Potential Good    PT Frequency 1x / week    PT Treatment/Interventions ADLs/Self Care Home Management;Cryotherapy;Electrical Stimulation;Iontophoresis 4mg /ml Dexamethasone;Moist Heat;Ultrasound;Gait training;Stair training;Functional mobility training;Therapeutic activities;Therapeutic exercise;Balance training;Neuromuscular re-education;Patient/family education;Manual techniques;Passive range of motion;Dry needling;Taping    PT Next Visit Plan continue to progress as tolerated; need to update HEP more regularly             Patient will benefit from skilled therapeutic intervention in order to improve the following deficits and impairments:  Decreased range of motion, Difficulty walking, Pain, Hypomobility, Decreased mobility, Decreased strength  Visit Diagnosis: Pain in left ankle and joints of left foot  Difficulty in walking, not elsewhere classified  Stiffness of left ankle, not elsewhere classified     Problem List Patient Active Problem List   Diagnosis Date Noted   Generalized anxiety disorder 07/22/2021   PTSD (post-traumatic stress disorder) 07/22/2021   Closed nondisplaced fracture of fifth left metatarsal bone 05/01/2021   Blepharitis 03/26/2021   Patellofemoral arthritis of right knee 03/25/2018   Chronic pain of right knee 03/16/2018   Injury of cervical spine ([place])  03/16/2018   Fatigue 07/02/2017   Hyperlipidemia, mixed 06/30/2016   Hyperglycemia 03/15/2016   Atypical chest pain 01/17/2016   RUQ pain 01/17/2016   Right hip pain 03/24/2015   Leukocytopenia 03/24/2015   Bursitis of left shoulder 03/22/2015   Greater trochanteric bursitis of right hip 03/22/2015   S/P laparotomy 06/14/2014   Diverticulosis of colon without  hemorrhage  colonoscopy 2015 05/27/2014   Colon polyp 05/02/2014   Microscopic hematuria work up urology  see 01/2014 note 04/22/2014   Hypersomnia, recurrent    Dysarthria 05/01/2013   Mastodynia 10/13/2012   Mass of right breast 10/13/2012   Essential hypertension, benign 06/29/2011   Migraines 06/29/2011   Anxiety 44/73/9584   Eosinophilic esophagitis 41/71/2787   Lupus anticoagulant positive 06/29/2011    Scot Jun, PTA 09/22/2021, 12:27 PM  [place]. [place], [place], [address] Phone: [phone]   Fax:  [phone]  Name: Megan May MRN: [id number] Date of Birth: [birth year]

## 2021-09-25 ENCOUNTER — Encounter: Payer: Self-pay | Admitting: Family Medicine

## 2021-09-25 ENCOUNTER — Other Ambulatory Visit: Payer: Self-pay

## 2021-09-25 ENCOUNTER — Ambulatory Visit (INDEPENDENT_AMBULATORY_CARE_PROVIDER_SITE_OTHER): Payer: BC Managed Care – PPO | Admitting: Family Medicine

## 2021-09-25 VITALS — BP 140/80 | HR 63 | Ht 67.0 in | Wt 186.0 lb

## 2021-09-25 DIAGNOSIS — M25572 Pain in left ankle and joints of left foot: Secondary | ICD-10-CM

## 2021-09-25 DIAGNOSIS — S92355D Nondisplaced fracture of fifth metatarsal bone, left foot, subsequent encounter for fracture with routine healing: Secondary | ICD-10-CM

## 2021-09-25 NOTE — Progress Notes (Signed)
° °  I, Wendy Poet, LAT, ATC, am serving as scribe for Dr. Lynne Leader.  Megan May is a 61 y.o. 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  She was last seen by Dr. Georgina Snell on 08/28/21 and was advised to cont PT, completing 7 total visits. Today, pt reports that her L foot is feeling better, rating her improvement at 90%.  Dx imaging: 08/28/21 L foot XR 06/25/21 L foot XR 06/11/21 L foot XR  05/29/21 L foot & ankle XR 05/15/21 L foot & ankle XR 05/01/21 L foot & ankle XR (@ [facility])  Pertinent review of systems: No fevers or chills  Relevant historical information: Lupus anticoagulant positive   Exam:  BP 140/80 (BP Location: Right Arm, Patient Position: Sitting, Cuff Size: Normal)    Pulse 63    Ht 5\' 7"  (1.702 m)    Wt 186 lb (84.4 kg)    LMP 05/10/2014 (Approximate)    SpO2 96%    BMI 29.13 kg/m  General: Well Developed, well nourished, and in no acute distress.   MSK: Left ankle minimally tender at ATFL region. Normal foot and ankle motion Left foot nontender at proximal fifth metatarsal    Lab and Radiology Results EXAM: LEFT FOOT - COMPLETE 3+ VIEW   COMPARISON:  06/25/2021   FINDINGS: Subtle linear lucency with adjacent healing sclerosis within the base of fifth metatarsal again comes close to the proximal articular surface. Moderate to high-grade healing is similar to prior.   Minimal peripheral great toe metatarsophalangeal joint degenerative spurring is similar to prior. Joint spaces are maintained. No dislocation.   IMPRESSION: Moderate to high-grade healing of oblique fracture of the base of the fifth metatarsal.     Electronically Signed   By: Yvonne Kendall M.D.   On: 08/29/2021 11:45 I, Lynne Leader, personally (independently) visualized and  performed the interpretation of the images attached in this note.      Assessment and Plan: 61 y.o. female with left ankle pain significantly improved with PT.  She is about 90% better now.  Plan to continue PT and transition to home exercise program and check back as needed.  The fifth metatarsal fracture is improving on x-ray at the last visit and clinically completely resolved.  Recheck back as needed     Discussed warning signs or symptoms. Please see discharge instructions. Patient expresses understanding.   The above documentation has been reviewed and is accurate and complete Lynne Leader, M.D.

## 2021-09-25 NOTE — Patient Instructions (Addendum)
Good to see you today.  Glad you're feeling so much better.  Finish PT and con't your home exercises.  Follow-up as needed.

## 2021-09-30 ENCOUNTER — Ambulatory Visit: Payer: BC Managed Care – PPO | Admitting: Physical Therapy

## 2021-10-01 ENCOUNTER — Encounter: Payer: Self-pay | Admitting: Family Medicine

## 2021-10-01 ENCOUNTER — Ambulatory Visit (INDEPENDENT_AMBULATORY_CARE_PROVIDER_SITE_OTHER): Payer: BC Managed Care – PPO | Admitting: Family Medicine

## 2021-10-01 VITALS — BP 118/66 | HR 68 | Temp 98.7°F | Ht 67.0 in | Wt 181.1 lb

## 2021-10-01 DIAGNOSIS — H6981 Other specified disorders of Eustachian tube, right ear: Secondary | ICD-10-CM

## 2021-10-01 MED ORDER — PREDNISONE 20 MG PO TABS: 20.0000 mg | ORAL_TABLET | Freq: Every day | ORAL | 0 refills | Status: AC

## 2021-10-01 NOTE — Patient Instructions (Signed)
OK to use Debrox (peroxide) in the ear to loosen up wax. Also recommend using a bulb syringe (for removing boogers from baby's noses) to flush through warm water and vinegar (3-4:1 ratio). An alternative, though more expensive, is an elephant ear washer wax removal kit. Do not use Q-tips as this can impact wax further. ? ?Use Flonase daily for the next week. If no improvement or worsening, use the prednisone for 5 days.  ? ?Let us know if you need anything. ?

## 2021-10-01 NOTE — Progress Notes (Signed)
Chief Complaint  ?Patient presents with  ? Ear Pain  ?  right  ? ? ?Pt is here for right ear pain. ?Duration: today ?Progression: unchanged ?Associated symptoms: None ?Denies: congestion, fevers, post nasal drip, bleeding, or discharge from ear ?Treatment to date: none ? ?Past Medical History:  ?Diagnosis Date  ? Anxiety   ? Asthma   ? Depression   ? history of   ? Eosinophilic esophagitis   ? Fatigue 07/02/2017  ? Generalized anxiety disorder   ? Hyperglycemia 03/15/2016  ? Hyperlipidemia, mixed 06/30/2016  ? Hypersomnia, recurrent   ? Hypertension   ? Migraine   ? Migraines   ? Posttraumatic stress disorder   ? ? ?BP 118/66   Pulse 68   Temp 98.7 ?F (37.1 ?C) (Oral)   Ht 5\' 7"  (1.702 m)   Wt 181 lb 2 oz (82.2 kg)   LMP 05/10/2014 (Approximate)   SpO2 97%   BMI 28.37 kg/m?  ?General: Awake, alert, appearing stated age ?HEENT:  ?L ear- Canal patent without drainage or erythema, TM is neg ?R ear- canal initially 100% obstructed w wax; after removal, canal patent without drainage or erythema, TM is neg ?Nose- nares patent and without discharge ?Mouth- Lips, gums and dentition unremarkable, pharynx is without erythema or exudate ?Neck: No adenopathy ?Lungs: Normal effort, no accessory muscle use ?Psych: Age appropriate judgment and insight, normal mood and affect ? ?Procedure note: Cerumen removal instrumentation ?Verbal consent obtained. ?Robin Ewing, CMA performed procedure. ?A mixture of warm water and Dulcolax was used to irrigate ear. ?After attempts failed, I personally used a transparent currette with an attached light source for removal.  ?Cerumen successfully removed. ?Pt reported some improvement. ?Pt tolerated procedure well. ?There were no immediate complications noted. ? ? ?Dysfunction of right eustachian tube ? ?Flonase daily for a week. If no improvement, I have sent in a 5 d prednisone burst 40 mg/d. Home cerumen care discussed and written down. F/u w pcp as originally scheduled.  ?Pt voiced  understanding and agreement to the plan. ? ?Shelda Pal, DO ?10/01/21 ?1:58 PM ? ?

## 2021-10-03 ENCOUNTER — Encounter: Payer: Self-pay | Admitting: Professional

## 2021-10-03 ENCOUNTER — Ambulatory Visit (INDEPENDENT_AMBULATORY_CARE_PROVIDER_SITE_OTHER): Payer: BC Managed Care – PPO | Admitting: Professional

## 2021-10-03 DIAGNOSIS — F4310 Post-traumatic stress disorder, unspecified: Secondary | ICD-10-CM

## 2021-10-03 DIAGNOSIS — F411 Generalized anxiety disorder: Secondary | ICD-10-CM

## 2021-10-03 NOTE — Progress Notes (Signed)
[facility] Counselor/Therapist Progress Note ? ?Patient ID: Megan May, MRN: [id number],   ? ?Date: 10/03/2021 ? ?Time Spent: 45 minutes 07-1244 pm ? ?Treatment Type: Individual Therapy ? ?Mental Status Exam: ?Appearance:  Well Groomed     ?Behavior: Appropriate and Sharing  ?Motor: Normal  ?Speech/Language:  Clear and Coherent and Normal Rate  ?Affect: Full Range  ?Mood: frustrated  ?Thought process: normal  ?Thought content:   WNL  ?Sensory/Perceptual disturbances:   WNL  ?Orientation: oriented to person, place, time/date, and situation  ?Attention: Negative  ?Concentration: Good  ?Memory: WNL  ?Fund of knowledge:  Good  ?Insight:   Good  ?Judgment:  Good  ?Impulse Control: Good  ? ?Risk Assessment: ?Danger to Self:  No ?Self-injurious Behavior: No ?Danger to Others: No ?Duty to Warn:no ?Physical Aggression / Violence:No  ?Access to Firearms a concern: No  ?Gang Involvement:No  ? ?Subjective:  This session was held via video teletherapy (webex) due to the coronavirus risk at this time. The patient consented to video teletherapy and was located in her car during this session. She is aware it is the responsibility of the patient to secure confidentiality on her end of the session. The provider was in a private home office for the duration of this session.  ? ?The patient arrived on time for her webex appointment. ? 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 ?Problems Addressed  ?Anxiety, Balancing Work and [employer], Low Self-Esteem/Lack of Assertiveness, Partner Relational  Problems ? ?Goals ?1. Address and eliminate maladaptive thought processes that lead to anxious responses. ?2. Develop a realistic perspective regarding demands and obligations of multiple roles and their completion. ?3. Develop the necessary skills for effective, open communication and mutually satisfying intimacy. ?4. increased awareness of own role in relationship conflicts. ?Objective ?Increase the frequency and quality of communication with the partner. ?Target Date: 2022-06-24 Frequency: Biweekly  ?Progress: 0 Modality: individual  ?Related Interventions ?Help the client to explore and clarify her own values and priorities; encourage her to reduce the psychological importance placed on one or more roles to reduce the level of conflict. ?Help the client to develop a realistic schedule that outlines the responsibilities of her partner and family members; help the client enlist the commitment of all individuals to the schedule. ?Explore, along with the client, strategies for making her life more manageable and less stressful (e.g., waking up before the children to exercise or have a cup of tea, pack the children's lunches and backpacks the night before). ?Obtain feedback on any difficulties the client experiences in application and provide tips for integrating methods into his/her everyday routine. ?Objective ?Each partner identifies her/his own role in the conflicts. ?Target Date: 2022-06-24 Frequency: Biweekly  ?Progress: 0 Modality: individual  ?Objective ?Identify the positive aspects of the relationship. ?Target Date: 2022-06-24 Frequency: Biweekly  ?Progress: 0 Modality: individual  ?Objective ?Utilize at least two new conflict-resolution techniques to resolve issues reasonably. ?Target Date: 2022-06-24 Frequency: Biweekly  ?Progress: 0 Modality: individual  ?5. Increase overall sense of well-being via reduction/elimination of anxiety. ?6. Maintain a balance between the multiple demands of motherhood, work,  and other life  roles and responsibilities. ?Objective ?Generate a list of self-care activities and make a commitment to regularly participate in such activities. ?Target Date: 2022-06-24 Frequency: Biweekly  ?Progress: 0 Modality: individual  ?Objective ?Implement a schedule and division of home responsibilities with partner and family members. ?Target Date: 2022-06-24 Frequency: Biweekly  ?Progress: 0 Modality: individual  ?Objective ?Protect each role from interfering with the other. ?Target Date: 2022-06-24 Frequency: Biweekly  ?Progress: 0 Modality: individual  ?Objective ?Clarify values and priorities. ?Target Date: 2022-06-24 Frequency: Biweekly  ?Progress: 0 Modality: individual  ?7. Reduce self-disparaging remarks and negative self-talk. ?Objective ?Identify three anxiety-related fears associated with being assertive. ?Target Date: 2022-06-24 Frequency: Biweekly  ?Progress: 0 Modality: individual  ?Objective ?Recall situations in which beliefs and feelings were asserted. ?Target Date: 2022-06-24 Frequency: Biweekly  ?Progress: 0 Modality: individual  ?Objective ?Practice saying "no" to at least one person, declining to comply with a request/favor or identify and assert rights, needs, and wants. ?Target Date: 2022-06-24 Frequency: Biweekly  ?Progress: 0 Modality: individual  ?Objective ?Verbalize an understanding of the differences between passive, assertive, and aggressive behavior. ?Target Date: 2022-06-24 Frequency: Biweekly  ?Progress: 0 Modality: individual  ?8. Resolve issues involving low self-esteem or low self-efficacy that contribute to anxiety. ?Objective ?Self-correct maladaptive thought patterns preemptively in order to lessen or eliminate anxiety at least 90% of the time; increase positive self-talk. ?Target Date: 2022-06-24 Frequency: Biweekly  ?Progress: 0 Modality: individual  ?Related Interventions ?Collaborate with the client to determine what kind of changes need to be made, internally and  externally, for calmness and satisfaction to exist within the arenas of work, home, and school. ?Objective ?Acknowledge underlying irrational or illogical thought patterns that contribute to anxiety. ?Target Date: 2022-06-24 Frequency: Biweekly  ?Progress: 0 Modality: individual  ?Objective ?Learn and practice methods of reducing anxiety in a variety of situations. ?Target Date: 2022-06-24 Frequency: Biweekly  ?Progress: 0 Modality: individual  ?Objective ?Identify precipitating events, thoughts, feelings, and reactions that are believed to contribute to anxiety. ?Target Date: 2022-06-24 Frequency: Biweekly  ?Progress: 0 Modality: individual  ?Objective ?Verbalize anxiety symptoms as well as when, where, and how frequently they occur; describe attempts to resolve anxiety. ?Target Date: 2022-06-24 Frequency: Biweekly  ?Progress: 0 Modality: individual  ? ?Diagnosis:Generalized anxiety disorder ? ?PTSD (post-traumatic stress disorder) ? ?Plan:  ?-meet again on Tuesday, October 14, 2021 at 4pm ? ?Francie Massing, [facility] ?

## 2021-10-04 ENCOUNTER — Ambulatory Visit: Payer: BC Managed Care – PPO | Admitting: Professional

## 2021-10-12 ENCOUNTER — Encounter: Payer: Self-pay | Admitting: Family Medicine

## 2021-10-14 ENCOUNTER — Ambulatory Visit: Payer: BC Managed Care – PPO | Admitting: Professional

## 2021-10-28 ENCOUNTER — Ambulatory Visit: Payer: BC Managed Care – PPO | Admitting: Professional

## 2021-11-03 ENCOUNTER — Encounter: Payer: Self-pay | Admitting: Family Medicine

## 2021-11-03 ENCOUNTER — Ambulatory Visit (INDEPENDENT_AMBULATORY_CARE_PROVIDER_SITE_OTHER): Payer: BC Managed Care – PPO | Admitting: Family Medicine

## 2021-11-03 VITALS — BP 130/76 | HR 67 | Temp 97.0°F | Resp 95 | Ht 67.0 in | Wt 184.4 lb

## 2021-11-03 DIAGNOSIS — B029 Zoster without complications: Secondary | ICD-10-CM

## 2021-11-03 DIAGNOSIS — R21 Rash and other nonspecific skin eruption: Secondary | ICD-10-CM | POA: Diagnosis not present

## 2021-11-03 HISTORY — DX: Zoster without complications: B02.9

## 2021-11-03 MED ORDER — VALACYCLOVIR HCL 1 G PO TABS: 1000.0000 mg | ORAL_TABLET | Freq: Two times a day (BID) | ORAL | 0 refills | Status: AC

## 2021-11-03 NOTE — Patient Instructions (Signed)
Painful rash may be shingles.  Not sure at the moment.  You will be fine to be around others as long as it is covered.  This excludes unvaccinated people which are usually very young or pertinent individuals.  Take ibuprofen as needed for pain.  Take Val acyclovir as prescribed.  Return if worsening ?

## 2021-11-03 NOTE — Progress Notes (Incomplete)
? ?Cardiology Office Note ? ?Date:  11/03/2021  ? ?ID:  Megan May, DOB [birth year], MRN [id number] ? ?PCP:  Mosie Lukes, MD  ?Cardiologist:   Orion Crook  ? ?No chief complaint on file. ? ?  ?History of Present Illness: ?Megan May is a 61 y.o. female with hypertension, eosinophilic esophagitis, depression and anxiety who presents for follow up.   She was first seen on 02/2016 for chest discomfort.  She was referred for ETT.  However when she got there her blood pressure was too high so it was cancelled.  She was started on lisinopril 20 mg daily.  She had dizziness but this seems to have improved.  She stopped taking HCTZ-triamterene but then noted that increased lower extremity edema.  She followed up with Tana Coast, PharmD, and was started on HCTZ.  She had and echo 03/09/16 that revealed LVEF 65-70% and was otherwise unremarkable.  She underwent ETT 05/2016 that was negative for ischemia.  She achieved 11.7 METs on a Bruce protocol.   ? ?At her last appointment, Megan May was struggling with fatigue from working 12-hour shifts. She reported occasional chest pain usually at rest. She had one near-syncopal episode after standing. At the time she was no taking lisinopril as instructed. Her chest pain was atypical and unchanged, so she did not have a repeat ischemic evaluation. She was seen by Kerin Ransom, PA-C in 2020 and reported spikes in blood pressure under stressful situations. She saw Bunnie Domino, NP in 2021 and was well. Today, she is feeling okay overall and her breathing has been fine. Since her last visit she has gained 10 lbs. 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 She believes the fracture is healing, but there was little change on repeat X-ray. At this time she has not been able to return to her baseline routines. Her blood pressure is monitored during PT, and has averaged [phone]  systolic. In her diet she uses pink Himalayan salt as a salt substitute. She denies any palpitations, chest pain, or shortness of breath. No lightheadedness, headaches, syncope, orthopnea, PND, lower extremity edema or exertional symptoms. ? ? ?Past Medical History:  ?Diagnosis Date  ? Anxiety   ? Asthma   ? Depression   ? history of   ? Eosinophilic esophagitis   ? Fatigue 07/02/2017  ? Generalized anxiety disorder   ? Hyperglycemia 03/15/2016  ? Hyperlipidemia, mixed 06/30/2016  ? Hypersomnia, recurrent   ? Hypertension   ? Migraine   ? Migraines   ? Posttraumatic stress disorder   ? ? ?Past Surgical History:  ?Procedure Laterality Date  ? CESAREAN SECTION  2004  ? ESOPHAGOGASTRODUODENOSCOPY (EGD) WITH PROPOFOL N/A 01/12/2019  ? Procedure: ESOPHAGOGASTRODUODENOSCOPY (EGD) WITH PROPOFOL;  Surgeon: Carol Ada, MD;  Location: [facility];  Service: Endoscopy;  Laterality: N/A;  ? FRACTURE SURGERY  1973  ? R arm  ? LAPAROTOMY Bilateral 06/14/2014  ? Procedure: LAPAROTOMY with right  salpingo-oophorectomy and left salpingectomy;  Surgeon: Linda Hedges, DO;  Location: [facility];  Service: Gynecology;  Laterality: Bilateral;  ? [place]  ? ? ? ?Current Outpatient Medications  ?Medication Sig Dispense Refill  ? lisinopril (ZESTRIL) 20 MG tablet Take 1 tablet (20 mg total) by mouth daily. 90 tablet 3  ? omeprazole (PRILOSEC) 40 MG capsule Take 40 mg by mouth daily.    ? ?No current facility-administered medications for this visit.  ? ? ?  Allergies:   Amoxicillin-pot clavulanate, Doxycycline, Levaquin [levofloxacin in d5w], Clindamycin, Clindamycin/lincomycin, Lactose intolerance (gi), Metoprolol, Penicillin g, Aspirin, Latex, and Penicillins  ? ? ?Social History:  The patient  reports that she has never smoked. She has never used smokeless tobacco. She reports that she does not drink alcohol and does not use drugs.  ? ?Family History:  The patient's family history includes CAD in her father;  Diabetes in her brother, father, and paternal grandmother; Heart disease in her father; Hypertension in her father; Other in her cousin; Stroke in her maternal grandfather and maternal grandmother.  ? ? ?ROS:   ?Please see the history of present illness. ?(+) Fracture of 5th metatarsal in left foot ?All other systems are reviewed and negative.  ? ? ?PHYSICAL EXAM: ?VS:  LMP 05/10/2014 (Approximate)  , BMI There is no height or weight on file to calculate BMI. ?GENERAL:  Well appearing ?HEENT: Pupils equal round and reactive, fundi not visualized, oral mucosa unremarkable ?NECK:  No jugular venous distention, waveform within normal limits, carotid upstroke brisk and symmetric, no bruits, no thyromegaly ?LUNGS:  Clear to auscultation bilaterally ?HEART:  RRR.  PMI not displaced or sustained,S1 and S2 within normal limits, no S3, no S4, no clicks, no rubs, no murmurs ?ABD:  Flat, positive bowel sounds normal in frequency in pitch, no bruits, no rebound, no guarding, no midline pulsatile mass, no hepatomegaly, no splenomegaly ?EXT:  2 plus pulses throughout, no edema, no cyanosis no clubbing ?SKIN:  No rashes no nodules ?NEURO:  Cranial nerves II through XII grossly intact, motor grossly intact throughout ?PSYCH:  Cognitively intact, oriented to person place and time ? ?EKG:   ?07/10/2021: Sinus bradycardia. Rate 59 bpm. ?07/02/17: Sinus rhythm.  Rate 61 bpm.  Non-specific T wave abnormalities.   ?02/27/16: sinus bradycardia. Rate 59 bpm. ? ?ETT 05/26/16: ?Blood pressure demonstrated a hypertensive response to exercise. ?There was no ST segment deviation noted during stress. ?  ?ETT with good exercise tolerance (10:00); no chest pain; hypertensive BP response; no diagnostic ST changes; negative adequate ETT; Duke treadmill score 10. ? ?Echo 03/09/16: ?Study Conclusions ?- Left ventricle: The cavity size was normal. Systolic function was ?  vigorous. The estimated ejection fraction was in the range of 65% ?  to 70%. Wall  motion was normal; there were no regional wall ?  motion abnormalities. Left ventricular diastolic function ?  parameters were normal. ?- Aortic valve: Transvalvular velocity was within the normal range. ?  There was no stenosis. There was no regurgitation. ?- Mitral valve: Transvalvular velocity was within the normal range. ?  There was no evidence for stenosis. There was trivial ?  regurgitation. ?- Right ventricle: The cavity size was normal. Wall thickness was ?  normal. Systolic function was normal. ?- Atrial septum: No defect or patent foramen ovale was identified ?  by color flow Doppler. ?- Tricuspid valve: There was trivial regurgitation. ?- Pulmonary arteries: Systolic pressure was within the normal ?  range. PA peak pressure: 18 mm Hg (S). ? ?Carotid u/s 05/02/13: ?IMPRESSION: ?  ?1.  Trace noncalcified atherosclerotic plaque in the left carotid ?bifurcation resulting in less than 50% diameter stenosis. ?2.  Unremarkable right carotid artery. ?3.  Bilateral vertebral arteries are patent with normal antegrade ?flow. ? ? ?Recent Labs: ?No results found for requested labs within last 8760 hours.  ? ? ?Lipid Panel ?   ?Component Value Date/Time  ? CHOL 199 06/18/2016 1624  ? TRIG 194.0 (H) 06/18/2016 1624  ?  HDL 61.00 06/18/2016 1624  ? CHOLHDL 3 06/18/2016 1624  ? VLDL 38.8 06/18/2016 1624  ? [place] 99 06/18/2016 1624  ? ?  ? ?Wt Readings from Last 3 Encounters:  ?10/01/21 181 lb 2 oz (82.2 kg)  ?09/25/21 186 lb (84.4 kg)  ?08/28/21 186 lb (84.4 kg)  ?  ? ? ?ASSESSMENT AND PLAN: ? ?No problem-specific Assessment & Plan notes found for this encounter. ? ? ? ?Current medicines are reviewed at length with the patient today.  The patient does not have concerns regarding medicines. ? ?The following changes have been made:  no change ? ?Labs/ tests ordered today include:  ? ?No orders of the defined types were placed in this encounter. ? ? ?Disposition:   ?FU with Tiffany C. Oval Linsey, MD, [facility] in 3 months.   ? ?I,Mathew Stumpf,acting as a scribe for Skeet Latch, MD.,have documented all relevant documentation on the behalf of Skeet Latch, MD,as directed by  Skeet Latch, MD while in the presence of Tiffany R

## 2021-11-03 NOTE — Progress Notes (Signed)
? ?Acute Office Visit ? ?Subjective:  ? ? Patient ID: Megan May, female    DOB: 22-Nov-1960, 61 y.o.   MRN: [id number] ? ?Chief Complaint  ?Patient presents with  ? Acute Visit  ?  Pt c/o painful rash on right of abdomen and back symptoms started Saturday.  ? ? ?Patient presents with rash for 3 days.  It is on the abdomen and the back.  It is painful.  It has not been getting worse.  Has not blistered.  Patient not having fevers.  Patient does have a history of chickenpox.  Patient is unvaccinated shingles.  Pain is constant.  Patient not tried anything for pain yet.  Patient did put Eucerin on it without improvement ? ?Past Medical History:  ?Diagnosis Date  ? Anxiety   ? Asthma   ? Depression   ? history of   ? Eosinophilic esophagitis   ? Fatigue 07/02/2017  ? Generalized anxiety disorder   ? Hyperglycemia 03/15/2016  ? Hyperlipidemia, mixed 06/30/2016  ? Hypersomnia, recurrent   ? Hypertension   ? Migraine   ? Migraines   ? Posttraumatic stress disorder   ? ? ?Past Surgical History:  ?Procedure Laterality Date  ? CESAREAN SECTION  2004  ? ESOPHAGOGASTRODUODENOSCOPY (EGD) WITH PROPOFOL N/A 01/12/2019  ? Procedure: ESOPHAGOGASTRODUODENOSCOPY (EGD) WITH PROPOFOL;  Surgeon: Carol Ada, MD;  Location: [facility];  Service: Endoscopy;  Laterality: N/A;  ? FRACTURE SURGERY  1973  ? R arm  ? LAPAROTOMY Bilateral 06/14/2014  ? Procedure: LAPAROTOMY with right  salpingo-oophorectomy and left salpingectomy;  Surgeon: Linda Hedges, DO;  Location: [facility];  Service: Gynecology;  Laterality: Bilateral;  ? [place]  ? ? ?Family History  ?Problem Relation Age of Onset  ? Hypertension Father   ? Heart disease Father   ? Diabetes Father   ? CAD Father   ? Stroke Maternal Grandfather   ? Stroke Maternal Grandmother   ? Diabetes Paternal Grandmother   ? Other Cousin   ?     bicuspid aortic valve  ? Diabetes Brother   ? ? ?Social History  ? ?Socioeconomic History  ? Marital status: Married   ?  Spouse name: Randall Hiss  ? Number of children: 1  ? Years of education: 2 Masters  ? Highest education level: Not on file  ?Occupational History  ? Occupation: SPECIAL EDUCATOR  ?  Employer: [employer]  ?  Comment: Teacher, early years/pre  ?Tobacco Use  ? Smoking status: Never  ? Smokeless tobacco: Never  ?Substance and Sexual Activity  ? Alcohol use: No  ? Drug use: No  ? Sexual activity: Yes  ?  Birth control/protection: Surgical  ?Other Topics Concern  ? Not on file  ?Social History Narrative  ? Patient is right handed, resides in home with husband and daughter. She consumes 16 oz of tea daily.  ? 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  ? ?Social Determinants of Health  ? ?Financial Resource Strain: Not on file  ?Food Insecurity: Not on file  ?Transportation Needs: Not on file  ?Physical Activity: Not on file  ?Stress: Not on file  ?Social Connections: Not on file  ?Intimate Partner Violence: Not on file  ? ? ?Outpatient Medications Prior to Visit  ?Medication Sig Dispense Refill  ? lisinopril (ZESTRIL) 20 MG tablet Take 1 tablet (20 mg total) by mouth daily. 90 tablet 3  ? omeprazole (PRILOSEC) 40 MG capsule Take 40 mg by mouth  daily.    ? ?No facility-administered medications prior to visit.  ? ? ?Allergies  ?Allergen Reactions  ? Amoxicillin-Pot Clavulanate Rash  ? Doxycycline Other (See Comments)  ?  headache  ? Levaquin [Levofloxacin In D5w] Other (See Comments)  ?  tendonopathy  ? Clindamycin Itching  ? Clindamycin/Lincomycin Itching  ? Lactose Intolerance (Gi) Nausea Only  ? Metoprolol   ?  Weight gain/fluid retention   ? Penicillin G Other (See Comments)  ? Aspirin Other (See Comments)  ?  Migraine headache  ? Latex Rash  ? Penicillins Swelling and Rash  ?  Did it involve swelling of the face/tongue/throat, SOB, or low BP? No ?Did it involve sudden or severe rash/hives, skin peeling, or any reaction on the inside of your mouth or nose? Yes ?Did you need to seek medical attention at a  hospital or doctor's office? No ?When did it last happen?      61 yo ?If all above answers are ?NO?, may proceed with cephalosporin use.  ? ? ?Review of Systems ?As per HPI ?   ?Objective:  ?  ?Gen: NAD, resting comfortably ?Skin: Erythematous papules on right side of mid abdomen near midline and right side of back, near midline, no blistering, tender to palpation mildly, on the same dermatome, although interestingly no rash in between from the abdomen and the back ?Neuro: grossly normal, moves all extremities ?Psych: Normal affect and thought content ? ?BP 130/76 (BP Location: Left Arm, Patient Position: Sitting, Cuff Size: Normal)   Pulse 67   Temp (!) 97 ?F (36.1 ?C) (Temporal)   Resp (!) 95   Ht '5\' 7"'$  (1.702 m)   Wt 184 lb 6.4 oz (83.6 kg)   LMP 05/10/2014 (Approximate)   BMI 28.88 kg/m?  ?Wt Readings from Last 3 Encounters:  ?11/03/21 184 lb 6.4 oz (83.6 kg)  ?10/01/21 181 lb 2 oz (82.2 kg)  ?09/25/21 186 lb (84.4 kg)  ? ? ?Health Maintenance Due  ?Topic Date Due  ? HIV Screening  Never done  ? Hepatitis C Screening  Never done  ? TETANUS/TDAP  Never done  ? Zoster Vaccines- Shingrix (1 of 2) Never done  ? PAP SMEAR-Modifier  11/20/2016  ? COVID-19 Vaccine (4 - Booster for Moderna series) 08/16/2020  ? MAMMOGRAM  03/23/2021  ? ? ?There are no preventive care reminders to display for this patient. ? ? ?Lab Results  ?Component Value Date  ? TSH 1.510 07/02/2017  ? ?Lab Results  ?Component Value Date  ? WBC 10.0 01/12/2019  ? HGB 14.8 01/12/2019  ? HCT 44.3 01/12/2019  ? MCV 86.9 01/12/2019  ? PLT 207 01/12/2019  ? ?Lab Results  ?Component Value Date  ? NA 144 01/12/2019  ? K 3.5 01/12/2019  ? CO2 25 01/12/2019  ? GLUCOSE 94 01/12/2019  ? BUN 24 (H) 01/12/2019  ? CREATININE 0.74 01/12/2019  ? BILITOT 0.8 06/18/2016  ? ALKPHOS 74 06/18/2016  ? AST 17 06/18/2016  ? ALT 12 06/18/2016  ? PROT 6.7 06/18/2016  ? ALBUMIN 4.4 06/18/2016  ? CALCIUM 10.0 01/12/2019  ? ANIONGAP 12 01/12/2019  ? GFR 78.17 06/18/2016   ? ?Lab Results  ?Component Value Date  ? CHOL 199 06/18/2016  ? ?Lab Results  ?Component Value Date  ? HDL 61.00 06/18/2016  ? ?Lab Results  ?Component Value Date  ? [place] 99 06/18/2016  ? ?Lab Results  ?Component Value Date  ? TRIG 194.0 (H) 06/18/2016  ? ?Lab Results  ?Component Value  Date  ? CHOLHDL 3 06/18/2016  ? ?Lab Results  ?Component Value Date  ? HGBA1C 5.3 06/18/2016  ? ? ?   ?Assessment & Plan:  ? ?Rash ?Painful, concern for possible shingles given that there aligning up in the same dermatome and painful, although no blistering still within window for antiviral valacyclovir 1000 mg twice daily for 10 days return precautions discussed ?OTC ibuprofen as needed pain ? ?Problem List Items Addressed This Visit   ?None ?Visit Diagnoses   ? ? Rash    -  Primary  ? Relevant Medications  ? valACYclovir (VALTREX) 1000 MG tablet  ? ?  ? ? ? ?Meds ordered this encounter  ?Medications  ? valACYclovir (VALTREX) 1000 MG tablet  ?  Sig: Take 1 tablet (1,000 mg total) by mouth 2 (two) times daily for 10 days.  ?  Dispense:  20 tablet  ?  Refill:  0  ? ? ? ?Bonnita Hollow, MD ? ?

## 2021-11-04 ENCOUNTER — Ambulatory Visit (HOSPITAL_BASED_OUTPATIENT_CLINIC_OR_DEPARTMENT_OTHER): Payer: BC Managed Care – PPO | Admitting: Cardiovascular Disease

## 2021-11-11 ENCOUNTER — Ambulatory Visit (INDEPENDENT_AMBULATORY_CARE_PROVIDER_SITE_OTHER): Payer: BC Managed Care – PPO | Admitting: Professional

## 2021-11-11 ENCOUNTER — Encounter: Payer: Self-pay | Admitting: Professional

## 2021-11-11 DIAGNOSIS — F4310 Post-traumatic stress disorder, unspecified: Secondary | ICD-10-CM

## 2021-11-11 DIAGNOSIS — F411 Generalized anxiety disorder: Secondary | ICD-10-CM

## 2021-11-11 NOTE — Progress Notes (Signed)
[facility] Counselor/Therapist Progress Note ? ?Patient ID: Megan May, MRN: [id number],   ? ?Date: 11/11/2021 ? ?Time Spent: 43 minutes 4-443 pm ? ?Treatment Type: Individual Therapy ? ?Risk Assessment: ?Danger to Self:  No ?Self-injurious Behavior: No ?Danger to Others: No ? ?Subjective:  This session was held via video teletherapy (webex) due to the coronavirus risk at this time. The patient consented to video teletherapy and was located in her car during this session. She is aware it is the responsibility of the patient to secure confidentiality on her end of the session. The provider was in a private home office for the duration of this session.  ? ?The patient arrived on time for her webex appointment. ? ?Issues addressed: ?1- medical ?-has been diagnosed with shingles though uncertain ?2-personal ?-husband lost 6k income last week in his own business ?  -lost account that he had been in charge of for 14 years ?-her husband has his regular job ?-pt in [place] visiting mother for [place] (and her spring break) ?-spouse is 58 and has applied for social security rather than waiting until next year ?-one of pt's pensions will start in June ?-having to make decision about whether to buy now that they have cash income or wait ?-pt needs to make decisions about where she will work ?  -if she took one job it would not be in her area but would be significantly less stress ?  -pt can retire from current job in two years ?    -she is realizing the benefit in continuing to work will positively impact her SS ?-Associate Professor said they would not be able to find her a job ?  -was told she could still volunteer ?  -pt is frustrated ?  -she told the person it was fine for her to volunteer on the condition that they continue to find her a job ?  -Jodi Mourning has realized that she is not capable of living alone ?    -she questioned what will happen to her ?    -pt assured her that she  will be with her, then her aunts, and hopefully with her cousins ?3-anxiety ?-not open to medication ?-cannot see anyway out of her situation with daughter IDD status ?  -problem solved with pt ?  -pt struggling to see any way to be successful ?  -shared article with pt about two [place] lawmakers who are organizing around IDD issues ? ?Problems Addressed  ?Anxiety, Balancing Work and [employer], Low Self-Esteem/Lack of Assertiveness, Partner Relational Problems ? ?Goals ?1. Address and eliminate maladaptive thought processes that lead to anxious responses. ?2. Develop a realistic perspective regarding demands and obligations of multiple roles and their completion. ?3. Develop the necessary skills for effective, open communication and mutually satisfying intimacy. ?4. increased awareness of own role in relationship conflicts. ?Objective ?Increase the frequency and quality of communication with the partner. ?Target Date: 2022-06-24 Frequency: Biweekly  ?Progress: 0 Modality: individual  ?Related Interventions ?Help the client to explore and clarify her own values and priorities; encourage her to reduce the psychological importance placed on one or more roles to reduce the level of conflict. ?Help the client to develop a realistic schedule that outlines the responsibilities of her partner and family members; help the client enlist the commitment of all individuals to the schedule. ?Explore, along with the client, strategies for making her life more manageable and less stressful (e.g., waking up before the children to exercise  or have a cup of tea, pack the children's lunches and backpacks the night before). ?Obtain feedback on any difficulties the client experiences in application and provide tips for integrating methods into his/her everyday routine. ?Objective ?Each partner identifies her/his own role in the conflicts. ?Target Date: 2022-06-24 Frequency: Biweekly  ?Progress: 0 Modality: individual   ?Objective ?Identify the positive aspects of the relationship. ?Target Date: 2022-06-24 Frequency: Biweekly  ?Progress: 0 Modality: individual  ?Objective ?Utilize at least two new conflict-resolution techniques to resolve issues reasonably. ?Target Date: 2022-06-24 Frequency: Biweekly  ?Progress: 0 Modality: individual  ?5. Increase overall sense of well-being via reduction/elimination of anxiety. ?6. Maintain a balance between the multiple demands of motherhood, work, and other life roles and responsibilities. ?Objective ?Generate a list of self-care activities and make a commitment to regularly participate in such activities. ?Target Date: 2022-06-24 Frequency: Biweekly  ?Progress: 0 Modality: individual  ?Objective ?Implement a schedule and division of home responsibilities with partner and family members. ?Target Date: 2022-06-24 Frequency: Biweekly  ?Progress: 0 Modality: individual  ?Objective ?Protect each role from interfering with the other. ?Target Date: 2022-06-24 Frequency: Biweekly  ?Progress: 0 Modality: individual  ?Objective ?Clarify values and priorities. ?Target Date: 2022-06-24 Frequency: Biweekly  ?Progress: 0 Modality: individual  ?7. Reduce self-disparaging remarks and negative self-talk. ?Objective ?Identify three anxiety-related fears associated with being assertive. ?Target Date: 2022-06-24 Frequency: Biweekly  ?Progress: 0 Modality: individual  ?Objective ?Recall situations in which beliefs and feelings were asserted. ?Target Date: 2022-06-24 Frequency: Biweekly  ?Progress: 0 Modality: individual  ?Objective ?Practice saying "no" to at least one person, declining to comply with a request/favor or identify and assert rights, needs, and wants. ?Target Date: 2022-06-24 Frequency: Biweekly  ?Progress: 0 Modality: individual  ?Objective ?Verbalize an understanding of the differences between passive, assertive, and aggressive behavior. ?Target Date: 2022-06-24 Frequency: Biweekly  ?Progress: 0  Modality: individual  ?8. Resolve issues involving low self-esteem or low self-efficacy that contribute to anxiety. ?Objective ?Self-correct maladaptive thought patterns preemptively in order to lessen or eliminate anxiety at least 90% of the time; increase positive self-talk. ?Target Date: 2022-06-24 Frequency: Biweekly  ?Progress: 0 Modality: individual  ?Related Interventions ?Collaborate with the client to determine what kind of changes need to be made, internally and externally, for calmness and satisfaction to exist within the arenas of work, home, and school. ?Objective ?Acknowledge underlying irrational or illogical thought patterns that contribute to anxiety. ?Target Date: 2022-06-24 Frequency: Biweekly  ?Progress: 0 Modality: individual  ?Objective ?Learn and practice methods of reducing anxiety in a variety of situations. ?Target Date: 2022-06-24 Frequency: Biweekly  ?Progress: 0 Modality: individual  ?Objective ?Identify precipitating events, thoughts, feelings, and reactions that are believed to contribute to anxiety. ?Target Date: 2022-06-24 Frequency: Biweekly  ?Progress: 0 Modality: individual  ?Objective ?Verbalize anxiety symptoms as well as when, where, and how frequently they occur; describe attempts to resolve anxiety. ?Target Date: 2022-06-24 Frequency: Biweekly  ?Progress: 0 Modality: individual  ? ?Diagnosis:Generalized anxiety disorder ? ?PTSD (post-traumatic stress disorder) ? ?Plan:  ?-meet again on Tuesday, November 25, 2021 at 4pm ? ?Francie Massing, [facility] ? ?

## 2021-11-25 ENCOUNTER — Encounter: Payer: Self-pay | Admitting: Professional

## 2021-11-25 ENCOUNTER — Ambulatory Visit (INDEPENDENT_AMBULATORY_CARE_PROVIDER_SITE_OTHER): Payer: BC Managed Care – PPO | Admitting: Professional

## 2021-11-25 DIAGNOSIS — F4310 Post-traumatic stress disorder, unspecified: Secondary | ICD-10-CM | POA: Diagnosis not present

## 2021-11-25 DIAGNOSIS — F411 Generalized anxiety disorder: Secondary | ICD-10-CM

## 2021-11-25 NOTE — Progress Notes (Signed)
[place] Counselor/Therapist Progress Note ? ?Patient ID: Megan May, MRN: [id number],   ? ?Date: 11/25/2021 ? ?Time Spent: 47 minutes 4-447 pm ? ?Treatment Type: Individual Therapy ? ?Risk Assessment: ?Danger to Self:  No ?Self-injurious Behavior: No ?Danger to Others: No ? ?Subjective:  This session was held via video teletherapy (webex) due to the coronavirus risk at this time. The patient consented to video teletherapy and was located in her car during this session. She is aware it is the responsibility of the patient to secure confidentiality on her end of the session. The provider was in a private home office for the duration of this session.  ? ?The patient arrived on time for her webex appointment. ? ?Issues addressed: ?1- medical ?-has been diagnosed with shingles though uncertain ?2-personal ?-husband is going to get his social security and their daughter's social security ?-pt will get a small pension from [place] ?-if spouse continues with current employment they will be okay ?  -pt worried that he will lose his job ?-pt reports that ad things always happen to her ?  -loss of two babies have a baby she has CP ?  -loss of jobs and homes ?  -when she goes home to visit her mom she doesn't want to come back ?    -she cannot afford to do so because she cannot afford to lose the pension ?3-anxiety ?-pt's anxiety gets the vest of her and then impacts him ?-pt reports she has to stay even and has to function ?-exhausted and does not have time for herself ?4-professional ?-has no ability to get her work done ?-this is not unique to the pt but her specialty ?-pt is angry about the lack of coordination by leadership ?  -put all files in red folders ?  -no hole punch to get it completed ?  -has to then call and ask for folders ?-there are people that leave everyday at 5-6 and don't do things at home ?  -lesson plans are not targeted ?  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  -some people say that you don't need to take work home ?  -other friends claims they have taken work home ?  -pt has expressed interest in the intake position ?    -she was told there are many moving parts and she will get back with her ?  -she would work in an old rundown dilapidated building with a not so nice staff ?    -her and Lelan Pons would likely shared an office and that would be good ? ?Problems Addressed  ?Anxiety, Balancing Work and [employer], Low Self-Esteem/Lack of Assertiveness, Partner Relational Problems ? ?Goals ?1. Address and eliminate maladaptive thought processes that lead to anxious responses. ?2. Develop a realistic perspective regarding demands and obligations of multiple roles and their completion. ?3. Develop the necessary skills for effective, open communication and mutually satisfying intimacy. ?4. increased awareness of own role in relationship conflicts. ?Objective ?Increase the frequency and quality of communication with the partner. ?Target Date: 2022-06-24 Frequency: Biweekly  ?Progress: 30 Modality: individual  ?Related Interventions ?Help the client to explore and clarify her own  values and priorities; encourage her to reduce the psychological importance placed on one or more roles to reduce the level of conflict. ?Help the client to develop a realistic schedule that outlines the responsibilities of her partner and family members; help the client enlist the commitment of all individuals to the schedule. ?Explore, along with the client, strategies for making her life more manageable and less stressful (e.g., waking up before the children to exercise or have a cup  of tea, pack the children's lunches and backpacks the night before). ?Obtain feedback on any difficulties the client experiences in application and provide tips for integrating methods into his/her everyday routine. ?Objective ?Identifies her own role in the conflicts. ?Target Date: 2022-06-24 Frequency: Biweekly  ?Progress: 30 Modality: individual  ?Objective ?Identify the positive aspects of the relationship. ?Target Date: 2022-06-24 Frequency: Biweekly  ?Progress: 0 Modality: individual  ?Objective ?Utilize at least two new conflict-resolution techniques to resolve issues reasonably. ?Target Date: 2022-06-24 Frequency: Biweekly  ?Progress: 50 Modality: individual  ?5. Increase overall sense of well-being via reduction/elimination of anxiety. ?6. Maintain a balance between the multiple demands of motherhood, work, and other life roles and responsibilities. ?Objective ?Generate a list of self-care activities and make a commitment to regularly participate in such activities. ?Target Date: 2022-06-24 Frequency: Biweekly  ?Progress: 0 Modality: individual  ?Objective ?Implement a schedule and division of home responsibilities with partner and family members. ?Target Date: 2022-06-24 Frequency: Biweekly  ?Progress: 0 Modality: individual  ?Objective ?Protect each role from interfering with the other. ?Target Date: 2022-06-24 Frequency: Biweekly  ?Progress: 0 Modality: individual  ?Objective ?Clarify values and priorities. ?Target Date: 2022-06-24 Frequency: Biweekly  ?Progress: 0 Modality: individual  ?7. Reduce self-disparaging remarks and negative self-talk. ?Objective ?Identify three anxiety-related fears associated with being assertive. ?Target Date: 2022-06-24 Frequency: Biweekly  ?Progress: 0 Modality: individual  ?Objective ?Recall situations in which beliefs and feelings were asserted. ?Target Date: 2022-06-24 Frequency: Biweekly  ?Progress: 0 Modality: individual  ?Objective ?Practice saying "no" to at least  one person, declining to comply with a request/favor or identify and assert rights, needs, and wants. ?Target Date: 2022-06-24 Frequency: Biweekly  ?Progress: 0 Modality: individual  ?Objective ?Verbalize an understanding of the differences between passive, assertive, and aggressive behavior. ?Target Date: 2022-06-24 Frequency: Biweekly  ?Progress: 0 Modality: individual  ?8. Resolve issues involving low self-esteem or low self-efficacy that contribute to anxiety. ?Objective ?Self-correct maladaptive thought patterns preemptively in order to lessen or eliminate anxiety at least 90% of the time; increase positive self-talk. ?Target Date: 2022-06-24 Frequency: Biweekly  ?Progress: 0 Modality: individual  ?Related Interventions ?Collaborate with the client to determine what kind of changes need to be made, internally and externally, for calmness and satisfaction to exist within the arenas of work, home, and school. ?Objective ?Acknowledge underlying irrational or illogical thought patterns that contribute to anxiety. ?Target Date: 2022-06-24 Frequency: Biweekly  ?Progress: 0 Modality: individual  ?Objective ?Learn and practice methods of reducing anxiety in a variety of situations. ?Target Date: 2022-06-24 Frequency: Biweekly  ?Progress: 0 Modality: individual  ?Objective ?Identify precipitating events, thoughts, feelings, and reactions that are believed to contribute to anxiety. ?Target Date: 2022-06-24 Frequency: Biweekly  ?Progress: 0 Modality: individual  ?Objective ?Verbalize anxiety symptoms as well as when, where, and how frequently they occur; describe attempts to resolve anxiety. ?Target Date: 2022-06-24 Frequency: Biweekly  ?Progress: 0 Modality: individual  ? ?Diagnosis:Generalized anxiety disorder ? ?PTSD (post-traumatic stress disorder) ? ?Plan:  ?-pt unable to commit to time at this moment due to  work responsibilities and trying to get children in for speech therapy before the end of the school year. ?-pt  will call as she is able to schedule ? ?Francie Massing, [facility] ?

## 2021-11-27 NOTE — Progress Notes (Shared)
?[facility] Note ? ?12/11/2021 ? ?  ? ?CHIEF COMPLAINT ?Patient presents for No chief complaint on file. ? ? ?HISTORY OF PRESENT ILLNESS: ?Megan May is a 61 y.o. female who presents to the clinic today for:  ? ? ? ?Referring physician: ?Mosie Lukes, MD ?[address] ?[address] ?[place],  [place] [id number] ? ?HISTORICAL INFORMATION:  ? ?Selected notes from the [facility] ?Referred by Dr. Sandre Kitty for retinal edema and retinopathy ?LEE:  ?Ocular Hx- ?PMH- ?  ? ?CURRENT MEDICATIONS: ?No current outpatient medications on file. (Ophthalmic Drugs)  ? ?No current facility-administered medications for this visit. (Ophthalmic Drugs)  ? ?Current Outpatient Medications (Other)  ?Medication Sig  ? lisinopril (ZESTRIL) 20 MG tablet Take 1 tablet (20 mg total) by mouth daily.  ? omeprazole (PRILOSEC) 40 MG capsule Take 40 mg by mouth daily.  ? ?No current facility-administered medications for this visit. (Other)  ? ? ? ? ?REVIEW OF SYSTEMS: ? ? ? ?ALLERGIES ?Allergies  ?Allergen Reactions  ? Amoxicillin-Pot Clavulanate Rash  ? Doxycycline Other (See Comments)  ?  headache  ? Levaquin [Levofloxacin In D5w] Other (See Comments)  ?  tendonopathy  ? Clindamycin Itching  ? Clindamycin/Lincomycin Itching  ? Lactose Intolerance (Gi) Nausea Only  ? Metoprolol   ?  Weight gain/fluid retention   ? Penicillin G Other (See Comments)  ? Aspirin Other (See Comments)  ?  Migraine headache  ? Latex Rash  ? Penicillins Swelling and Rash  ?  Did it involve swelling of the face/tongue/throat, SOB, or low BP? No ?Did it involve sudden or severe rash/hives, skin peeling, or any reaction on the inside of your mouth or nose? Yes ?Did you need to seek medical attention at a hospital or doctor's office? No ?When did it last happen?      61 yo ?If all above answers are ?NO?, may proceed with cephalosporin use.  ? ? ?PAST MEDICAL HISTORY ?Past Medical History:  ?Diagnosis Date  ? Anxiety   ?  Asthma   ? Depression   ? history of   ? Eosinophilic esophagitis   ? Fatigue 07/02/2017  ? Generalized anxiety disorder   ? Hyperglycemia 03/15/2016  ? Hyperlipidemia, mixed 06/30/2016  ? Hypersomnia, recurrent   ? Hypertension   ? Migraine   ? Migraines   ? Posttraumatic stress disorder   ? ?Past Surgical History:  ?Procedure Laterality Date  ? CESAREAN SECTION  2004  ? ESOPHAGOGASTRODUODENOSCOPY (EGD) WITH PROPOFOL N/A 01/12/2019  ? Procedure: ESOPHAGOGASTRODUODENOSCOPY (EGD) WITH PROPOFOL;  Surgeon: Carol Ada, MD;  Location: [facility];  Service: Endoscopy;  Laterality: N/A;  ? FRACTURE SURGERY  1973  ? R arm  ? LAPAROTOMY Bilateral 06/14/2014  ? Procedure: LAPAROTOMY with right  salpingo-oophorectomy and left salpingectomy;  Surgeon: Linda Hedges, DO;  Location: [facility];  Service: Gynecology;  Laterality: Bilateral;  ? [place]  ? ? ?FAMILY HISTORY ?Family History  ?Problem Relation Age of Onset  ? Hypertension Father   ? Heart disease Father   ? Diabetes Father   ? CAD Father   ? Stroke Maternal Grandfather   ? Stroke Maternal Grandmother   ? Diabetes Paternal Grandmother   ? Other Cousin   ?     bicuspid aortic valve  ? Diabetes Brother   ? ? ?SOCIAL HISTORY ?Social History  ? ?Tobacco Use  ? Smoking status: Never  ? Smokeless tobacco: Never  ?  Substance Use Topics  ? Alcohol use: No  ? Drug use: No  ? ?  ? ?  ? ?OPHTHALMIC EXAM: ? ?Not recorded ?  ? ? ?IMAGING AND PROCEDURES  ?Imaging and Procedures for 12/11/2021 ? ? ? ?  ?  ? ?  ?ASSESSMENT/PLAN: ? ?  ICD-10-CM   ?1. 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  ? ? ?1. ? ?2. ? ?3. ? ?Ophthalmic Meds Ordered this visit:  ?No orders of the defined types were placed in this encounter. ? ? ?  ? ?No follow-ups on file. ? ?There are no Patient Instructions on file for this visit. ? ? ?Explained the diagnoses, plan, and follow up with the patient and they expressed understanding.  Patient expressed understanding of the importance of proper follow up  care.  ? ?This document serves as a record of services personally performed by Gardiner Sleeper, MD, PhD. It was created on their behalf by San Jetty. Owens Shark, OA an ophthalmic technician. The creation of this record is the provider's dictation and/or activities during the visit.   ? ?Electronically signed by: San Jetty. Owens Shark, [place] 04.27.2023 12:36 PM ? ? ?Gardiner Sleeper, M.D., Ph.D. ?Diseases & Surgery of the Retina and Vitreous ?[place] ? ? ? ? ? ?Abbreviations: ?M myopia (nearsighted); A astigmatism; H hyperopia (farsighted); P presbyopia; Mrx spectacle prescription;  CTL contact lenses; OD right eye; OS left eye; OU both eyes  XT exotropia; ET esotropia; PEK punctate epithelial keratitis; PEE punctate epithelial erosions; DES dry eye syndrome; MGD meibomian gland dysfunction; ATs artificial tears; PFAT's preservative free artificial tears; [place] nuclear sclerotic cataract; PSC posterior subcapsular cataract; ERM epi-retinal membrane; PVD posterior vitreous detachment; RD retinal detachment; DM diabetes mellitus; DR diabetic retinopathy; NPDR non-proliferative diabetic retinopathy; PDR proliferative diabetic retinopathy; CSME clinically significant macular edema; DME diabetic macular edema; dbh dot blot hemorrhages; CWS cotton wool spot; POAG primary open angle glaucoma; C/D cup-to-disc ratio; HVF humphrey visual field; GVF goldmann visual field; OCT optical coherence tomography; IOP intraocular pressure; BRVO Branch retinal vein occlusion; CRVO central retinal vein occlusion; CRAO central retinal artery occlusion; BRAO branch retinal artery occlusion; RT retinal tear; SB scleral buckle; PPV pars plana vitrectomy; VH Vitreous hemorrhage; PRP panretinal laser photocoagulation; IVK intravitreal kenalog; VMT vitreomacular traction; MH Macular hole;  NVD neovascularization of the disc; NVE neovascularization elsewhere; AREDS age related eye disease study; ARMD age related macular degeneration;  POAG primary open angle glaucoma; EBMD epithelial/anterior basement membrane dystrophy; ACIOL anterior chamber intraocular lens; IOL intraocular lens; PCIOL posterior chamber intraocular lens; Phaco/IOL phacoemulsification with intraocular lens placement; [place] photorefractive keratectomy; LASIK laser assisted in situ keratomileusis; HTN hypertension; DM diabetes mellitus; COPD chronic obstructive pulmonary disease ? ?

## 2021-12-11 ENCOUNTER — Encounter (INDEPENDENT_AMBULATORY_CARE_PROVIDER_SITE_OTHER): Payer: BC Managed Care – PPO | Admitting: Ophthalmology

## 2021-12-11 DIAGNOSIS — H3581 Retinal edema: Secondary | ICD-10-CM

## 2022-01-21 ENCOUNTER — Encounter (HOSPITAL_BASED_OUTPATIENT_CLINIC_OR_DEPARTMENT_OTHER): Payer: Self-pay | Admitting: Cardiovascular Disease

## 2022-01-26 ENCOUNTER — Other Ambulatory Visit: Payer: Self-pay | Admitting: Family Medicine

## 2022-01-26 DIAGNOSIS — Z1231 Encounter for screening mammogram for malignant neoplasm of breast: Secondary | ICD-10-CM

## 2022-02-05 ENCOUNTER — Ambulatory Visit
Admission: RE | Admit: 2022-02-05 | Discharge: 2022-02-05 | Disposition: A | Discharge status: Home / Self Care | Payer: BC Managed Care – PPO | Source: Ambulatory Visit | Attending: Family Medicine | Admitting: Family Medicine

## 2022-02-05 DIAGNOSIS — Z1231 Encounter for screening mammogram for malignant neoplasm of breast: Secondary | ICD-10-CM

## 2022-02-09 ENCOUNTER — Other Ambulatory Visit: Payer: Self-pay | Admitting: Gastroenterology

## 2022-02-09 DIAGNOSIS — R1310 Dysphagia, unspecified: Secondary | ICD-10-CM

## 2022-02-10 ENCOUNTER — Ambulatory Visit (INDEPENDENT_AMBULATORY_CARE_PROVIDER_SITE_OTHER): Payer: BC Managed Care – PPO | Admitting: Family Medicine

## 2022-02-10 ENCOUNTER — Encounter: Payer: Self-pay | Admitting: Family Medicine

## 2022-02-10 VITALS — BP 136/60 | HR 72 | Ht 67.0 in | Wt 193.2 lb

## 2022-02-10 DIAGNOSIS — E559 Vitamin D deficiency, unspecified: Secondary | ICD-10-CM

## 2022-02-10 DIAGNOSIS — E611 Iron deficiency: Secondary | ICD-10-CM

## 2022-02-10 DIAGNOSIS — Z0000 Encounter for general adult medical examination without abnormal findings: Secondary | ICD-10-CM | POA: Diagnosis not present

## 2022-02-10 DIAGNOSIS — Z23 Encounter for immunization: Secondary | ICD-10-CM

## 2022-02-10 NOTE — Progress Notes (Signed)
Complete physical exam  Patient: Megan May   DOB: [birth year]   61 y.o. Female  MRN: [id number]  Subjective:    CC: CPE   Megan May is a 61 y.o. female who presents today for a complete physical exam. She reports consuming a  vegetarian, DASH  diet. Home exercise routine includes weight lifting, walking. She generally feels well. She reports sleeping well. She does not have additional problems to discuss today.   She is currently taking lisinopril for HTN and omeprazole for eosinophilic esophagitis. Tolerating well. No concerns today.      Most recent fall risk assessment:    02/10/2022    1:23 PM  Fall Risk   Falls in the past year? 1  Number falls in past yr: 0  Injury with Fall? 1  Risk for fall due to : Other (Comment)  Risk for fall due to: Comment fell due to foot fracture  Follow up Falls evaluation completed     Most recent depression screenings:    02/10/2022    1:23 PM 11/03/2021    4:16 PM  PHQ 2/9 Scores  PHQ - 2 Score 0 0    Vision:Not within last year , Dental: No current dental problems and Receives regular dental care, and STD: no concerns    Patient Care Team: Mosie Lukes, MD as PCP - General (Family Medicine) Skeet Latch, MD as Attending Physician (Cardiology)   Outpatient Medications Prior to Visit  Medication Sig   lisinopril (ZESTRIL) 20 MG tablet Take 1 tablet (20 mg total) by mouth daily.   omeprazole (PRILOSEC) 40 MG capsule Take 40 mg by mouth daily.   No facility-administered medications prior to visit.    ROS All review of systems negative except what is listed in the HPI        Objective:     BP 136/60   Pulse 72   Ht '5\' 7"'$  (1.702 m)   Wt 193 lb 3.2 oz (87.6 kg)   LMP 05/10/2014 (Approximate)   BMI 30.26 kg/m    Physical Exam Vitals reviewed.  Constitutional:      General: She is not in acute distress.    Appearance: Normal appearance. She is not ill-appearing.  HENT:      Head: Normocephalic and atraumatic.     Right Ear: Tympanic membrane normal.     Left Ear: Tympanic membrane normal.     Nose: Nose normal.     Mouth/Throat:     Mouth: Mucous membranes are moist.     Pharynx: Oropharynx is clear.  Eyes:     Extraocular Movements: Extraocular movements intact.     Conjunctiva/sclera: Conjunctivae normal.     Pupils: Pupils are equal, round, and reactive to light.  Neck:     Vascular: No carotid bruit.  Cardiovascular:     Rate and Rhythm: Normal rate and regular rhythm.     Pulses: Normal pulses.  Pulmonary:     Effort: Pulmonary effort is normal.     Breath sounds: Normal breath sounds.  Abdominal:     General: Abdomen is flat. Bowel sounds are normal. There is no distension.     Palpations: Abdomen is soft. There is no mass.     Tenderness: There is no abdominal tenderness. There is no right CVA tenderness, left CVA tenderness, guarding or rebound.  Genitourinary:    Comments: deferred Musculoskeletal:        General: Normal range of motion.  Cervical back: Normal range of motion and neck supple. No tenderness.     Right lower leg: No edema.     Left lower leg: No edema.  Lymphadenopathy:     Cervical: No cervical adenopathy.  Skin:    General: Skin is warm and dry.     Capillary Refill: Capillary refill takes less than 2 seconds.  Neurological:     General: No focal deficit present.     Mental Status: She is alert and oriented to person, place, and time. Mental status is at baseline.     Motor: No weakness.     Coordination: Coordination normal.     Gait: Gait normal.  Psychiatric:        Mood and Affect: Mood normal.        Behavior: Behavior normal.        Thought Content: Thought content normal.        Judgment: Judgment normal.      Results for orders placed or performed in visit on 02/10/22  HM PAP SMEAR  Result Value Ref Range   HM Pap smear normal   HM COLONOSCOPY  Result Value Ref Range   HM Colonoscopy See Report  (in chart) See Report (in chart), Patient Reported       Assessment & Plan:    Routine Health Maintenance and Physical Exam  Immunization History  Administered Date(s) Administered   Influenza, Seasonal, Injecte, Preservative Fre 05/10/2015   Influenza,inj,Quad PF,6+ Mos 05/02/2014, 05/30/2017, 05/03/2018   Influenza-Unspecified 05/04/2016   Moderna Sars-Covid-2 Vaccination 09/02/2019, 10/03/2019, 06/21/2020    Health Maintenance  Topic Date Due   HIV Screening  Never done   Hepatitis C Screening  Never done   TETANUS/TDAP  Never done   Zoster Vaccines- Shingrix (1 of 2) Never done   COVID-19 Vaccine (4 - Moderna series) 08/16/2020   PAP SMEAR-Modifier  11/20/2021   INFLUENZA VACCINE  03/03/2022   MAMMOGRAM  02/06/2024   COLONOSCOPY (Pts 45-48yr Insurance coverage will need to be confirmed)  02/06/2032   HPV VACCINES  Aged Out    Discussed health benefits of physical activity, and encouraged her to engage in regular exercise appropriate for her age and condition.  Problem List Items Addressed This Visit   None Visit Diagnoses     Annual physical exam    -  Primary   Relevant Orders   CBC   Comprehensive metabolic panel   Lipid panel   VITAMIN D 25 Hydroxy (Vit-D Deficiency, Fractures)   IBC panel   Vitamin D deficiency       Relevant Orders   VITAMIN D 25 Hydroxy (Vit-D Deficiency, Fractures)   Iron deficiency       Relevant Orders   IBC panel       LABORATORY TESTING:  - Health maintenance labs ordered today as discussed above.           CBC, CMP, LIPIDS - Hx of Vit D and iron deficiency - updating labs today  - STI testing: deferred - Pap smear:  Scheduled for August 2023 with GYN - HIV screening: refused - Hep C screening: refused    IMMUNIZATIONS:   - Tdap: Tetanus vaccination status reviewed: done today  - Influenza: n/a - Pneumonia vaccines: n/a - HPV: n/a - Shingrix vaccine: refused    SCREENING: - Mammogram: normal this year  - Bone  Density: n/a - Colonoscopy: Scheduled for August 2023 Discussed with patient purpose of the colonoscopy is to detect colon cancer  at curable precancerous or early stages  - AAA Screening: n/a - Lung Cancer Screening: n/a  Reports she just had TSH and glucose at endocrinology and they were normal.     Return in about 6 months (around 08/13/2022) for routine f/u PCP.     Terrilyn Saver, NP

## 2022-02-10 NOTE — Addendum Note (Signed)
Addended by: Beatris Ship L on: 02/10/2022 02:02 PM   Modules accepted: Orders

## 2022-02-11 ENCOUNTER — Ambulatory Visit
Admission: RE | Admit: 2022-02-11 | Discharge: 2022-02-11 | Disposition: A | Discharge status: Home / Self Care | Payer: BC Managed Care – PPO | Source: Ambulatory Visit | Attending: Gastroenterology | Admitting: Gastroenterology

## 2022-02-11 DIAGNOSIS — R1310 Dysphagia, unspecified: Secondary | ICD-10-CM

## 2022-02-11 LAB — IBC PANEL
Iron: 83 ug/dL (ref 42–145)
Saturation Ratios: 21.6 % (ref 20.0–50.0)
TIBC: 385 ug/dL (ref 250.0–450.0)
Transferrin: 275 mg/dL (ref 212.0–360.0)

## 2022-02-11 LAB — LIPID PANEL
Cholesterol: 210 mg/dL — ABNORMAL HIGH (ref 0–200)
HDL: 63.2 mg/dL (ref >39.00)
LDL Cholesterol: 121 mg/dL — ABNORMAL HIGH (ref 0–99)
NonHDL: 147.15
Total CHOL/HDL Ratio: 3
Triglycerides: 132 mg/dL (ref 0.0–149.0)
VLDL: 26.4 mg/dL (ref 0.0–40.0)

## 2022-02-11 LAB — COMPREHENSIVE METABOLIC PANEL
ALT: 14 U/L (ref 0–35)
AST: 19 U/L (ref 0–37)
Albumin: 4.5 g/dL (ref 3.5–5.2)
Alkaline Phosphatase: 83 U/L (ref 39–117)
BUN: 20 mg/dL (ref 6–23)
CO2: 26 mEq/L (ref 19–32)
Calcium: 9.7 mg/dL (ref 8.4–10.5)
Chloride: 106 mEq/L (ref 96–112)
Creatinine, Ser: 0.9 mg/dL (ref 0.40–1.20)
GFR: 69.66 mL/min (ref >60.00)
Glucose, Bld: 101 mg/dL — ABNORMAL HIGH (ref 70–99)
Potassium: 3.9 mEq/L (ref 3.5–5.1)
Sodium: 140 mEq/L (ref 135–145)
Total Bilirubin: 0.8 mg/dL (ref 0.2–1.2)
Total Protein: 6.7 g/dL (ref 6.0–8.3)

## 2022-02-11 LAB — CBC
HCT: 39.9 % (ref 36.0–46.0)
Hemoglobin: 13.5 g/dL (ref 12.0–15.0)
MCHC: 33.8 g/dL (ref 30.0–36.0)
MCV: 87 fl (ref 78.0–100.0)
Platelets: 220 10*3/uL (ref 150.0–400.0)
RBC: 4.58 Mil/uL (ref 3.87–5.11)
RDW: 13.4 % (ref 11.5–15.5)
WBC: 6.2 10*3/uL (ref 4.0–10.5)

## 2022-02-11 LAB — VITAMIN D 25 HYDROXY (VIT D DEFICIENCY, FRACTURES): VITD: 20.68 ng/mL — ABNORMAL LOW (ref 30.00–100.00)

## 2022-02-11 NOTE — Progress Notes (Signed)
Cholesterol is slightly high. Recommend lifestyle measures below. Lifestyle factors for lowering cholesterol include: Diet therapy - heart-healthy diet rich in fruits, veggies, fiber-rich whole grains, lean meats, chicken, fish (at least twice a week), fat-free or 1% dairy products; foods low in saturated/trans fats, cholesterol, sodium, and sugar. Mediterranean diet has shown to be very heart healthy. Regular exercise - recommend at least 30 minutes a day, 5 times per week Weight management   Vitamin D is low. Recommend starting with over-the-counter supplement of 1,000 units of Vitamin D daily. Recheck levels in about 3 months.      The 10-year ASCVD risk score (Arnett DK, et al., 2019) is: 4.7%   Values used to calculate the score:     Age: 61 years     Sex: Female     Is Non-Hispanic African American: No     Diabetic: No     Tobacco smoker: No     Systolic Blood Pressure: 244 mmHg     Is BP treated: Yes     HDL Cholesterol: 63.2 mg/dL     Total Cholesterol: 210 mg/dL

## 2022-02-12 ENCOUNTER — Encounter: Payer: Self-pay | Admitting: Family Medicine

## 2022-02-17 ENCOUNTER — Encounter: Payer: BC Managed Care – PPO | Admitting: Family Medicine

## 2022-03-11 ENCOUNTER — Encounter (INDEPENDENT_AMBULATORY_CARE_PROVIDER_SITE_OTHER): Payer: Self-pay

## 2022-03-18 LAB — HM COLONOSCOPY

## 2022-03-23 ENCOUNTER — Other Ambulatory Visit: Payer: Self-pay | Admitting: Ophthalmology

## 2022-03-23 DIAGNOSIS — H05223 Edema of bilateral orbit: Secondary | ICD-10-CM

## 2022-03-25 NOTE — Telephone Encounter (Signed)
Overdue follow-up has been scheduled for 04/08/2022.  Loel Dubonnet, NP

## 2022-04-01 ENCOUNTER — Ambulatory Visit
Admission: RE | Admit: 2022-04-01 | Discharge: 2022-04-01 | Disposition: A | Discharge status: Home / Self Care | Payer: BC Managed Care – PPO | Source: Ambulatory Visit | Attending: Ophthalmology | Admitting: Ophthalmology

## 2022-04-01 DIAGNOSIS — H05223 Edema of bilateral orbit: Secondary | ICD-10-CM

## 2022-04-07 ENCOUNTER — Encounter (HOSPITAL_BASED_OUTPATIENT_CLINIC_OR_DEPARTMENT_OTHER): Payer: Self-pay | Admitting: Cardiovascular Disease

## 2022-04-07 ENCOUNTER — Ambulatory Visit (HOSPITAL_BASED_OUTPATIENT_CLINIC_OR_DEPARTMENT_OTHER): Payer: BC Managed Care – PPO | Admitting: Cardiovascular Disease

## 2022-04-07 VITALS — BP 142/94 | HR 79 | Ht 67.0 in | Wt 193.0 lb

## 2022-04-07 DIAGNOSIS — Z5181 Encounter for therapeutic drug level monitoring: Secondary | ICD-10-CM

## 2022-04-07 DIAGNOSIS — I10 Essential (primary) hypertension: Secondary | ICD-10-CM | POA: Diagnosis not present

## 2022-04-07 DIAGNOSIS — E7800 Pure hypercholesterolemia, unspecified: Secondary | ICD-10-CM | POA: Diagnosis not present

## 2022-04-07 MED ORDER — SPIRONOLACTONE 25 MG PO TABS: 25.0000 mg | ORAL_TABLET | Freq: Every day | ORAL | 3 refills | Status: DC

## 2022-04-07 NOTE — Patient Instructions (Signed)
Medication Instructions:  START SPIRONOLACTONE 25 MG DAILY   *If you need a refill on your cardiac medications before your next appointment, please call your pharmacy*  Lab Work: BMET IN 1 WEEK  If you have labs (blood work) drawn today and your tests are completely normal, you will receive your results only by: [place] (if you have MyChart) OR A paper copy in the mail If you have any lab test that is abnormal or we need to change your treatment, we will call you to review the results.  Testing/Procedures: CALCIUM SCORE - THIS WILL COST YOU $99 OUT OF POCKET   Follow-Up: At [facility], you and your health needs are our priority.  As part of our continuing mission to provide you with exceptional heart care, we have created designated Provider Care Teams.  These Care Teams include your primary Cardiologist (physician) and Advanced Practice Providers (APPs -  Physician Assistants and Nurse Practitioners) who all work together to provide you with the care you need, when you need it.  We recommend signing up for the patient portal called "MyChart".  Sign up information is provided on this After Visit Summary.  MyChart is used to connect with patients for Virtual Visits (Telemedicine).  Patients are able to view lab/test results, encounter notes, upcoming appointments, etc.  Non-urgent messages can be sent to your provider as well.   To learn more about what you can do with MyChart, go to [email or web address].    Your next appointment:   1 month(s)  The format for your next appointment:   In Person  Provider:   Laurann Montana, NP

## 2022-04-07 NOTE — Progress Notes (Signed)
Cardiology Office Note  Date:  04/07/2022   ID:  Megan May, DOB 12-30-1960, MRN [id number]  PCP:  Mosie Lukes, MD  Cardiologist:   Skeet Latch, MD   No chief complaint on file.    History of Present Illness: Megan May is a 61 y.o. female with hypertension, eosinophilic esophagitis, depression and anxiety who presents for follow up.   She was first seen on 02/2016 for chest discomfort.  She was referred for ETT.  However when she got there her blood pressure was too high so it was cancelled.  She was started on lisinopril 20 mg daily.  She had dizziness but this seems to have improved.  She stopped taking HCTZ-triamterene but then noted that increased lower extremity edema.  She followed up with Megan May, PharmD, and was started on HCTZ.  She had and echo 03/09/16 that revealed LVEF 65-70% and was otherwise unremarkable.  She underwent ETT 05/2016 that was negative for ischemia.  She achieved 11.7 METs on a Bruce protocol.    Megan May was struggling with fatigue from working 12-hour shifts. She reported occasional chest pain usually at rest. She had one near-syncopal episode after standing. At the time she was no taking lisinopril as instructed. Her chest pain was atypical and unchanged, so she did not have a repeat ischemic evaluation. She was seen by Megan Ransom, PA-C in 2020 and reported spikes in blood pressure under stressful situations. She saw Megan Domino, NP in 2021 and was well. At her last appointment her blood pressure was elevated but have been generally controlled at home so she wanted to work on diet and exercise.  The patient reports that her blood pressure has ranged from 366 to 440 systolic and 77 to 90 diastolic. She attempted the DASH diet but was unable to maintain it. The patient is currently experiencing menopause and is on [place], which she believes is contributing to weight gain. She had an episode of blood pressure dropping a  few weeks ago after a procedure, leading to a syncopal event while playing kickball with her daughter. The patient denies any chest pain or pressure during this event and takes her blood pressure medication regularly.  The patient exercises daily, performing floor exercises and arm weights, but has inconsistent walking habits. She reports feeling fine during exercise. She has experienced swelling in her legs and feet throughout the summer and has used furosemide occasionally to help with water weight. Kidney function has been normal.  The patient has been pescatarian for 30 years and does not use much salt. She is considering switching from lisinopril to a lisinopril/hydrochlorothiazide combination to help with water weight and blood pressure control. She has not had her A1C checked recently, and her glucose and cholesterol levels were slightly elevated during her last check. The patient also has an elevated IgG4 immunoglobulin level, which may be related to an autoimmune disease. She has eosinophilic esophagitis and has been experiencing eye issues. A CT scan was performed, but the results are pending.  Megan May reports that her blood pressure has ranged from 347 to 425 systolic and 77 to 90 diastolic. She attempted the DASH diet but was unable to maintain it. The patient is currently experiencing menopause and is on [place], which she believes is contributing to weight gain. She had an episode of blood pressure dropping a few weeks ago after a procedure.  She was playing kickball with her daughter and started running.  Everything turned white and  before she knew it shew as on the ground. There was no preceding CP or SOB.  She had no palpitations.  The patient denies any chest pain or pressure during this event and takes her blood pressure medication regularly.  The patient exercises daily, performing floor exercises and arm weights, but has inconsistent walking habits. She reports feeling fine  during exercise. She has experienced swelling in her legs and feet throughout the summer and has used furosemide occasionally to help with water weight. Kidney function has been normal.  The patient has been pescatarian for 30 years and does not use much salt.   Past Medical History:  Diagnosis Date   Anxiety    Asthma    Depression    history of    Eosinophilic esophagitis    Fatigue 07/02/2017   Generalized anxiety disorder    Hyperglycemia 03/15/2016   Hyperlipidemia, mixed 06/30/2016   Hypersomnia, recurrent    Hypertension    Migraine    Migraines    Posttraumatic stress disorder    Shingles 11/03/2021    Past Surgical History:  Procedure Laterality Date   CESAREAN SECTION  2004   ESOPHAGOGASTRODUODENOSCOPY (EGD) WITH PROPOFOL N/A 01/12/2019   Procedure: ESOPHAGOGASTRODUODENOSCOPY (EGD) WITH PROPOFOL;  Surgeon: Carol Ada, MD;  Location: [facility];  Service: Endoscopy;  Laterality: N/A;   FRACTURE SURGERY  1973   R arm   LAPAROTOMY Bilateral 06/14/2014   Procedure: LAPAROTOMY with right  salpingo-oophorectomy and left salpingectomy;  Surgeon: Linda Hedges, DO;  Location: [facility];  Service: Gynecology;  Laterality: Bilateral;   TONSILLECTOMY AND ADENOIDECTOMY  1969     Current Outpatient Medications  Medication Sig Dispense Refill   DUPIXENT 300 MG/2ML SOPN Inject into the skin.     lisinopril (ZESTRIL) 20 MG tablet Take 1 tablet (20 mg total) by mouth daily. 90 tablet 3   omeprazole (PRILOSEC) 40 MG capsule Take 40 mg by mouth daily.     spironolactone (ALDACTONE) 25 MG tablet Take 1 tablet (25 mg total) by mouth daily. 90 tablet 3   No current facility-administered medications for this visit.    Allergies:   Amoxicillin-pot clavulanate, Doxycycline, Levaquin [levofloxacin in d5w], Clindamycin, Clindamycin/lincomycin, Lactose intolerance (gi), Metoprolol, Penicillin g, Aspirin, Latex, and Penicillins    Social History:  The patient  reports that she has never  smoked. She has never used smokeless tobacco. She reports that she does not drink alcohol and does not use drugs.   Family History:  The patient's family history includes CAD in her father; Diabetes in her brother, father, and paternal grandmother; Heart disease in her father; Hypertension in her father; Other in her cousin; Stroke in her maternal grandfather and maternal grandmother.    ROS:   Please see the history of present illness. (+) Fracture of 5th metatarsal in left foot All other systems are reviewed and negative.    PHYSICAL EXAM: VS:  BP (!) 142/94   Pulse 79   Ht '5\' 7"'$  (1.702 m)   Wt 193 lb (87.5 kg)   LMP 05/10/2014 (Approximate)   BMI 30.23 kg/m  , BMI Body mass index is 30.23 kg/m. GENERAL:  Well appearing HEENT: Pupils equal round and reactive, fundi not visualized, oral mucosa unremarkable NECK:  No jugular venous distention, waveform within normal limits, carotid upstroke brisk and symmetric, no bruits, no thyromegaly LUNGS:  Clear to auscultation bilaterally HEART:  RRR.  PMI not displaced or sustained,S1 and S2 within normal limits, no S3, no S4, no clicks,  no rubs, no murmurs ABD:  Flat, positive bowel sounds normal in frequency in pitch, no bruits, no rebound, no guarding, no midline pulsatile mass, no hepatomegaly, no splenomegaly EXT:  2 plus pulses throughout, no edema, no cyanosis no clubbing SKIN:  No rashes no nodules NEURO:  Cranial nerves II through XII grossly intact, motor grossly intact throughout PSYCH:  Cognitively intact, oriented to person place and time  EKG:   07/10/2021: Sinus bradycardia. Rate 59 bpm. 07/02/17: Sinus rhythm.  Rate 61 bpm.  Non-specific T wave abnormalities.   02/27/16: sinus bradycardia. Rate 59 bpm.  ETT 05/26/16: Blood pressure demonstrated a hypertensive response to exercise. There was no ST segment deviation noted during stress.   ETT with good exercise tolerance (10:00); no chest pain; hypertensive BP response; no  diagnostic ST changes; negative adequate ETT; Duke treadmill score 10.  Echo 03/09/16: Study Conclusions - Left ventricle: The cavity size was normal. Systolic function was   vigorous. The estimated ejection fraction was in the range of 65%   to 70%. Wall motion was normal; there were no regional wall   motion abnormalities. Left ventricular diastolic function   parameters were normal. - Aortic valve: Transvalvular velocity was within the normal range.   There was no stenosis. There was no regurgitation. - Mitral valve: Transvalvular velocity was within the normal range.   There was no evidence for stenosis. There was trivial   regurgitation. - Right ventricle: The cavity size was normal. Wall thickness was   normal. Systolic function was normal. - Atrial septum: No defect or patent foramen ovale was identified   by color flow Doppler. - Tricuspid valve: There was trivial regurgitation. - Pulmonary arteries: Systolic pressure was within the normal   range. PA peak pressure: 18 mm Hg (S).  Carotid u/s 05/02/13: IMPRESSION:   1.  Trace noncalcified atherosclerotic plaque in the left carotid bifurcation resulting in less than 50% diameter stenosis. 2.  Unremarkable right carotid artery. 3.  Bilateral vertebral arteries are patent with normal antegrade flow.   Recent Labs: 02/10/2022: ALT 14; BUN 20; Creatinine, Ser 0.90; Hemoglobin 13.5; Platelets 220.0; Potassium 3.9; Sodium 140    Lipid Panel    Component Value Date/Time   CHOL 210 (H) 02/10/2022 1346   TRIG 132.0 02/10/2022 1346   HDL 63.20 02/10/2022 1346   CHOLHDL 3 02/10/2022 1346   VLDL 26.4 02/10/2022 1346   LDLCALC 121 (H) 02/10/2022 1346      Wt Readings from Last 3 Encounters:  04/07/22 193 lb (87.5 kg)  02/10/22 193 lb 3.2 oz (87.6 kg)  11/03/21 184 lb 6.4 oz (83.6 kg)      ASSESSMENT AND PLAN:  No problem-specific Assessment & Plan notes found for this encounter.  # Hypertension: # LE Edema:    -  Patient reports blood pressure ranging from 269 to 485 systolic and 77 to 90 diastolic.    - Currently on lisinopril, considering switching to lisinopril/hydrochlorothiazide combination.    - Plan: Continue monitoring blood pressure and maintain a goal of consistently under 130/80. Continue lisinopril and add spironolactone '25mg'$  daily.  Check BMP ina week.  # Obesity:    - Patient reports difficulty adhering to the DASH diet and is on Dupixent, which may contribute to weight gain.    - Plan: Encourage patient to continue with daily exercise, including floor exercises and arm weights. Recommend incorporating consistent walking and cardio into the exercise routine.  She is advised not to take Ozempic or Wegovy by  her gastroenterologist.  # Fall vs syncope: Unclear what happened when she was exercising. She has no other ischemic symptoms or arrhythmias.  We will get a coronary calcium score.  This will also help guide her lipid management as her ASCVD 10-year risk is 5.1% and therefore does not qualify for lipid-lowering therapy.  Her blood pressures at home to see if hyper- or hypotension may have been contributing.  Current medicines are reviewed at length with the patient today.  The patient does not have concerns regarding medicines.  The following changes have been made: add spironolactone  Labs/ tests ordered today include:   Orders Placed This Encounter  Procedures   CT CARDIAC SCORING (SELF PAY ONLY)   Basic metabolic panel    Disposition:   FU with Hazeline Charnley C. Oval Linsey, MD, [facility] in 1 month.    Signed, Tarnisha Kachmar C. Oval Linsey, MD, [facility]  04/07/2022 6:14 PM    [facility]

## 2022-04-09 ENCOUNTER — Ambulatory Visit (HOSPITAL_BASED_OUTPATIENT_CLINIC_OR_DEPARTMENT_OTHER)
Admission: RE | Admit: 2022-04-09 | Discharge: 2022-04-09 | Disposition: A | Discharge status: Home / Self Care | Payer: BC Managed Care – PPO | Source: Ambulatory Visit | Attending: Cardiovascular Disease | Admitting: Cardiovascular Disease

## 2022-04-09 DIAGNOSIS — E7800 Pure hypercholesterolemia, unspecified: Secondary | ICD-10-CM | POA: Insufficient documentation

## 2022-04-25 LAB — BASIC METABOLIC PANEL
BUN/Creatinine Ratio: 15 (ref 12–28)
BUN: 13 mg/dL (ref 8–27)
CO2: 21 mmol/L (ref 20–29)
Calcium: 9.8 mg/dL (ref 8.7–10.3)
Chloride: 103 mmol/L (ref 96–106)
Creatinine, Ser: 0.85 mg/dL (ref 0.57–1.00)
Glucose: 135 mg/dL — ABNORMAL HIGH (ref 70–99)
Potassium: 4.3 mmol/L (ref 3.5–5.2)
Sodium: 140 mmol/L (ref 134–144)
eGFR: 78 mL/min/{1.73_m2} (ref >59)

## 2022-04-27 ENCOUNTER — Encounter (HOSPITAL_BASED_OUTPATIENT_CLINIC_OR_DEPARTMENT_OTHER): Payer: Self-pay | Admitting: Cardiovascular Disease

## 2022-04-27 ENCOUNTER — Telehealth (HOSPITAL_BASED_OUTPATIENT_CLINIC_OR_DEPARTMENT_OTHER): Payer: Self-pay | Admitting: *Deleted

## 2022-04-27 NOTE — Telephone Encounter (Signed)
Megan May to [facility] Triage (supporting Skeet Latch, MD)       04/27/22  9:13 AM 9/25 120/70 9/24 124/70 9/23 123/74 [name], RN to [name]    04/27/22  8:58 AM Sorry to hear that, can you send me your blood pressure readings since you quit taking it?   Last read by [name] at  9:08 AM on 04/27/2022. Megan May to [name] [facility] Triage (supporting Skeet Latch, MD)       04/27/22  8:52 AM Good Morning, I have had to stop taking the  spironolactone.  I was having extreme pain from constipation and also lower abdominal pain when trying to urinate.  Since going off, three days ago I have felt better. BP has remained under 130/80.  [employer] message sent   Will forward to Dr Oval Linsey for review

## 2022-05-07 ENCOUNTER — Encounter: Payer: Self-pay | Admitting: Family Medicine

## 2022-05-08 ENCOUNTER — Ambulatory Visit (HOSPITAL_BASED_OUTPATIENT_CLINIC_OR_DEPARTMENT_OTHER): Payer: BC Managed Care – PPO | Admitting: Family

## 2022-05-08 ENCOUNTER — Encounter: Payer: Self-pay | Admitting: Family Medicine

## 2022-05-08 ENCOUNTER — Ambulatory Visit (INDEPENDENT_AMBULATORY_CARE_PROVIDER_SITE_OTHER): Payer: BC Managed Care – PPO | Admitting: Family Medicine

## 2022-05-08 VITALS — BP 120/78 | HR 61 | Temp 98.4°F | Ht 66.0 in | Wt 191.0 lb

## 2022-05-08 DIAGNOSIS — Z0184 Encounter for antibody response examination: Secondary | ICD-10-CM | POA: Diagnosis not present

## 2022-05-08 DIAGNOSIS — Z23 Encounter for immunization: Secondary | ICD-10-CM

## 2022-05-08 DIAGNOSIS — Z111 Encounter for screening for respiratory tuberculosis: Secondary | ICD-10-CM

## 2022-05-08 NOTE — Patient Instructions (Signed)
Give [place] 4-7 business days to get the results of your labs back.   Let us know if you need anything.

## 2022-05-08 NOTE — Progress Notes (Signed)
Chief Complaint  Patient presents with   going to camp with daughter and needs updated titers    Subjective: Patient is a 61 y.o. female here for titres.  Patient is going to [place] at the end of the month with her daughter.  She needs titers for MMR and varicella in addition to being screened for tuberculosis.  She is up-to-date with her tetanus shot having received it 3 months ago, 02/10/22.  She is getting a flu shot today.  She denies any close contact with individuals with known tuberculosis.  She denies recent travel to TB endemic areas.  Past Medical History:  Diagnosis Date   Anxiety    Asthma    Depression    history of    Eosinophilic esophagitis    Fatigue 07/02/2017   Generalized anxiety disorder    Hyperglycemia 03/15/2016   Hyperlipidemia, mixed 06/30/2016   Hypersomnia, recurrent    Hypertension    Migraine    Migraines    Posttraumatic stress disorder    Shingles 11/03/2021    Objective: BP 120/78 (BP Location: Left Arm, Patient Position: Sitting, Cuff Size: Normal)   Pulse 61   Temp 98.4 F (36.9 C) (Oral)   Ht _0  (1.676 m)   Wt 191 lb (86.6 kg)   LMP 05/10/2014 (Approximate)   SpO2 98%   BMI 30.83 kg/m  General: Awake, appears stated age Heart: RRR Lungs: CTAB, no rales, wheezes or rhonchi. No accessory muscle use Psych: Age appropriate judgment and insight, normal affect and mood  Assessment and Plan: Immunity status testing - Plan: Measles/Mumps/Rubella Immunity, Varicella zoster antibody, IgG  Screening-pulmonary TB - Plan: QuantiFERON-TB Gold Plus  Need for influenza vaccination - Plan: Flu Vaccine QUAD 6+ mos PF IM (Fluarix Quad PF)  We will check for the above titers.  Screen for TB via blood work as she needs to results by 10/27.  Flu shot today.  She is up-to-date with her tetanus shot.  Follow-up with regular PCP as really scheduled. The patient voiced understanding and agreement to the plan.  [place],  DO 05/08/22  3:08 PM

## 2022-05-09 ENCOUNTER — Encounter: Payer: Self-pay | Admitting: Family Medicine

## 2022-05-09 LAB — MEASLES/MUMPS/RUBELLA IMMUNITY
Mumps IgG: <9 AU/mL — ABNORMAL LOW
Rubella: 19.3 Index
Rubeola IgG: >300 AU/mL

## 2022-05-09 LAB — VARICELLA ZOSTER ANTIBODY, IGG: Varicella IgG: >4000 index

## 2022-05-11 ENCOUNTER — Ambulatory Visit: Payer: Self-pay

## 2022-05-11 ENCOUNTER — Ambulatory Visit: Payer: BC Managed Care – PPO | Admitting: Family Medicine

## 2022-05-11 VITALS — BP 154/88 | HR 62 | Ht 66.0 in | Wt 190.0 lb

## 2022-05-11 DIAGNOSIS — M79662 Pain in left lower leg: Secondary | ICD-10-CM | POA: Diagnosis not present

## 2022-05-11 DIAGNOSIS — M79672 Pain in left foot: Secondary | ICD-10-CM | POA: Diagnosis not present

## 2022-05-11 NOTE — Progress Notes (Signed)
   I, Peterson Lombard, LAT, ATC acting as a scribe for Lynne Leader, MD.  Megan May is a 61 y.o. female who presents to [place] [facility] today for continued left foot pain.  Patient was last seen by Dr. Georgina Snell on 09/25/2021 for a follow-up fracture of the fifth metatarsal of the left foot.  At that time, patient had completed 7 physical therapy visits, and was feeling 90% better.  Today, patient reports in Aug she playing kickball w/ her daughter, she kicked the ball, ran 10 steps, and fell to the ground. Pt locates pain to the L calcaneous, through Achilles and into the gastroc. Pt c/o increased pain w/ DF, feeling like a "tearing" pain. Pt notes numbness along the L heel.   Dx imaging: 08/28/21 L foot XR 06/25/21 L foot XR 06/11/21 L foot XR  05/29/21 L foot & ankle XR 05/15/21 L foot & ankle XR 05/01/21 L foot & ankle XR (@ [facility])  Pertinent review of systems: No fevers or chills  Relevant historical information: History of left fifth metatarsal fracture in the past.  Hypertension.   Exam:  BP (!) 154/88   Pulse 62   Ht '5\' 6"'$  (1.676 m)   Wt 190 lb (86.2 kg)   LMP 05/10/2014 (Approximate)   SpO2 98%   BMI 30.67 kg/m  General: Well Developed, well nourished, and in no acute distress.   MSK: Left calf normal-appearing.  No swelling or erythema.  No palpable cords. Normal foot and ankle motion.  Pain with dorsiflexion of the foot and ankle. Some pain with resisted foot plantarflexion.    Lab and Radiology Results  Diagnostic Limited MSK Ultrasound of: Left calf and Achilles tendon Achilles tendon is intact without tear.  Mild calcific change present at distal Achilles tendon near insertion on the calcaneus. Gastrocnemius intact medial and lateral heads without visible tear or significant hypoechoic change. 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      Assessment and Plan: 61 y.o.  female with left calf and Achilles tendon pain thought to be Achilles tendinopathy and probable prior calf strain.  Plan to treat with eccentric exercises targeting both the gastrocnemius muscle and Achilles tendon.  Additionally recommend calf compression sleeve.  Check back in 1 month.  If not improved consider x-ray of L-spine and tib-fib and further work-up and potentially do physical therapy.   PDMP not reviewed this encounter. Orders Placed This Encounter  Procedures   [place] LIMITED JOINT SPACE STRUCTURES LOW LEFT(NO LINKED CHARGES)    Order Specific Question:   Reason for Exam (SYMPTOM  OR DIAGNOSIS REQUIRED)    Answer:   left foot pain    Order Specific Question:   Preferred imaging location?    Answer:   [place]   No orders of the defined types were placed in this encounter.    Discussed warning signs or symptoms. Please see discharge instructions. Patient expresses understanding.   The above documentation has been reviewed and is accurate and complete Lynne Leader, M.D.

## 2022-05-11 NOTE — Patient Instructions (Addendum)
Thank you for coming in today.   Please complete the exercises that the athletic trainer went over with you:  View at www.my-exercise-code.com using code: M7JWSAU  I recommend you obtained a compression sleeve to help with your joint problems. There are many options on the market however I recommend obtaining a Full Calf Body Helix compression sleeve.  You can find information (including how to appropriate measure yourself for sizing) can be found at www.Body http://www.lambert.com/.  Many of these products are health savings account (HSA) eligible.  You can use the compression sleeve at any time throughout the day but is most important to use while being active as well as for 2 hours post-activity.   It is appropriate to ice following activity with the compression sleeve in place.   Check back in 1 month

## 2022-05-12 ENCOUNTER — Ambulatory Visit (INDEPENDENT_AMBULATORY_CARE_PROVIDER_SITE_OTHER): Payer: BC Managed Care – PPO

## 2022-05-12 DIAGNOSIS — Z23 Encounter for immunization: Secondary | ICD-10-CM

## 2022-05-12 NOTE — Progress Notes (Signed)
Megan May is a 61 y.o. female presents to the office today for MMR vaccine, per physician's orders. Original order: 05/11/22: "She just needs one and can just sched a nurse visit. I am not aware of a mumps only, just MMR which is safe." MMR vaccine 0.5 mL SQ (route) was administered R arm (location) today. Patient tolerated injection. Patient due for follow up labs/provider appt: No.  Patient next injection due: n/a  [place], [place]

## 2022-05-13 LAB — QUANTIFERON-TB GOLD PLUS
Mitogen-NIL: 8.24 IU/mL
NIL: 0.02 IU/mL
QuantiFERON-TB Gold Plus: NEGATIVE
TB1-NIL: 0 IU/mL
TB2-NIL: 0 IU/mL

## 2022-05-14 ENCOUNTER — Ambulatory Visit (HOSPITAL_BASED_OUTPATIENT_CLINIC_OR_DEPARTMENT_OTHER): Payer: BC Managed Care – PPO | Admitting: Family

## 2022-05-14 ENCOUNTER — Encounter (HOSPITAL_BASED_OUTPATIENT_CLINIC_OR_DEPARTMENT_OTHER): Payer: Self-pay | Admitting: Family

## 2022-05-14 VITALS — BP 114/70 | HR 63 | Ht 66.0 in | Wt 191.1 lb

## 2022-05-14 DIAGNOSIS — I10 Essential (primary) hypertension: Secondary | ICD-10-CM | POA: Diagnosis not present

## 2022-05-14 DIAGNOSIS — Z6830 Body mass index (BMI) 30.0-30.9, adult: Secondary | ICD-10-CM

## 2022-05-14 NOTE — Progress Notes (Signed)
Office Visit    Patient Name: Megan May Date of Encounter: 05/17/2022  PCP:  Mosie Lukes, MD   [place]  Cardiologist:  Skeet Latch, MD  Advanced Practice Provider:  No care team member to display Electrophysiologist:  None     Chief Complaint    Megan May is a 61 y.o. female presents today for hypertension follow up   Past Medical History    Past Medical History:  Diagnosis Date   Anxiety    Asthma    Depression    history of    Eosinophilic esophagitis    Fatigue 07/02/2017   Generalized anxiety disorder    Hyperglycemia 03/15/2016   Hyperlipidemia, mixed 06/30/2016   Hypersomnia, recurrent    Hypertension    Migraine    Migraines    Posttraumatic stress disorder    Shingles 11/03/2021   Past Surgical History:  Procedure Laterality Date   CESAREAN SECTION  2004   ESOPHAGOGASTRODUODENOSCOPY (EGD) WITH PROPOFOL N/A 01/12/2019   Procedure: ESOPHAGOGASTRODUODENOSCOPY (EGD) WITH PROPOFOL;  Surgeon: Carol Ada, MD;  Location: [facility];  Service: Endoscopy;  Laterality: N/A;   FRACTURE SURGERY  1973   R arm   LAPAROTOMY Bilateral 06/14/2014   Procedure: LAPAROTOMY with right  salpingo-oophorectomy and left salpingectomy;  Surgeon: Linda Hedges, DO;  Location: [facility];  Service: Gynecology;  Laterality: Bilateral;   TONSILLECTOMY AND ADENOIDECTOMY  1969    Allergies  Allergies  Allergen Reactions   Amoxicillin-Pot Clavulanate Rash   Doxycycline Other (See Comments)    headache   Levaquin [Levofloxacin In D5w] Other (See Comments)    tendonopathy   Clindamycin Itching   Clindamycin/Lincomycin Itching   Lactose Intolerance (Gi) Nausea Only   Metoprolol     Weight gain/fluid retention    Penicillin G Other (See Comments)   Aspirin Other (See Comments)    Migraine headache   Latex Rash   Penicillins Swelling and Rash    Did it involve swelling of the face/tongue/throat, SOB, or low BP?  No Did it involve sudden or severe rash/hives, skin peeling, or any reaction on the inside of your mouth or nose? Yes Did you need to seek medical attention at a hospital or doctor's office? No When did it last happen?      61 yo If all above answers are "NO", may proceed with cephalosporin use.    History of Present Illness    Megan May is a 61 y.o. female with a hx of HTN, LE edema, obesity last seen 04/07/22.  Follows with Dr. Oval Linsey. At last visit 04/07/22 Spironolactone initiated for elevated blood pressure. Due to concern for episode of fall vs syncope coronary calcium score performed 04/09/22 with coronary calcium score of 0.   Presents today for follow up. Works in Air[employer] pilot. Had to stop Spironolactone due to constipation and lower abdominal pain. BP at home 116-120. Following DASH diet and vegetarian at home. Tells me she is "scared of anything with flour" that might cause weight gain. Feels like "menopause has smacked her in the face". She is intermittently fasting and eating 12 pm - 7 pm. Has not been exercising as much due to difficulties due to neuropathy. She has been doing weights at home. Has a goal to add in a recumbent bike. Does walk the dog for exercise. Frustrated by her inability to lose weight, advised against GLP1 by gastroenterology.   EKGs/Labs/Other Studies Reviewed:   The following studies were  reviewed today:   EKG: No EKG today.   Recent Labs: 02/10/2022: ALT 14; Hemoglobin 13.5; Platelets 220.0 04/24/2022: BUN 13; Creatinine, Ser 0.85; Potassium 4.3; Sodium 140  Recent Lipid Panel    Component Value Date/Time   CHOL 210 (H) 02/10/2022 1346   TRIG 132.0 02/10/2022 1346   HDL 63.20 02/10/2022 1346   CHOLHDL 3 02/10/2022 1346   VLDL 26.4 02/10/2022 1346   LDLCALC 121 (H) 02/10/2022 1346   Home Medications   Current Meds  Medication Sig   DUPIXENT 300 MG/2ML SOPN Inject into the skin.   lisinopril (ZESTRIL) 20 MG tablet Take 1 tablet  (20 mg total) by mouth daily.     Review of Systems      All other systems reviewed and are otherwise negative except as noted above.  Physical Exam    VS:  BP 114/70 (BP Location: Left Arm) Comment: Home BP cuff  Pulse 63   Ht '5\' 6"'$  (1.676 m)   Wt 191 lb 1.6 oz (86.7 kg)   LMP 05/10/2014 (Approximate)   SpO2 97%   BMI 30.84 kg/m  , BMI Body mass index is 30.84 kg/m.  Wt Readings from Last 3 Encounters:  05/14/22 191 lb 1.6 oz (86.7 kg)  05/11/22 190 lb (86.2 kg)  05/08/22 191 lb (86.6 kg)     GEN: Well nourished, well developed, in no acute distress. HEENT: normal. Neck: Supple, no JVD, carotid bruits, or masses. Cardiac: RRR, no murmurs, rubs, or gallops. No clubbing, cyanosis, edema.  Radials/PT 2+ and equal bilaterally.  Respiratory:  Respirations regular and unlabored, clear to auscultation bilaterally. GI: Soft, nontender, nondistended. MS: No deformity or atrophy. Skin: Warm and dry, no rash. Neuro:  Strength and sensation are intact. Psych: Normal affect.  Assessment & Plan    HTN - BP well controlled. Continue current antihypertensive regimen.    Obesity - Weight loss via diet and exercise encouraged. Discussed the impact being overweight would have on cardiovascular risk. Advised against GLP1 by gastroenterology. Refer to PREP exercise program.       Disposition: Follow up in 6 month(s) with Skeet Latch, MD or APP.  Signed, Loel Dubonnet, NP 05/17/2022, 1:46 PM [facility]

## 2022-05-14 NOTE — Patient Instructions (Addendum)
Medication Instructions:  Continue your current medications.   *If you need a refill on your cardiac medications before your next appointment, please call your pharmacy*  Lab Work/Testing/Procedures: None ordered today.   Follow-Up: At [facility], you and your health needs are our priority.  As part of our continuing mission to provide you with exceptional heart care, we have created designated Provider Care Teams.  These Care Teams include your primary Cardiologist (physician) and Advanced Practice Providers (APPs -  Physician Assistants and Nurse Practitioners) who all work together to provide you with the care you need, when you need it.  We recommend signing up for the patient portal called "MyChart".  Sign up information is provided on this After Visit Summary.  MyChart is used to connect with patients for Virtual Visits (Telemedicine).  Patients are able to view lab/test results, encounter notes, upcoming appointments, etc.  Non-urgent messages can be sent to your provider as well.   To learn more about what you can do with MyChart, go to [email or web address].    Your next appointment:   6 month(s)  The format for your next appointment:   In Person  Provider:   Skeet Latch, MD    Other Instructions  Heart Healthy Diet Recommendations: A low-salt diet is recommended. Meats should be grilled, baked, or boiled. Avoid fried foods. Focus on lean protein sources like fish or chicken with vegetables and fruits. The American Heart Association is a Microbiologist!  American Heart Association Diet and Lifeystyle Recommendations   Exercise recommendations: The American Heart Association recommends 150 minutes of moderate intensity exercise weekly. Try 30 minutes of moderate intensity exercise 4-5 times per week. This could include walking, jogging, or swimming.  Important Information About Sugar

## 2022-05-17 ENCOUNTER — Encounter (HOSPITAL_BASED_OUTPATIENT_CLINIC_OR_DEPARTMENT_OTHER): Payer: Self-pay | Admitting: Family

## 2022-06-12 ENCOUNTER — Ambulatory Visit: Payer: BC Managed Care – PPO | Admitting: Family Medicine

## 2022-08-12 ENCOUNTER — Encounter: Payer: Self-pay | Admitting: Family Medicine

## 2022-08-12 ENCOUNTER — Telehealth: Payer: BC Managed Care – PPO | Admitting: Family

## 2022-08-12 DIAGNOSIS — U071 COVID-19: Secondary | ICD-10-CM

## 2022-08-12 DIAGNOSIS — R519 Headache, unspecified: Secondary | ICD-10-CM | POA: Insufficient documentation

## 2022-08-12 DIAGNOSIS — I10 Essential (primary) hypertension: Secondary | ICD-10-CM | POA: Insufficient documentation

## 2022-08-12 MED ORDER — MOLNUPIRAVIR EUA 200MG CAPSULE: 4.0000 | ORAL_CAPSULE | Freq: Two times a day (BID) | ORAL | 0 refills | Status: AC

## 2022-08-12 NOTE — Progress Notes (Signed)
Virtual Visit Consent   Megan May, you are scheduled for a virtual visit with a [facility] provider today. Just as with appointments in the office, your consent must be obtained to participate. Your consent will be active for this visit and any virtual visit you may have with one of our providers in the next 365 days. If you have a MyChart account, a copy of this consent can be sent to you electronically.  As this is a virtual visit, video technology does not allow for your provider to perform a traditional examination. This may limit your provider's ability to fully assess your condition. If your provider identifies any concerns that need to be evaluated in person or the need to arrange testing (such as labs, EKG, etc.), we will make arrangements to do so. Although advances in technology are sophisticated, we cannot ensure that it will always work on either your end or our end. If the connection with a video visit is poor, the visit may have to be switched to a telephone visit. With either a video or telephone visit, we are not always able to ensure that we have a secure connection.  By engaging in this virtual visit, you consent to the provision of healthcare and authorize for your insurance to be billed (if applicable) for the services provided during this visit. Depending on your insurance coverage, you may receive a charge related to this service.  I need to obtain your verbal consent now. Are you willing to proceed with your visit today? Megan May has provided verbal consent on 08/12/2022 for a virtual visit (video or telephone). Evelina Dun, FNP  Date: 08/12/2022 2:57 PM  Virtual Visit via Video Note   I, Evelina Dun, connected with  Megan May  ([id number], 1961-01-03) on 08/12/22 at  3:00 PM EST by a video-enabled telemedicine application and verified that I am speaking with the correct person using two identifiers.  Location: Patient: Virtual  Visit Location Patient: Home Provider: Virtual Visit Location Provider: Home Office   I discussed the limitations of evaluation and management by telemedicine and the availability of in person appointments. The patient expressed understanding and agreed to proceed.    History of Present Illness: Megan May is a 62 y.o. who identifies as a female who was assigned female at birth, and is being seen today for COVID. She reports her symptoms started yesterday and tested positive last night.   HPI: URI  This is a new problem. The current episode started yesterday. The problem has been gradually worsening. Maximum temperature: 99. Associated symptoms include congestion, ear pain, headaches, joint pain, sinus pain and sneezing. Pertinent negatives include no coughing, rhinorrhea or sore throat. She has tried acetaminophen for the symptoms. The treatment provided mild relief.    Problems:  Patient Active Problem List   Diagnosis Date Noted   Headache 08/12/2022   Hypertension 08/12/2022   Generalized anxiety disorder 07/22/2021   PTSD (post-traumatic stress disorder) 07/22/2021   Closed nondisplaced fracture of fifth left metatarsal bone 05/01/2021   Blepharitis 03/26/2021   Patellofemoral arthritis of right knee 03/25/2018   Chronic pain of right knee 03/16/2018   Injury of cervical spine ([place]) 03/16/2018   Fatigue 07/02/2017   Hyperlipidemia, mixed 06/30/2016   Hyperglycemia 03/15/2016   Atypical chest pain 01/17/2016   RUQ pain 01/17/2016   Right hip pain 03/24/2015   Leukocytopenia 03/24/2015   Bursitis of left shoulder 03/22/2015   Greater trochanteric bursitis of right hip 03/22/2015  S/P laparotomy 06/14/2014   Diverticulosis of colon without hemorrhage  colonoscopy 2015 05/27/2014   Colon polyp 05/02/2014   Microscopic hematuria work up urology  see 01/2014 note 04/22/2014   Hypersomnia, recurrent    Dysarthria 05/01/2013   Mastodynia 10/13/2012   Mass of right  breast 10/13/2012   Essential hypertension, benign 06/29/2011   Migraines 06/29/2011   Anxiety 25/36/6440   Eosinophilic esophagitis 34/74/2595   Lupus anticoagulant positive 06/29/2011    Allergies:  Allergies  Allergen Reactions   Amoxicillin-Pot Clavulanate Rash   Doxycycline Other (See Comments)    headache   Levaquin [Levofloxacin In D5w] Other (See Comments)    tendonopathy   Clindamycin Itching   Clindamycin/Lincomycin Itching   Lactose Intolerance (Gi) Nausea Only   Metoprolol     Weight gain/fluid retention    Penicillin G Other (See Comments)   Aspirin Other (See Comments)    Migraine headache   Latex Rash   Penicillins Swelling and Rash    Did it involve swelling of the face/tongue/throat, SOB, or low BP? No Did it involve sudden or severe rash/hives, skin peeling, or any reaction on the inside of your mouth or nose? Yes Did you need to seek medical attention at a hospital or doctor's office? No When did it last happen?      62 yo If all above answers are "NO", may proceed with cephalosporin use.   Medications:  Current Outpatient Medications:    molnupiravir EUA (LAGEVRIO) 200 mg CAPS capsule, Take 4 capsules (800 mg total) by mouth 2 (two) times daily for 5 days., Disp: 40 capsule, Rfl: 0   DUPIXENT 300 MG/2ML SOPN, Inject into the skin., Disp: , Rfl:    lisinopril (ZESTRIL) 20 MG tablet, Take 1 tablet (20 mg total) by mouth daily., Disp: 90 tablet, Rfl: 3  Observations/Objective: Patient is well-developed, well-nourished in no acute distress.  Resting comfortably  at home.  Head is normocephalic, atraumatic.  No labored breathing.  Speech is clear and coherent with logical content.  Patient is alert and oriented at baseline.  Nasal congestion  Assessment and Plan: 1. COVID-19 - molnupiravir EUA (LAGEVRIO) 200 mg CAPS capsule; Take 4 capsules (800 mg total) by mouth 2 (two) times daily for 5 days.  Dispense: 40 capsule; Refill: 0  COVID positive, rest,  force fluids, tylenol as needed, Quarantine for at least 5 days and you are fever free, then must wear a mask out in public from day 6-38, report any worsening symptoms such as increased shortness of breath, swelling, or continued high fevers. Possible adverse effects discussed with antivirals.    Follow Up Instructions: I discussed the assessment and treatment plan with the patient. The patient was provided an opportunity to ask questions and all were answered. The patient agreed with the plan and demonstrated an understanding of the instructions.  A copy of instructions were sent to the patient via MyChart unless otherwise noted below.     The patient was advised to call back or seek an in-person evaluation if the symptoms worsen or if the condition fails to improve as anticipated.  Time:  I spent 6 minutes with the patient via telehealth technology discussing the above problems/concerns.    Evelina Dun, FNP

## 2022-08-24 ENCOUNTER — Ambulatory Visit: Payer: BC Managed Care – PPO | Admitting: Family Medicine

## 2022-09-05 ENCOUNTER — Telehealth: Payer: BC Managed Care – PPO | Admitting: Nurse Practitioner

## 2022-09-05 DIAGNOSIS — H00012 Hordeolum externum right lower eyelid: Secondary | ICD-10-CM

## 2022-09-05 MED ORDER — BACITRA-NEOMYCIN-POLYMYXIN-HC 1 % OP OINT: TOPICAL_OINTMENT | OPHTHALMIC | 0 refills | Status: DC

## 2022-09-05 NOTE — Progress Notes (Signed)
Virtual Visit Consent   Megan May, you are scheduled for a virtual visit with a [facility] provider today. Just as with appointments in the office, your consent must be obtained to participate. Your consent will be active for this visit and any virtual visit you may have with one of our providers in the next 365 days. If you have a MyChart account, a copy of this consent can be sent to you electronically.  As this is a virtual visit, video technology does not allow for your provider to perform a traditional examination. This may limit your provider's ability to fully assess your condition. If your provider identifies any concerns that need to be evaluated in person or the need to arrange testing (such as labs, EKG, etc.), we will make arrangements to do so. Although advances in technology are sophisticated, we cannot ensure that it will always work on either your end or our end. If the connection with a video visit is poor, the visit may have to be switched to a telephone visit. With either a video or telephone visit, we are not always able to ensure that we have a secure connection.  By engaging in this virtual visit, you consent to the provision of healthcare and authorize for your insurance to be billed (if applicable) for the services provided during this visit. Depending on your insurance coverage, you may receive a charge related to this service.  I need to obtain your verbal consent now. Are you willing to proceed with your visit today? Megan May has provided verbal consent on 09/05/2022 for a virtual visit (video or telephone). Gildardo Pounds, NP  Date: 09/05/2022 7:02 PM  Virtual Visit via Video Note   I, Gildardo Pounds, connected with  Megan May  ([id number], 04-18-1961) on 09/05/22 at  7:00 PM EST by a video-enabled telemedicine application and verified that I am speaking with the correct person using two identifiers.  Location: Patient: Virtual  Visit Location Patient: Home Provider: Virtual Visit Location Provider: Home Office   I discussed the limitations of evaluation and management by telemedicine and the availability of in person appointments. The patient expressed understanding and agreed to proceed.    History of Present Illness: Megan May is a 62 y.o. who identifies as a female who was assigned female at birth, and is being seen today for right eye hordeolum.  [name] endorses right lower eyelid swelling and pimple inside lower right eyelid that started draining "pus". There are no visual changes. Eyelid is tender to touch and she states there is another pimple forming in the corner of the right lower eyelid. She is currently wearing glasses.    Problems:  Patient Active Problem List   Diagnosis Date Noted   Headache 08/12/2022   Hypertension 08/12/2022   Generalized anxiety disorder 07/22/2021   PTSD (post-traumatic stress disorder) 07/22/2021   Closed nondisplaced fracture of fifth left metatarsal bone 05/01/2021   Blepharitis 03/26/2021   Patellofemoral arthritis of right knee 03/25/2018   Chronic pain of right knee 03/16/2018   Injury of cervical spine ([place]) 03/16/2018   Fatigue 07/02/2017   Hyperlipidemia, mixed 06/30/2016   Hyperglycemia 03/15/2016   Atypical chest pain 01/17/2016   RUQ pain 01/17/2016   Right hip pain 03/24/2015   Leukocytopenia 03/24/2015   Bursitis of left shoulder 03/22/2015   Greater trochanteric bursitis of right hip 03/22/2015   S/P laparotomy 06/14/2014   Diverticulosis of colon without hemorrhage  colonoscopy 2015 05/27/2014  Colon polyp 05/02/2014   Microscopic hematuria work up urology  see 01/2014 note 04/22/2014   Hypersomnia, recurrent    Dysarthria 05/01/2013   Mastodynia 10/13/2012   Mass of right breast 10/13/2012   Essential hypertension, benign 06/29/2011   Migraines 06/29/2011   Anxiety 19/14/7829   Eosinophilic esophagitis 56/21/3086   Lupus  anticoagulant positive 06/29/2011    Allergies:  Allergies  Allergen Reactions   Amoxicillin-Pot Clavulanate Rash   Doxycycline Other (See Comments)    headache   Levaquin [Levofloxacin In D5w] Other (See Comments)    tendonopathy   Clindamycin Itching   Clindamycin/Lincomycin Itching   Lactose Intolerance (Gi) Nausea Only   Metoprolol     Weight gain/fluid retention    Penicillin G Other (See Comments)   Aspirin Other (See Comments)    Migraine headache   Latex Rash   Penicillins Swelling and Rash    Did it involve swelling of the face/tongue/throat, SOB, or low BP? No Did it involve sudden or severe rash/hives, skin peeling, or any reaction on the inside of your mouth or nose? Yes Did you need to seek medical attention at a hospital or doctor's office? No When did it last happen?      62 yo If all above answers are "NO", may proceed with cephalosporin use.   Medications:  Current Outpatient Medications:    bacitracin-neomycin-polymyxin-hydrocortisone (CORTISPORIN) 1 % ophthalmic ointment, Apply to inside of lower lid of affected eye(s) every 4 hours, Disp: 3.5 g, Rfl: 0   DUPIXENT 300 MG/2ML SOPN, Inject into the skin., Disp: , Rfl:    lisinopril (ZESTRIL) 20 MG tablet, Take 1 tablet (20 mg total) by mouth daily., Disp: 90 tablet, Rfl: 3  Observations/Objective: Patient is well-developed, well-nourished in no acute distress.  Resting comfortably  at home.  Head is normocephalic, atraumatic.  No labored breathing.  Speech is clear and coherent with logical content.  Patient is alert and oriented at baseline.  Right lower eyelid is swollen. Inside of lower lid when pulled down reveals hordeolum  Assessment and Plan: 1. Hordeolum externum of right lower eyelid - bacitracin-neomycin-polymyxin-hydrocortisone (CORTISPORIN) 1 % ophthalmic ointment; Apply to inside of lower lid of affected eye(s) every 4 hours  Dispense: 3.5 g; Refill: 0  Apply warm compresses to right eye  every 15 minutes for pain relief.   Follow Up Instructions: I discussed the assessment and treatment plan with the patient. The patient was provided an opportunity to ask questions and all were answered. The patient agreed with the plan and demonstrated an understanding of the instructions.  A copy of instructions were sent to the patient via MyChart unless otherwise noted below.    The patient was advised to call back or seek an in-person evaluation if the symptoms worsen or if the condition fails to improve as anticipated.  Time:  I spent 12 minutes with the patient via telehealth technology discussing the above problems/concerns.    Gildardo Pounds, NP

## 2022-09-05 NOTE — Patient Instructions (Signed)
  Megan May, thank you for joining Gildardo Pounds, NP for today's virtual visit.  While this provider is not your primary care provider (PCP), if your PCP is located in our provider database this encounter information will be shared with them immediately following your visit.   [facility] account gives you access to today's visit and all your visits, tests, and labs performed at [facility] " click here if you don't have a [facility] account or go to [email or web address]  Consent: (Patient) Megan May provided verbal consent for this virtual visit at the beginning of the encounter.  Current Medications:  Current Outpatient Medications:    bacitracin-neomycin-polymyxin-hydrocortisone (CORTISPORIN) 1 % ophthalmic ointment, Apply to inside of lower lid of affected eye(s) every 4 hours, Disp: 3.5 g, Rfl: 0   DUPIXENT 300 MG/2ML SOPN, Inject into the skin., Disp: , Rfl:    lisinopril (ZESTRIL) 20 MG tablet, Take 1 tablet (20 mg total) by mouth daily., Disp: 90 tablet, Rfl: 3   Medications ordered in this encounter:  Meds ordered this encounter  Medications   bacitracin-neomycin-polymyxin-hydrocortisone (CORTISPORIN) 1 % ophthalmic ointment    Sig: Apply to inside of lower lid of affected eye(s) every 4 hours    Dispense:  3.5 g    Refill:  0    Order Specific Question:   Supervising Provider    Answer:   Chase Picket [[id number]]     *If you need refills on other medications prior to your next appointment, please contact your pharmacy*  Follow-Up: Call back or seek an in-person evaluation if the symptoms worsen or if the condition fails to improve as anticipated.  [place] [phone]  Other Instructions Apply warm compresses to right eye every 15 minutes for pain relief.    If you have been instructed to have an in-person evaluation today at a local Urgent Care facility, please use the link below. It  will take you to a list of all of our available [facility], including address, phone number and hours of operation. Please do not delay care.  [facility]  If you or a family member do not have a primary care provider, use the link below to schedule a visit and establish care. When you choose a [facility] primary care physician or advanced practice provider, you gain a long-term partner in health. Find a Primary Care Provider  Learn more about [facility]'s in-office and virtual care options: [facility]

## 2022-09-07 ENCOUNTER — Other Ambulatory Visit: Payer: Self-pay | Admitting: Nurse Practitioner

## 2022-09-07 DIAGNOSIS — H0100A Unspecified blepharitis right eye, upper and lower eyelids: Secondary | ICD-10-CM

## 2022-09-07 MED ORDER — ERYTHROMYCIN 5 MG/GM OP OINT: 1.0000 | TOPICAL_OINTMENT | Freq: Every day | OPHTHALMIC | 0 refills | Status: DC

## 2022-09-30 ENCOUNTER — Other Ambulatory Visit: Payer: Self-pay | Admitting: Family

## 2022-09-30 DIAGNOSIS — R599 Enlarged lymph nodes, unspecified: Secondary | ICD-10-CM

## 2022-10-14 ENCOUNTER — Other Ambulatory Visit: Payer: Self-pay | Admitting: Family

## 2022-10-14 ENCOUNTER — Ambulatory Visit
Admission: RE | Admit: 2022-10-14 | Discharge: 2022-10-14 | Disposition: A | Discharge status: Home / Self Care | Payer: BC Managed Care – PPO | Source: Ambulatory Visit | Attending: Obstetrics & Gynecology | Admitting: Obstetrics & Gynecology

## 2022-10-14 DIAGNOSIS — R599 Enlarged lymph nodes, unspecified: Secondary | ICD-10-CM

## 2022-10-19 ENCOUNTER — Other Ambulatory Visit: Payer: Self-pay | Admitting: Family

## 2022-10-19 DIAGNOSIS — R2232 Localized swelling, mass and lump, left upper limb: Secondary | ICD-10-CM

## 2022-11-14 ENCOUNTER — Ambulatory Visit
Admission: RE | Admit: 2022-11-14 | Discharge: 2022-11-14 | Disposition: A | Discharge status: Home / Self Care | Payer: BC Managed Care – PPO | Source: Ambulatory Visit | Attending: Family | Admitting: Family

## 2022-11-14 DIAGNOSIS — R2232 Localized swelling, mass and lump, left upper limb: Secondary | ICD-10-CM

## 2023-01-22 ENCOUNTER — Ambulatory Visit: Payer: BC Managed Care – PPO | Admitting: Family Medicine

## 2023-01-22 ENCOUNTER — Telehealth: Payer: Self-pay | Admitting: Family Medicine

## 2023-01-22 VITALS — BP 129/89 | HR 74 | Temp 98.1°F | Ht 66.0 in | Wt 185.0 lb

## 2023-01-22 DIAGNOSIS — H6123 Impacted cerumen, bilateral: Secondary | ICD-10-CM | POA: Diagnosis not present

## 2023-01-22 DIAGNOSIS — H9202 Otalgia, left ear: Secondary | ICD-10-CM | POA: Diagnosis not present

## 2023-01-22 DIAGNOSIS — J0100 Acute maxillary sinusitis, unspecified: Secondary | ICD-10-CM

## 2023-01-22 MED ORDER — CEFDINIR 300 MG PO CAPS: 300.0000 mg | ORAL_CAPSULE | Freq: Two times a day (BID) | ORAL | 0 refills | Status: AC

## 2023-01-22 NOTE — Telephone Encounter (Signed)
Pt states the pharmacy said since she is allergic to penicillin she should probably not take cefdinir (OMNICEF) 300 MG capsule and would like a z-pack sent instead.   [id number] [place], [place] - [address] AT [facility] [address] [place] [address] Phone: [phone]  Fax: [phone]

## 2023-01-22 NOTE — Telephone Encounter (Signed)
Patient made aware okay to take medication. She expressed understanding.

## 2023-01-22 NOTE — Telephone Encounter (Signed)
Ladona Ridgel saw patient today, but is out of the office this pm. Please advise.

## 2023-01-22 NOTE — Progress Notes (Signed)
Acute Office Visit  Subjective:     Patient ID: Megan May, female    DOB: 12/24/1960, 62 y.o.   MRN: [id number]  Chief Complaint  Patient presents with   Ear Problem    HPI Patient is in today for ear pain, sinus pressure.   Discussed the use of AI scribe software for clinical note transcription with the patient, who gave verbal consent to proceed.  History of Present Illness   The patient presents with left ear pain and pressure that has been ongoing for a couple of weeks. The pain, described as 'unbelievable,' radiates to left maxillary sinus. The pain is intermittent, with periods of relief, and is responsive to Advil. The patient also reports a sensation of pressure in the sinuses, but denies fever, chills, body aches, and recent cold or allergy symptoms. She has a history of sinus infections, typically presenting with severe tooth pain, but denies any current dental issues. The patient has no known allergies to cephalosporins and has previously tolerated Z-packs.         ROS All review of systems negative except what is listed in the HPI      Objective:    BP 129/89   Pulse 74   Temp 98.1 F (36.7 C) (Oral)   Ht 5\' 6"  (1.676 m)   Wt 185 lb (83.9 kg)   LMP 05/10/2014 (Approximate)   SpO2 98%   BMI 29.86 kg/m    Physical Exam Vitals reviewed.  Constitutional:      Appearance: Normal appearance.  HENT:     Right Ear: There is impacted cerumen.     Left Ear: There is impacted cerumen.     Ears:     Comments: After ear irrigation: right TM normal; left TM mild erythema     Nose:     Left Sinus: Maxillary sinus tenderness present.  Cardiovascular:     Rate and Rhythm: Normal rate and regular rhythm.     Pulses: Normal pulses.     Heart sounds: Normal heart sounds.  Pulmonary:     Effort: Pulmonary effort is normal.     Breath sounds: Normal breath sounds.  Skin:    General: Skin is warm and dry.  Neurological:     Mental Status: She is  alert and oriented to person, place, and time.  Psychiatric:        Mood and Affect: Mood normal.        Behavior: Behavior normal.        Thought Content: Thought content normal.        Judgment: Judgment normal.     No results found for any visits on 01/22/23.      Assessment & Plan:   Problem List Items Addressed This Visit   None Visit Diagnoses     Ear pain, left    -  Primary   Relevant Medications   cefdinir (OMNICEF) 300 MG capsule   Acute non-recurrent maxillary sinusitis       Relevant Medications   cefdinir (OMNICEF) 300 MG capsule   Bilateral impacted cerumen         Mild erythema on left TM with significant left maxillary sinus pressure on palpation. Adding Cefdinir (states she tolerates cephalosporins well). Recommend warm compresses, tylenol, ibuprofen. Patient aware of signs/symptoms requiring further/urgent evaluation.     Indication: Cerumen impaction of the ear(s)  Medical necessity statement: On physical examination, cerumen impairs clinically significant portions of the external auditory canal, and tympanic  membrane. Noted obstructive, copious cerumen that cannot be removed without magnification and instrumentations requiring professional removal.   Consent: Discussed benefits and risks of procedure and verbal consent obtained  Procedure: Patient was prepped for the procedure. Otoscope utilized to assess and take note of the ear canal, the tympanic membrane, and the presence, amount, and placement of the cerumen.  Gentle irrigation with water at body temperature and soft plastic curette utilized to remove impacted cerumen.  Excess water drained by gravity and ear canal(s) dried with clean guaze.  Post procedure examination: Otoscopic examination reveals complete cerumen removal with no damage to the auditory canal, tympanic membrane, or surrounding tissue.  Patient tolerated procedure well.   Post procedure instructions: Patient made aware that they  may experience temporary vertigo, temporary changes in hearing, and temporary discomfort. If these symptom last for more than 24 hours to call the clinic or proceed to the ED for further evaluation. Discussed avoiding placing objects into the ear canal for cleaning.    Meds ordered this encounter  Medications   cefdinir (OMNICEF) 300 MG capsule    Sig: Take 1 capsule (300 mg total) by mouth 2 (two) times daily for 7 days.    Dispense:  14 capsule    Refill:  0    Order Specific Question:   Supervising Provider    Answer:   Danise Edge A [[id number]]    Return if symptoms worsen or fail to improve.  Clayborne Dana, NP

## 2023-01-22 NOTE — Telephone Encounter (Signed)
I reviewed the note, the patient reported she is able to tolerate cephalosporins (like Omnicef) despite her penicillin allergy.  Advised pharmacy that is okay to proceed.

## 2023-02-08 ENCOUNTER — Ambulatory Visit: Payer: BC Managed Care – PPO | Admitting: Family Medicine

## 2023-02-08 ENCOUNTER — Encounter: Payer: Self-pay | Admitting: Family Medicine

## 2023-02-08 VITALS — BP 117/52 | HR 68 | Temp 98.1°F | Resp 18 | Ht 66.0 in | Wt 188.6 lb

## 2023-02-08 DIAGNOSIS — J0141 Acute recurrent pansinusitis: Secondary | ICD-10-CM | POA: Diagnosis not present

## 2023-02-08 DIAGNOSIS — H6593 Unspecified nonsuppurative otitis media, bilateral: Secondary | ICD-10-CM

## 2023-02-08 MED ORDER — AZITHROMYCIN 250 MG PO TABS: ORAL_TABLET | ORAL | 0 refills | Status: AC

## 2023-02-08 NOTE — Patient Instructions (Signed)
Zpak added Take Flonase twice daily  Continue supportive measures including rest, hydration, humidifier use, steam showers, warm compresses to sinuses, warm liquids with lemon and honey, and over-the-counter cough, cold, and analgesics as needed.  ENT referral if not improving

## 2023-02-08 NOTE — Progress Notes (Signed)
Acute Office Visit  Subjective:     Patient ID: Megan May, female    DOB: [birth year], 62 y.o.   MRN: [id number]  Chief Complaint  Patient presents with   bilateral ear pain    Was seen on 01/22/23. Given Omnicef. Some relief. Completed 5 days ago.     HPI Patient is in today for ear pain and sinus pressure.   Discussed the use of AI scribe software for clinical note transcription with the patient, who gave verbal consent to proceed.  History of Present Illness   The patient presents with persistent bilateral ear pain, predominantly on the right side. The pain is described as sharp and stabbing, lasting for a few seconds to minutes, and then subsides. The patient reports that the symptoms returned quickly, within a day or two, after completing a course of Omnicef (prescribed on 01/22/23), which was initially thought to be helpful.   In addition to the ear pain, the patient also complains of severe sinus headaches and pressure, particularly under the eyes. There is no report of fever, chills, body aches, runny nose, coughing, or sneezing. The patient has a history of large sinuses and does not typically experience symptoms until the condition has progressed significantly.  The patient has seen an ENT specialist in the past but it has been a long time since the last visit. The patient has multiple allergies, including Augmentin, doxy, levaquin, and clindamycin. The patient is unsure about their tolerance to prednisone. The patient has Flonase at home but is not currently using it.              ROS All review of systems negative except what is listed in the HPI      Objective:    BP (!) 117/52   Pulse 68   Temp 98.1 F (36.7 C) (Oral)   Resp 18   Ht 5\' 6"  (1.676 m)   Wt 188 lb 9.6 oz (85.5 kg)   LMP 05/10/2014 (Approximate)   SpO2 98%   BMI 30.44 kg/m    Physical Exam Vitals reviewed.  Constitutional:      Appearance: Normal appearance.  HENT:      Head: Normocephalic and atraumatic.     Right Ear: A middle ear effusion is present.     Left Ear: A middle ear effusion is present.     Ears:     Comments: Right canal with mild erythema; bilateral maxillary sinuses TTP     Nose: Congestion present.     Mouth/Throat:     Mouth: Mucous membranes are moist.     Pharynx: Oropharynx is clear.  Cardiovascular:     Rate and Rhythm: Normal rate and regular rhythm.     Pulses: Normal pulses.     Heart sounds: Normal heart sounds.  Pulmonary:     Effort: Pulmonary effort is normal.     Breath sounds: Normal breath sounds.  Musculoskeletal:     Cervical back: Normal range of motion and neck supple. No tenderness.  Lymphadenopathy:     Cervical: No cervical adenopathy.  Skin:    General: Skin is warm and dry.  Neurological:     Mental Status: She is alert and oriented to person, place, and time.  Psychiatric:        Mood and Affect: Mood normal.        Behavior: Behavior normal.        Thought Content: Thought content normal.  Judgment: Judgment normal.     No results found for any visits on 02/08/23.      Assessment & Plan:   Problem List Items Addressed This Visit   None Visit Diagnoses     Acute recurrent pansinusitis    -  Primary Fluid level behind tympanic membrane of both ears  Adding Zpak. Recommended she start Flonase twice daily. Continue supportive measures including rest, hydration, humidifier use, steam showers, warm compresses to sinuses, warm liquids with lemon and honey, and over-the-counter cough, cold, and analgesics as needed.  Hesitant to try prednisone due to side effects.  If symptoms recur, recommend ENT consult.  Patient aware of signs/symptoms requiring further/urgent evaluation.     Relevant Medications   azithromycin (ZITHROMAX) 250 MG tablet             Meds ordered this encounter  Medications   azithromycin (ZITHROMAX) 250 MG tablet    Sig: Take 2 tablets on day 1, then 1 tablet  daily on days 2 through 5    Dispense:  6 tablet    Refill:  0    Order Specific Question:   Supervising Provider    Answer:   Danise Edge A [[id number]]    Return if symptoms worsen or fail to improve.  Clayborne Dana, NP

## 2023-04-28 ENCOUNTER — Encounter: Payer: Self-pay | Admitting: Family Medicine

## 2023-04-28 ENCOUNTER — Ambulatory Visit: Payer: BC Managed Care – PPO | Admitting: Family Medicine

## 2023-04-28 VITALS — BP 147/65 | HR 73 | Ht 66.0 in | Wt 188.0 lb

## 2023-04-28 DIAGNOSIS — Z6830 Body mass index (BMI) 30.0-30.9, adult: Secondary | ICD-10-CM | POA: Diagnosis not present

## 2023-04-28 DIAGNOSIS — E669 Obesity, unspecified: Secondary | ICD-10-CM | POA: Diagnosis not present

## 2023-04-28 DIAGNOSIS — I10 Essential (primary) hypertension: Secondary | ICD-10-CM | POA: Diagnosis not present

## 2023-04-28 DIAGNOSIS — E559 Vitamin D deficiency, unspecified: Secondary | ICD-10-CM

## 2023-04-28 DIAGNOSIS — Z0000 Encounter for general adult medical examination without abnormal findings: Secondary | ICD-10-CM

## 2023-04-28 LAB — CBC WITH DIFFERENTIAL/PLATELET
Basophils Absolute: 0 10*3/uL (ref 0.0–0.1)
Basophils Relative: 0.9 % (ref 0.0–3.0)
Eosinophils Absolute: 0.2 10*3/uL (ref 0.0–0.7)
Eosinophils Relative: 4.3 % (ref 0.0–5.0)
HCT: 42.6 % (ref 36.0–46.0)
Hemoglobin: 14 g/dL (ref 12.0–15.0)
Lymphocytes Relative: 31.1 % (ref 12.0–46.0)
Lymphs Abs: 1.6 10*3/uL (ref 0.7–4.0)
MCHC: 32.9 g/dL (ref 30.0–36.0)
MCV: 86.7 fl (ref 78.0–100.0)
Monocytes Absolute: 0.3 10*3/uL (ref 0.1–1.0)
Monocytes Relative: 5.7 % (ref 3.0–12.0)
Neutro Abs: 2.9 10*3/uL (ref 1.4–7.7)
Neutrophils Relative %: 58 % (ref 43.0–77.0)
Platelets: 226 10*3/uL (ref 150.0–400.0)
RBC: 4.91 Mil/uL (ref 3.87–5.11)
RDW: 13.1 % (ref 11.5–15.5)
WBC: 5 10*3/uL (ref 4.0–10.5)

## 2023-04-28 LAB — COMPREHENSIVE METABOLIC PANEL
ALT: 12 U/L (ref 0–35)
AST: 17 U/L (ref 0–37)
Albumin: 4.5 g/dL (ref 3.5–5.2)
Alkaline Phosphatase: 85 U/L (ref 39–117)
BUN: 15 mg/dL (ref 6–23)
CO2: 27 mEq/L (ref 19–32)
Calcium: 10 mg/dL (ref 8.4–10.5)
Chloride: 106 mEq/L (ref 96–112)
Creatinine, Ser: 0.85 mg/dL (ref 0.40–1.20)
GFR: 73.98 mL/min (ref >60.00)
Glucose, Bld: 89 mg/dL (ref 70–99)
Potassium: 4.4 mEq/L (ref 3.5–5.1)
Sodium: 141 mEq/L (ref 135–145)
Total Bilirubin: 1.1 mg/dL (ref 0.2–1.2)
Total Protein: 6.7 g/dL (ref 6.0–8.3)

## 2023-04-28 LAB — IBC + FERRITIN
Ferritin: 50.6 ng/mL (ref 10.0–291.0)
Iron: 124 ug/dL (ref 42–145)
Saturation Ratios: 35.3 % (ref 20.0–50.0)
TIBC: 351.4 ug/dL (ref 250.0–450.0)
Transferrin: 251 mg/dL (ref 212.0–360.0)

## 2023-04-28 LAB — TSH: TSH: 1.95 u[IU]/mL (ref 0.35–5.50)

## 2023-04-28 LAB — VITAMIN D 25 HYDROXY (VIT D DEFICIENCY, FRACTURES): VITD: 18.07 ng/mL — ABNORMAL LOW (ref 30.00–100.00)

## 2023-04-28 LAB — B12 AND FOLATE PANEL
Folate: 22 ng/mL (ref >5.9)
Vitamin B-12: 487 pg/mL (ref 211–911)

## 2023-04-28 NOTE — Progress Notes (Signed)
Established Patient Office Visit  Subjective   Patient ID: Megan May, female    DOB: 1961/01/26  Age: 62 y.o. MRN: [id number]  CC: weight management   HPI  Discussed the use of AI scribe software for clinical note transcription with the patient, who gave verbal consent to proceed.  History of Present Illness   The patient presents with concerns about vitamin intake and weight management. They express a desire to understand what vitamins they should be taking, rather than consuming them indiscriminately. They have a history of low vitamin D levels, but have discontinued supplementation due to the supplement triggering fatigue. They also express concerns about bone health due to their age and have noticed a perceived weakening of their ankles. Despite these concerns, they deny experiencing fatigue or any other symptoms.  The patient is also struggling with weight management. They have been a vegetarian for over thirty years and maintain a balanced diet, but are frustrated with their current weight. They report feeling bloated, which is somewhat alleviated by sporadic use of spironolactone. They have been advised by their cardiologist to lose weight, but have been unsuccessful despite various attempts. They have also been advised against certain weight loss medications by their gastroenterologist due to safety concerns.        Wt Readings from Last 3 Encounters:  04/28/23 188 lb (85.3 kg)  02/08/23 188 lb 9.6 oz (85.5 kg)  01/22/23 185 lb (83.9 kg)        ROS All review of systems negative except what is listed in the HPI    Objective:     BP (!) 147/65   Pulse 73   Ht 5\' 6"  (1.676 m)   Wt 188 lb (85.3 kg)   LMP 05/10/2014 (Approximate)   SpO2 99%   BMI 30.34 kg/m    Physical Exam Vitals reviewed.  Constitutional:      General: She is not in acute distress.    Appearance: Normal appearance. She is obese. She is not ill-appearing.  Cardiovascular:      Rate and Rhythm: Normal rate and regular rhythm.     Heart sounds: Normal heart sounds.  Pulmonary:     Effort: Pulmonary effort is normal.     Breath sounds: Normal breath sounds.  Musculoskeletal:     Cervical back: Normal range of motion and neck supple. No tenderness.  Lymphadenopathy:     Cervical: No cervical adenopathy.  Skin:    General: Skin is warm and dry.  Neurological:     Mental Status: She is alert and oriented to person, place, and time.  Psychiatric:        Mood and Affect: Mood normal.        Behavior: Behavior normal.        Thought Content: Thought content normal.        Judgment: Judgment normal.      No results found for any visits on 04/28/23.    The 10-year ASCVD risk score (Arnett DK, et al., 2019) is: 6.1%    Assessment & Plan:   Problem List Items Addressed This Visit       Active Problems   Essential hypertension, benign    Blood pressure is slightly elevated at today's visit. Patient reports taking lisinopril regularly. -Request patient to monitor blood pressure at home and send a list of readings in two weeks. If unable to do so, schedule a nurse visit for blood pressure check in two weeks.  Class 1 obesity with body mass index (BMI) of 30.0 to 30.9 in adult - Primary    Patient is frustrated with inability to lose weight despite a balanced diet and regular exercise. Patient has been referred to a weight loss program in the past but was not eligible due to BMI being slightly below the cutoff. -Refer to [facility] for further evaluation and management. Patient's current BMI is 30.36, which should meet the program's eligibility criteria. Patient is concerned about potential vitamin deficiencies, particularly vitamin D and B vitamins, due to vegetarian diet and previous low vitamin D levels. Patient reports fatigue with vitamin D supplementation. -Order labs to check vitamin D, B12, folate, iron, and thyroid  levels. -Consider a B complex supplement and a women's multivitamin if labs are within normal range.      Relevant Orders   CBC with Differential/Platelet   Comprehensive metabolic panel   TSH   VITAMIN D 25 Hydroxy (Vit-D Deficiency, Fractures)   Amb Ref to Medical Weight Management   B12 and Folate Panel   IBC + Ferritin   Vitamin D deficiency    Not currently on supplement (she felt like daily supplement was making her more tired) Update labs today      Relevant Orders   Comprehensive metabolic panel   VITAMIN D 25 Hydroxy (Vit-D Deficiency, Fractures)    Return in about 2 weeks (around 05/12/2023) for BP check with nurse.    Clayborne Dana, NP

## 2023-04-28 NOTE — Assessment & Plan Note (Signed)
Not currently on supplement (she felt like daily supplement was making her more tired) Update labs today

## 2023-04-28 NOTE — Assessment & Plan Note (Signed)
Blood pressure is slightly elevated at today's visit. Patient reports taking lisinopril regularly. -Request patient to monitor blood pressure at home and send a list of readings in two weeks. If unable to do so, schedule a nurse visit for blood pressure check in two weeks.

## 2023-04-28 NOTE — Assessment & Plan Note (Signed)
Patient is frustrated with inability to lose weight despite a balanced diet and regular exercise. Patient has been referred to a weight loss program in the past but was not eligible due to BMI being slightly below the cutoff. -Refer to [facility] for further evaluation and management. Patient's current BMI is 30.36, which should meet the program's eligibility criteria. Patient is concerned about potential vitamin deficiencies, particularly vitamin D and B vitamins, due to vegetarian diet and previous low vitamin D levels. Patient reports fatigue with vitamin D supplementation. -Order labs to check vitamin D, B12, folate, iron, and thyroid levels. -Consider a B complex supplement and a women's multivitamin if labs are within normal range.

## 2023-04-29 MED ORDER — VITAMIN D (ERGOCALCIFEROL) 1.25 MG (50000 UNIT) PO CAPS: 50000.0000 [IU] | ORAL_CAPSULE | ORAL | 0 refills | Status: DC

## 2023-04-29 NOTE — Addendum Note (Signed)
Addended by: Hyman Hopes B on: 04/29/2023 07:54 AM   Modules accepted: Orders

## 2023-05-18 ENCOUNTER — Ambulatory Visit (INDEPENDENT_AMBULATORY_CARE_PROVIDER_SITE_OTHER): Payer: BC Managed Care – PPO | Admitting: Family Medicine

## 2023-05-18 ENCOUNTER — Encounter: Payer: Self-pay | Admitting: Family Medicine

## 2023-05-18 VITALS — BP 157/66 | HR 58 | Temp 97.8°F | Ht 66.0 in | Wt 189.0 lb

## 2023-05-18 DIAGNOSIS — T300 Burn of unspecified body region, unspecified degree: Secondary | ICD-10-CM

## 2023-05-18 MED ORDER — BACITRACIN 500 UNIT/GM EX OINT: 1.0000 | TOPICAL_OINTMENT | Freq: Two times a day (BID) | CUTANEOUS | 2 refills | Status: DC

## 2023-05-18 NOTE — Progress Notes (Signed)
   Acute Office Visit  Subjective:     Patient ID: Megan May, female    DOB: 11/22/1960, 62 y.o.   MRN: [id number]  Chief Complaint  Patient presents with   Burn     Patient is in today for burn  Discussed the use of AI scribe software for clinical note transcription with the patient, who gave verbal consent to proceed.  History of Present Illness   The patient presents with a burn on her arm, sustained while using a curling wand approximately one week ago. Initially, the burn did not blister and was managed at home with ointment. However, the patient reports that the skin subsequently peeled off, revealing a red circle around the burn site. Over the weekend, the burn began to bleed and has been oozing a yellowish-green discharge. The patient denies any significant pain, rating it a 2 out of 10, and has been covering the wound with a non-adhesive bandage to avoid further skin damage. The patient has been applying Neosporin to the wound, but has been unable to locate her Silvadene cream. The patient denies any allergies to adhesive.           ROS All review of systems negative except what is listed in the HPI      Objective:    BP (!) 157/66   Pulse (!) 58   Temp 97.8 F (36.6 C) (Oral)   Ht 5\' 6"  (1.676 m)   Wt 189 lb (85.7 kg)   LMP 05/10/2014 (Approximate)   SpO2 100%   BMI 30.51 kg/m    Physical Exam Vitals reviewed.  Constitutional:      Appearance: Normal appearance.  Skin:    Comments: See picture of left forearm. No induration or warmth.   Neurological:     Mental Status: She is alert and oriented to person, place, and time.  Psychiatric:        Mood and Affect: Mood normal.        Behavior: Behavior normal.        Thought Content: Thought content normal.          No results found for any visits on 05/18/23.      Assessment & Plan:   Problem List Items Addressed This Visit   None Visit Diagnoses     Burn    -  Primary    Relevant Medications   bacitracin 500 UNIT/GM ointment       Burn on the arm from a curling wand. The wound is oozing yellowish-green discharge and had a ring of redness around it. No significant pain (2/10). No signs of systemic infection. -Apply Silvadene cream now and switch to Bacitracin 2 times a day -Clean the wound with warm soapy water twice a day. -Keep the wound covered. -If redness spreads around the wound, send a picture for further evaluation and possible oral antibiotics.  Patient aware of signs/symptoms requiring further/urgent evaluation.        Meds ordered this encounter  Medications   bacitracin 500 UNIT/GM ointment    Sig: Apply 1 Application topically 2 (two) times daily.    Dispense:  15 g    Refill:  2    Order Specific Question:   Supervising Provider    Answer:   Danise Edge A [[id number]]    Return if symptoms worsen or fail to improve.  Clayborne Dana, NP

## 2023-05-26 ENCOUNTER — Encounter: Payer: Self-pay | Admitting: Family Medicine

## 2023-05-26 DIAGNOSIS — H1033 Unspecified acute conjunctivitis, bilateral: Secondary | ICD-10-CM

## 2023-05-26 MED ORDER — TOBRAMYCIN 0.3 % OP SOLN: 1.0000 [drp] | OPHTHALMIC | 0 refills | Status: AC

## 2023-05-26 NOTE — Telephone Encounter (Signed)
Please see the MyChart message reply(ies) for my assessment and plan.    This patient gave consent for this Medical Advice Message and is aware that it may result in a bill to their insurance company, as well as the possibility of receiving a bill for a co-payment or deductible. They are an established patient, but are not seeking medical advice exclusively about a problem treated during an in person or video visit in the last seven days. I did not recommend an in person or video visit within seven days of my reply.    I spent a total of 5 minutes cumulative time within 7 days through MyChart messaging.  Taylor B Beck, NP   

## 2023-05-28 ENCOUNTER — Ambulatory Visit: Payer: BC Managed Care – PPO | Admitting: Physician Assistant

## 2023-05-28 ENCOUNTER — Encounter: Payer: Self-pay | Admitting: Physician Assistant

## 2023-05-28 ENCOUNTER — Encounter: Payer: Self-pay | Admitting: Family Medicine

## 2023-05-28 VITALS — BP 158/92 | HR 72 | Temp 98.3°F | Ht 66.0 in | Wt 191.0 lb

## 2023-05-28 DIAGNOSIS — J029 Acute pharyngitis, unspecified: Secondary | ICD-10-CM | POA: Diagnosis not present

## 2023-05-28 DIAGNOSIS — J020 Streptococcal pharyngitis: Secondary | ICD-10-CM | POA: Diagnosis not present

## 2023-05-28 LAB — POCT RAPID STREP A (OFFICE): Rapid Strep A Screen: POSITIVE — AB

## 2023-05-28 MED ORDER — AZITHROMYCIN 250 MG PO TABS: ORAL_TABLET | ORAL | 0 refills | Status: AC

## 2023-05-28 NOTE — Progress Notes (Signed)
Established patient visit   Patient: Megan May   DOB: 1961-06-11   62 y.o. Female  MRN: [id number] Visit Date: 05/28/2023  Today's healthcare provider: Alfredia Ferguson, PA-C   Chief Complaint  Patient presents with   Sore Throat    Patient daughter was sick last week. Symptoms started Saturday with fever. Fever was gone by Wednesday. Congested in head and its making her ears hurt. OTC- tylenol cold and flu   Subjective    Pt reports that since Saturday, x 6 days she has had nasal congestion, b/l ear pain, fever > 100 F, sore throat, cough. Taking tylenol cold and flu otc. She has not gone to work since Tuesday. Her daughter was sick last week.  Medications: Outpatient Medications Prior to Visit  Medication Sig   bacitracin 500 UNIT/GM ointment Apply 1 Application topically 2 (two) times daily.   lisinopril (ZESTRIL) 20 MG tablet Take 1 tablet (20 mg total) by mouth daily.   spironolactone (ALDACTONE) 25 MG tablet Take 25 mg by mouth daily as needed (swelling).   tobramycin (TOBREX) 0.3 % ophthalmic solution Place 1 drop into both eyes every 4 (four) hours for 7 days.   Vitamin D, Ergocalciferol, (DRISDOL) 1.25 MG (50000 UNIT) CAPS capsule Take 1 capsule (50,000 Units total) by mouth every 7 (seven) days.   No facility-administered medications prior to visit.    Review of Systems  Constitutional:  Positive for fatigue. Negative for fever.  HENT:  Positive for congestion, ear pain, postnasal drip, rhinorrhea, sinus pressure, sinus pain and sore throat.   Respiratory:  Positive for cough. Negative for shortness of breath.   Cardiovascular:  Negative for chest pain and leg swelling.  Gastrointestinal:  Negative for abdominal pain.  Neurological:  Negative for dizziness and headaches.       Objective    BP (!) 158/92 (BP Location: Right Arm, Patient Position: Sitting, Cuff Size: Normal)   Pulse 72   Temp 98.3 F (36.8 C) (Oral)   Ht 5\' 6"  (1.676 m)   Wt  191 lb (86.6 kg)   LMP 05/10/2014 (Approximate)   SpO2 97%   BMI 30.83 kg/m    Physical Exam Constitutional:      General: She is awake.     Appearance: She is well-developed.  HENT:     Head: Normocephalic.     Right Ear: Tympanic membrane normal.     Left Ear: Tympanic membrane normal.     Nose: Congestion present.     Mouth/Throat:     Pharynx: Posterior oropharyngeal erythema present.  Eyes:     Conjunctiva/sclera: Conjunctivae normal.  Cardiovascular:     Rate and Rhythm: Normal rate and regular rhythm.     Heart sounds: Normal heart sounds.  Pulmonary:     Effort: Pulmonary effort is normal.     Breath sounds: Normal breath sounds.  Skin:    General: Skin is warm.  Neurological:     Mental Status: She is alert and oriented to person, place, and time.  Psychiatric:        Attention and Perception: Attention normal.        Mood and Affect: Mood normal.        Speech: Speech normal.        Behavior: Behavior is cooperative.      Results for orders placed or performed in visit on 05/28/23  POCT rapid strep A  Result Value Ref Range   Rapid Strep A Screen  Positive (A) Negative    Assessment & Plan    Strep pharyngitis Poc positive for strep Amox allergy, rx azithromycin. Cont otc therapy, antihistamines, tylenol, hydration -     Azithromycin; Take 2 tablets on day 1, then 1 tablet daily on days 2 through 5  Dispense: 6 tablet; Refill: 0  Sore throat -     POCT rapid strep A  Return if symptoms worsen or fail to improve.       Alfredia Ferguson, PA-C  [facility] [phone] (phone) [phone] (fax)  [facility]

## 2023-06-10 ENCOUNTER — Encounter (HOSPITAL_BASED_OUTPATIENT_CLINIC_OR_DEPARTMENT_OTHER): Payer: Self-pay | Admitting: Cardiovascular Disease

## 2023-06-10 ENCOUNTER — Ambulatory Visit: Payer: BC Managed Care – PPO | Admitting: Physician Assistant

## 2023-06-10 NOTE — Telephone Encounter (Signed)
Placeholder appt scheduled on my schedule today. Upon review of symptoms I advised RN to advise patient to present to ED (freestanding ED OK) for eval given recent infectious symptoms, need for imaging/labs.

## 2023-06-10 NOTE — Telephone Encounter (Signed)
Spoke with pt.  Pt reports intermittent pain she has been having is under her left breast.  Denies current chest pain.  She denies additional symptoms of shortness of breath, dizziness, nausea, vomiting diaphoresis or radiation with the pain.  Current BP is 137/86.  She is not currently taking any OTC medications just her antibiotic. Pt advised per Ronie Spies, PA a visit to the ED or urgent care is recommended due to current symptoms and need for possible imaging and lab work.  Pt verbalizes understanding and agreeable with current plan.

## 2023-06-28 ENCOUNTER — Ambulatory Visit (INDEPENDENT_AMBULATORY_CARE_PROVIDER_SITE_OTHER): Payer: BC Managed Care – PPO | Admitting: Physician Assistant

## 2023-06-28 ENCOUNTER — Encounter (INDEPENDENT_AMBULATORY_CARE_PROVIDER_SITE_OTHER): Payer: Self-pay | Admitting: Physician Assistant

## 2023-06-28 VITALS — BP 138/84 | HR 68 | Temp 98.6°F | Ht 66.0 in | Wt 189.0 lb

## 2023-06-28 DIAGNOSIS — Z6830 Body mass index (BMI) 30.0-30.9, adult: Secondary | ICD-10-CM

## 2023-06-28 DIAGNOSIS — E559 Vitamin D deficiency, unspecified: Secondary | ICD-10-CM | POA: Diagnosis not present

## 2023-06-28 DIAGNOSIS — E66811 Obesity, class 1: Secondary | ICD-10-CM

## 2023-06-28 DIAGNOSIS — K200 Eosinophilic esophagitis: Secondary | ICD-10-CM

## 2023-06-28 DIAGNOSIS — I10 Essential (primary) hypertension: Secondary | ICD-10-CM | POA: Diagnosis not present

## 2023-06-28 DIAGNOSIS — R760 Raised antibody titer: Secondary | ICD-10-CM

## 2023-06-28 DIAGNOSIS — M25561 Pain in right knee: Secondary | ICD-10-CM

## 2023-06-28 DIAGNOSIS — G8929 Other chronic pain: Secondary | ICD-10-CM

## 2023-06-28 DIAGNOSIS — Z0289 Encounter for other administrative examinations: Secondary | ICD-10-CM

## 2023-06-28 NOTE — Progress Notes (Signed)
Office: [phone]  /  Fax: [phone]   Initial Visit  Megan May was seen in clinic today to evaluate for obesity. She is interested in losing weight to improve overall health and reduce the risk of weight related complications. She presents today to review program treatment options, initial physical assessment, and evaluation.     She was referred by: PCP  When asked what else they would like to accomplish? She states: Improve energy levels and physical activity, Improve existing medical conditions, Reduce number of medications, Improve appearance, Improve self-confidence, and Lose a target amount of weight :  Goal weight of 148 lbs  lbs in doesn't matter how long it takes- months.  Weight history: Struggled with weight gain after age 2 to 75  Done weight watchers and 56 days diet and medifast and did well but has not been able to sustain weight loss.   When asked how has your weight affected you? She states: Contributed to medical problems, Having poor endurance, Problems with eating patterns, and Has affected mood   Some associated conditions: Hypertension, Arthritis:neck, Fatty liver disease, Vitamin D Deficiency, Connective tissue disease: Lupus anticoagulant disease, and Other: esosinophilic esophagitis  Contributing factors: Family history of obesity, Consumption of processed foods, Use of obesogenic medications: Other: medication for esophagitis , Moderate to high levels of stress, Reduced physical activity, Eating patterns, Menopause, and Slow metabolism for age  Weight promoting medications identified: None  Current nutrition plan: Other: Pescatarian  Current level of physical activity: Limited due to chronic pain or orthopedic problems  Current or previous pharmacotherapy: None  Response to medication: Never tried medications   Past medical history includes:   Past Medical History:  Diagnosis Date   Anxiety    Asthma    Depression    history of     Eosinophilic esophagitis    Fatigue 07/02/2017   Generalized anxiety disorder    Hyperglycemia 03/15/2016   Hyperlipidemia, mixed 06/30/2016   Hypersomnia, recurrent    Hypertension    Migraine    Migraines    Posttraumatic stress disorder    Shingles 11/03/2021     Objective:   BP 138/84   Pulse 68   Temp 98.6 F (37 C)   Ht 5\' 6"  (1.676 m)   Wt 189 lb (85.7 kg)   LMP 05/10/2014 (Approximate)   SpO2 99%   BMI 30.51 kg/m  She was weighed on the bioimpedance scale: Body mass index is 30.51 kg/m.  Peak Weight:204 lbs , Body Fat%:41.9%, Visceral Fat Rating:11, Weight trend over the last 12 months: Unchanged  General:  Alert, oriented and cooperative. Patient is in no acute distress.  Respiratory: Normal respiratory effort, no problems with respiration noted   Gait: able to ambulate independently  Mental Status: Normal mood and affect. Normal behavior. Normal judgment and thought content.   DIAGNOSTIC DATA REVIEWED:  BMET    Component Value Date/Time   NA 141 04/28/2023 1002   NA 140 04/24/2022 0902   K 4.4 04/28/2023 1002   CL 106 04/28/2023 1002   CO2 27 04/28/2023 1002   GLUCOSE 89 04/28/2023 1002   BUN 15 04/28/2023 1002   BUN 13 04/24/2022 0902   CREATININE 0.85 04/28/2023 1002   CREATININE 0.70 11/27/2013 1008   CALCIUM 10.0 04/28/2023 1002   GFRNONAA >60 01/12/2019 1341   GFRAA >60 01/12/2019 1341   Lab Results  Component Value Date   HGBA1C 5.3 06/18/2016   HGBA1C 5.4 03/03/2016   No results found for: "  INSULIN" CBC    Component Value Date/Time   WBC 5.0 04/28/2023 1002   RBC 4.91 04/28/2023 1002   HGB 14.0 04/28/2023 1002   HGB 13.1 07/02/2017 1000   HCT 42.6 04/28/2023 1002   HCT 38.2 07/02/2017 1000   PLT 226.0 04/28/2023 1002   PLT 208 07/02/2017 1000   MCV 86.7 04/28/2023 1002   MCV 85 07/02/2017 1000   MCH 29.0 01/12/2019 1015   MCHC 32.9 04/28/2023 1002   RDW 13.1 04/28/2023 1002   RDW 13.4 07/02/2017 1000    Iron/TIBC/Ferritin/ %Sat    Component Value Date/Time   IRON 124 04/28/2023 1002   TIBC 351.4 04/28/2023 1002   FERRITIN 50.6 04/28/2023 1002   IRONPCTSAT 35.3 04/28/2023 1002   Lipid Panel     Component Value Date/Time   CHOL 210 (H) 02/10/2022 1346   TRIG 132.0 02/10/2022 1346   HDL 63.20 02/10/2022 1346   CHOLHDL 3 02/10/2022 1346   VLDL 26.4 02/10/2022 1346   LDLCALC 121 (H) 02/10/2022 1346   Hepatic Function Panel     Component Value Date/Time   PROT 6.7 04/28/2023 1002   ALBUMIN 4.5 04/28/2023 1002   AST 17 04/28/2023 1002   ALT 12 04/28/2023 1002   ALKPHOS 85 04/28/2023 1002   BILITOT 1.1 04/28/2023 1002      Component Value Date/Time   TSH 1.95 04/28/2023 1002     Assessment and Plan:   Vitamin D deficiency  Essential hypertension, benign  Eosinophilic esophagitis  Lupus anticoagulant positive  Chronic pain of right knee  Class 1 obesity due to excess calories without serious comorbidity with body mass index (BMI) of 30.0 to 30.9 in adult        Obesity Treatment / Action Plan:  Patient will work on garnering support from family and friends to begin weight loss journey. Will work on eliminating or reducing the presence of highly palatable, calorie dense foods in the home. Will complete provided nutritional and psychosocial assessment questionnaire before the next appointment. Will be scheduled for indirect calorimetry to determine resting energy expenditure in a fasting state.  This will allow [place] to create a reduced calorie, high-protein meal plan to promote loss of fat mass while preserving muscle mass. Will think about ideas on how to incorporate physical activity into their daily routine. Will avoid skipping meals which may result in increased hunger signals and overeating at certain times. Counseled on the health benefits of losing 5%-15% of total body weight. Was counseled on nutritional approaches to weight loss and benefits of reducing  processed foods and consuming plant-based foods and high quality protein as part of nutritional weight management. Was counseled on pharmacotherapy and role as an adjunct in weight management.   Obesity Education Performed Today:  She was weighed on the bioimpedance scale and results were discussed and documented in the synopsis.  We discussed obesity as a disease and the importance of a more detailed evaluation of all the factors contributing to the disease.  We discussed the importance of long term lifestyle changes which include nutrition, exercise and behavioral modifications as well as the importance of customizing this to her specific health and social needs.  We discussed the benefits of reaching a healthier weight to alleviate the symptoms of existing conditions and reduce the risks of the biomechanical, metabolic and psychological effects of obesity.  Megan May appears to be in the action stage of change and states they are ready to start intensive lifestyle modifications and behavioral  modifications.  30 minutes was spent today on this visit including the above counseling, pre-visit chart review, and post-visit documentation.  Reviewed by clinician on day of visit: allergies, medications, problem list, medical history, surgical history, family history, social history, and previous encounter notes pertinent to obesity diagnosis.   Shernell Saldierna,PA-C

## 2023-07-02 ENCOUNTER — Encounter: Payer: Self-pay | Admitting: Family Medicine

## 2023-07-05 ENCOUNTER — Encounter: Payer: Self-pay | Admitting: Medical

## 2023-07-05 ENCOUNTER — Encounter (HOSPITAL_BASED_OUTPATIENT_CLINIC_OR_DEPARTMENT_OTHER): Payer: Self-pay | Admitting: Cardiovascular Disease

## 2023-07-05 ENCOUNTER — Ambulatory Visit: Payer: BC Managed Care – PPO

## 2023-07-05 ENCOUNTER — Ambulatory Visit: Payer: BC Managed Care – PPO | Admitting: Medical

## 2023-07-05 VITALS — BP 154/75 | HR 68 | Resp 18 | Ht 66.0 in | Wt 194.0 lb

## 2023-07-05 DIAGNOSIS — R1084 Generalized abdominal pain: Secondary | ICD-10-CM

## 2023-07-05 DIAGNOSIS — R059 Cough, unspecified: Secondary | ICD-10-CM | POA: Diagnosis not present

## 2023-07-05 DIAGNOSIS — I10 Essential (primary) hypertension: Secondary | ICD-10-CM

## 2023-07-05 DIAGNOSIS — R0781 Pleurodynia: Secondary | ICD-10-CM

## 2023-07-05 DIAGNOSIS — R1012 Left upper quadrant pain: Secondary | ICD-10-CM

## 2023-07-05 MED ORDER — OMEPRAZOLE 20 MG PO CPDR: 20.0000 mg | DELAYED_RELEASE_CAPSULE | Freq: Every day | ORAL | 3 refills | Status: DC

## 2023-07-05 NOTE — Progress Notes (Signed)
Subjective:    Patient ID: Megan May, female    DOB: [birth year], 62 y.o.   MRN: [id number]  HPI  Discussed the use of AI scribe software for clinical note transcription with the patient, who gave verbal consent to proceed.  History of Present Illness   The patient, with a history of diverticulosis, hypertension, and bloating, presents with a recent onset of abdominal pain. The discomfort began on a Thursday morning, initially localized to the upper stomach area, but quickly migrated to the left rib area. The pain is described as severe, akin to a broken rib, and is exacerbated when lying on the left side, making deep breathing difficult. The patient denies any chest pain, heartburn, or reflux.  The patient also reports a feeling of bloating, describing it as feeling 'enormous.' Despite this, bowel movements have been normal, with no changes in frequency, consistency, or color. The patient's appetite has decreased since the onset of symptoms, with eating mostly occurring later in the night.  In addition to the abdominal symptoms, the patient has been experiencing a dry cough since early November. The cough was initially severe, persisting for about three to four weeks, but has since improved, though it is still present to a degree.  The patient's current medication regimen includes Lisinopril and Spironolactone for hypertension, and she has been taking over-the-counter Gas-X for the bloating. The patient denies any recent changes in medication or dosages.               Review of Systems  Constitutional:  Negative for chills, fatigue and fever.  HENT:  Negative for congestion.   Respiratory:  Positive for cough. Negative for chest tightness and wheezing.        Rib pain. Some pleuritic pain at times comes and goes. Some during end of the exam  Cardiovascular:  Negative for chest pain and palpitations.  Gastrointestinal:  Positive for abdominal distention and abdominal pain.  Negative for constipation, nausea and rectal pain.  Genitourinary:  Negative for dysuria, frequency and hematuria.  Musculoskeletal:  Negative for back pain.  Skin:  Negative for rash.  Neurological:  Negative for dizziness, speech difficulty, weakness and light-headedness.  Hematological:  Negative for adenopathy. Does not bruise/bleed easily.  Psychiatric/Behavioral:  Negative for behavioral problems and decreased concentration. The patient is not nervous/anxious.    Past Medical History:  Diagnosis Date   Anxiety    Asthma    Depression    history of    Eosinophilic esophagitis    Fatigue 07/02/2017   Generalized anxiety disorder    Hyperglycemia 03/15/2016   Hyperlipidemia, mixed 06/30/2016   Hypersomnia, recurrent    Hypertension    Migraine    Migraines    Posttraumatic stress disorder    Shingles 11/03/2021     Social History   Socioeconomic History   Marital status: Married    Spouse name: Minerva Areola   Number of children: 1   Years of education: 2 Masters   [employer] education level: Manufacturing engineer (e.g., MA, MS, MEng, MEd, MSW, MBA)  Occupational History   Occupation: SPECIAL EDUCATOR    Employer: [employer]    Comment: Administrator  Tobacco Use   Smoking status: Never   Smokeless tobacco: Never  Substance and Sexual Activity   Alcohol use: No   Drug use: No   Sexual activity: Yes    Birth control/protection: Surgical  Other Topics Concern   Not on file  Social History Narrative   Patient  is right handed, resides in home with husband and daughter. She consumes 16 oz of tea daily.   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   Social Determinants of Health   Financial Resource Strain: Low Risk  (01/22/2023)   Overall Financial Resource Strain (CARDIA)    Difficulty of Paying Living Expenses: Not hard at all  Food Insecurity: No Food Insecurity (01/22/2023)   Hunger Vital Sign    Worried About Running Out of Food in the Last Year:  Never true    Ran Out of Food in the Last Year: Never true  Transportation Needs: No Transportation Needs (01/22/2023)   PRAPARE - Administrator, Civil Service (Medical): No    Lack of Transportation (Non-Medical): No  Physical Activity: Insufficiently Active (01/22/2023)   Exercise Vital Sign    Days of Exercise per Week: 7 days    Minutes of Exercise per Session: 20 min  Stress: No Stress Concern Present (01/22/2023)   [employer] of Occupational Health - Occupational Stress Questionnaire    Feeling of Stress : Not at all  Social Connections: Moderately Isolated (01/22/2023)   Social Connection and Isolation Panel [NHANES]    Frequency of Communication with Friends and Family: More than three times a week    Frequency of Social Gatherings with Friends and Family: Once a week    Attends Religious Services: Never    Database administrator or Organizations: No    Attends Engineer, structural: Not on file    Marital Status: Married  Intimate Partner Violence: Not on file    Past Surgical History:  Procedure Laterality Date   CESAREAN SECTION  2004   ESOPHAGOGASTRODUODENOSCOPY (EGD) WITH PROPOFOL N/A 01/12/2019   Procedure: ESOPHAGOGASTRODUODENOSCOPY (EGD) WITH PROPOFOL;  Surgeon: Jeani Hawking, MD;  Location: [facility];  Service: Endoscopy;  Laterality: N/A;   FRACTURE SURGERY  1973   R arm   LAPAROTOMY Bilateral 06/14/2014   Procedure: LAPAROTOMY with right  salpingo-oophorectomy and left salpingectomy;  Surgeon: Mitchel Honour, DO;  Location: [facility];  Service: Gynecology;  Laterality: Bilateral;   TONSILLECTOMY AND ADENOIDECTOMY  1969    Family History  Problem Relation Age of Onset   Hypertension Father    Heart disease Father    Diabetes Father    CAD Father    Stroke Maternal Grandfather    Stroke Maternal Grandmother    Diabetes Paternal Grandmother    Other Cousin        bicuspid aortic valve   Diabetes Brother     Allergies  Allergen  Reactions   Amoxicillin-Pot Clavulanate Rash   Doxycycline Other (See Comments)    headache   Levaquin [Levofloxacin In D5w] Other (See Comments)    tendonopathy   Clindamycin Itching   Clindamycin/Lincomycin Itching   Lactose Intolerance (Gi) Nausea Only   Metoprolol     Weight gain/fluid retention    Penicillin G Other (See Comments)   Aspirin Other (See Comments)    Migraine headache   Latex Rash   Penicillins Swelling and Rash    Did it involve swelling of the face/tongue/throat, SOB, or low BP? No Did it involve sudden or severe rash/hives, skin peeling, or any reaction on the inside of your mouth or nose? Yes Did you need to seek medical attention at a hospital or doctor's office? No When did it last happen?      62 yo If all above answers are "NO", may proceed with cephalosporin  use.    Current Outpatient Medications on File Prior to Visit  Medication Sig Dispense Refill   lisinopril (ZESTRIL) 20 MG tablet Take 1 tablet (20 mg total) by mouth daily. 90 tablet 3   spironolactone (ALDACTONE) 25 MG tablet Take 25 mg by mouth daily as needed (swelling).     Vitamin D, Ergocalciferol, (DRISDOL) 1.25 MG (50000 UNIT) CAPS capsule Take 1 capsule (50,000 Units total) by mouth every 7 (seven) days. (Patient not taking: Reported on 06/28/2023) 8 capsule 0   No current facility-administered medications on file prior to visit.    BP (!) 154/75   Pulse 68   Resp 18   Ht 5\' 6"  (1.676 m)   Wt 194 lb (88 kg)   LMP 05/10/2014 (Approximate)   SpO2 99%   BMI 31.31 kg/m        Objective:   Physical Exam  General Mental Status- Alert. General Appearance- Not in acute distress.   Skin No rash over left lower rib area.(On deep inspiration mild left rib region pain.)  Neck Carotid Arteries- Normal color. Moisture- Normal Moisture. No carotid bruits. No JVD.  Chest and Lung Exam Auscultation: Breath Sounds:-CTA  Cardiovascular Auscultation:Rythm- rrr Murmurs & Other Heart  Sounds:Auscultation of the heart reveals- No Murmurs.  Abdomen Inspection:-Inspeection Normal. Palpation/Percussion:Note:No mass. Palpation and Percussion of the abdomen reveal- Non Tender, Non Distended + BS, no rebound or guarding.   Neurologic Cranial Nerve exam:- CN III-XII intact(No nystagmus), symmetric smile. Drift Test:- No drift. Romberg Exam:- Negative.  Finger to Nose:- Normal/Intact Strength:- 5/5 equal and symmetric strength both upper and lower extremities.       Assessment & Plan:   Assessment and Plan    Abdominal Pain Epigastric and left lower rib pain since 07/01/2023. Pain is worse when lying on the left side. No associated nausea, vomiting, diarrhea, constipation, or fever. History of diverticulosis by colonoscopy review. -Order metabolic panel, CBC, H. pylori breath test, and two-view abdomen x-ray.(assess stool volume) -Start Omeprazole 20mg  daily for possible gastritis. -Consider CT abdomen depending on results of initial workup. Particularly if more generalized abdomen pain or if become worse and localized.  Hypertension(normal neurologic exam) Elevated blood pressure at today's visit. Currently on Lisinopril 20mg  and Spironolactone 25mg  daily. -Advise patient to monitor blood pressure at home and send results via MyChart. -If blood pressure remains above 140/90, increase Lisinopril to 40mg  daily.  Chronic Cough Persistent dry cough since early October 2024, improved but still present. -Order chest x-ray with left rib view to evaluate for possible lung pathology.   Follow up date to be determined after lab and imaging review.        Esperanza Richters, PA-C    Time spent with patient today was  40 minutes which consisted of chart revdiew, discussing diagnosis, work up treatment and documentation.

## 2023-07-05 NOTE — Patient Instructions (Signed)
Abdominal Pain Epigastric and left lower rib pain since 07/01/2023. Pain is worse when lying on the left side. No associated nausea, vomiting, diarrhea, constipation, or fever. History of diverticulosis by colonoscopy review. -Order metabolic panel, CBC, H. pylori breath test, and two-view abdomen x-ray.(assess stool volume) -Start Omeprazole 20mg  daily for possible gastritis. -Consider CT abdomen depending on results of initial workup. Particularly if more generalized abdomen pain or if become worse and localized.  Hypertension(normal neurologic exam) Elevated blood pressure at today's visit. Currently on Lisinopril 20mg  and Spironolactone 25mg  daily. -Advise patient to monitor blood pressure at home and send results via MyChart. -If blood pressure remains above 140/90, increase Lisinopril to 40mg  daily.  Chronic Cough Persistent dry cough since early October 2024, improved but still present. -Order chest x-ray with left rib view to evaluate for possible lung pathology.   Follow up date to be determined after lab and imaging review.

## 2023-07-05 NOTE — Telephone Encounter (Signed)
Chest pain atypical for angina. Has OV with PCP today. No further workup required.   Alver Sorrow, NP

## 2023-07-06 LAB — CBC WITH DIFFERENTIAL/PLATELET
Basophils Absolute: 0.1 10*3/uL (ref 0.0–0.1)
Basophils Relative: 0.9 % (ref 0.0–3.0)
Eosinophils Absolute: 0.4 10*3/uL (ref 0.0–0.7)
Eosinophils Relative: 7.3 % — ABNORMAL HIGH (ref 0.0–5.0)
HCT: 38.8 % (ref 36.0–46.0)
Hemoglobin: 13.2 g/dL (ref 12.0–15.0)
Lymphocytes Relative: 25.9 % (ref 12.0–46.0)
Lymphs Abs: 1.6 10*3/uL (ref 0.7–4.0)
MCHC: 34 g/dL (ref 30.0–36.0)
MCV: 86.6 fL (ref 78.0–100.0)
Monocytes Absolute: 0.3 10*3/uL (ref 0.1–1.0)
Monocytes Relative: 5 % (ref 3.0–12.0)
Neutro Abs: 3.7 10*3/uL (ref 1.4–7.7)
Neutrophils Relative %: 60.9 % (ref 43.0–77.0)
Platelets: 240 10*3/uL (ref 150.0–400.0)
RBC: 4.48 Mil/uL (ref 3.87–5.11)
RDW: 13.2 % (ref 11.5–15.5)
WBC: 6.1 10*3/uL (ref 4.0–10.5)

## 2023-07-06 LAB — COMPREHENSIVE METABOLIC PANEL
ALT: 14 U/L (ref 0–35)
AST: 15 U/L (ref 0–37)
Albumin: 4.4 g/dL (ref 3.5–5.2)
Alkaline Phosphatase: 82 U/L (ref 39–117)
BUN: 12 mg/dL (ref 6–23)
CO2: 28 meq/L (ref 19–32)
Calcium: 9.9 mg/dL (ref 8.4–10.5)
Chloride: 105 meq/L (ref 96–112)
Creatinine, Ser: 0.71 mg/dL (ref 0.40–1.20)
GFR: 91.69 mL/min (ref >60.00)
Glucose, Bld: 152 mg/dL — ABNORMAL HIGH (ref 70–99)
Potassium: 3.6 meq/L (ref 3.5–5.1)
Sodium: 141 meq/L (ref 135–145)
Total Bilirubin: 0.9 mg/dL (ref 0.2–1.2)
Total Protein: 6.7 g/dL (ref 6.0–8.3)

## 2023-07-06 LAB — LIPASE: Lipase: 19 U/L (ref 11.0–59.0)

## 2023-07-06 MED ORDER — HYOSCYAMINE SULFATE ER 0.375 MG PO TB12: 0.3750 mg | ORAL_TABLET | Freq: Two times a day (BID) | ORAL | 0 refills | Status: DC

## 2023-07-06 NOTE — Addendum Note (Signed)
Addended by: Gwenevere Abbot on: 07/06/2023 05:12 PM   Modules accepted: Orders

## 2023-07-07 LAB — H. PYLORI BREATH TEST: H. pylori Breath Test: NOT DETECTED

## 2023-07-07 NOTE — Addendum Note (Signed)
Addended by: Gwenevere Abbot on: 07/07/2023 07:43 PM   Modules accepted: Orders

## 2023-07-08 ENCOUNTER — Telehealth (HOSPITAL_BASED_OUTPATIENT_CLINIC_OR_DEPARTMENT_OTHER): Payer: Self-pay

## 2023-07-16 ENCOUNTER — Telehealth (HOSPITAL_BASED_OUTPATIENT_CLINIC_OR_DEPARTMENT_OTHER): Payer: Self-pay

## 2023-08-06 ENCOUNTER — Encounter: Payer: Self-pay | Admitting: Medical

## 2023-08-06 ENCOUNTER — Ambulatory Visit: Payer: 59

## 2023-08-06 DIAGNOSIS — R1084 Generalized abdominal pain: Secondary | ICD-10-CM

## 2023-08-06 DIAGNOSIS — R1012 Left upper quadrant pain: Secondary | ICD-10-CM

## 2023-08-06 NOTE — Telephone Encounter (Signed)
 Once reviewed by Edward Saguier,PA-C , will fax over to patient's GI   Copied from CRM [id number]. Topic: General - Other >> Aug 06, 2023  3:57 PM Pinkey ORN wrote: Reason for CRM: Lab Results >> Aug 06, 2023  3:59 PM Pinkey ORN wrote: Patient states she received upsetting results in regards to her labs (abdominal ultrasound) and wants to know how she could have them faxed over to her gastrologist. [facility] [phone] Dr. Kristie

## 2023-08-09 NOTE — Telephone Encounter (Signed)
 faxed

## 2023-08-09 NOTE — Telephone Encounter (Addendum)
 Dr. Kristie,  A mutual patient of ours wanted us  to send over copy her US  report. She is having some  epigastric and atypical luq region pain. I decided to send you copy of the report. She may be wanting to see you in the near future regarding findings.   Thanks,  Dallas Maxwell, PA-C    CLINICAL DATA:  Epigastric and left lower abdominal pain since November 2024. History of diverticulosis.   EXAM: ABDOMEN ULTRASOUND COMPLETE   COMPARISON:  Abdominal x-ray 07/05/2023. ultrasound 2017.   FINDINGS: Gallbladder: No gallstones or wall thickening visualized. No sonographic Murphy sign noted by sonographer.   Common bile duct: Diameter: 5 mm   Liver: Multiple anechoic structures in the liver with through transmission consistent with cysts. Largest measures 17 mm. Previously these are seen measuring up to 18 mm. Portal vein is patent on color Doppler imaging with normal direction of blood flow towards the liver.   IVC: No abnormality visualized.   Pancreas: Visualized portion unremarkable.   Spleen: Enlarged at 14.5 cm in length.  Small splenule.   Right Kidney: Length: 10.0 cm. Echogenicity within normal limits. No mass or hydronephrosis visualized.   Left Kidney: Length: 11.6 cm. Echogenicity within normal limits. No mass or hydronephrosis visualized. Prominent column of Bertin.   Abdominal aorta: No aneurysm visualized.   Other findings: None.   IMPRESSION: No gallstones or ductal dilatation. Mildly enlarged spleen at 14.5 cm.   Few scattered hepatic cystic foci.

## 2023-08-11 ENCOUNTER — Encounter (INDEPENDENT_AMBULATORY_CARE_PROVIDER_SITE_OTHER): Payer: Self-pay | Admitting: Family Medicine

## 2023-08-11 ENCOUNTER — Ambulatory Visit (INDEPENDENT_AMBULATORY_CARE_PROVIDER_SITE_OTHER): Payer: 59 | Admitting: Family Medicine

## 2023-08-11 VITALS — BP 150/86 | HR 79 | Temp 98.2°F | Ht 66.0 in | Wt 190.0 lb

## 2023-08-11 DIAGNOSIS — Z6830 Body mass index (BMI) 30.0-30.9, adult: Secondary | ICD-10-CM

## 2023-08-11 DIAGNOSIS — Z1331 Encounter for screening for depression: Secondary | ICD-10-CM

## 2023-08-11 DIAGNOSIS — I10 Essential (primary) hypertension: Secondary | ICD-10-CM | POA: Diagnosis not present

## 2023-08-11 DIAGNOSIS — R0602 Shortness of breath: Secondary | ICD-10-CM | POA: Diagnosis not present

## 2023-08-11 DIAGNOSIS — E559 Vitamin D deficiency, unspecified: Secondary | ICD-10-CM

## 2023-08-11 DIAGNOSIS — E782 Mixed hyperlipidemia: Secondary | ICD-10-CM | POA: Diagnosis not present

## 2023-08-11 DIAGNOSIS — R5383 Other fatigue: Secondary | ICD-10-CM

## 2023-08-11 DIAGNOSIS — R739 Hyperglycemia, unspecified: Secondary | ICD-10-CM

## 2023-08-11 DIAGNOSIS — E66811 Obesity, class 1: Secondary | ICD-10-CM

## 2023-08-11 NOTE — Assessment & Plan Note (Signed)
 On daily OTC Vitamin D.  Previously low Vitamin D at 20.  Vitamin D level ordered today.

## 2023-08-11 NOTE — Assessment & Plan Note (Signed)
 Patient has had a history of intermittent elevations of both her triglycerides and LDL.  FLP ordered today.

## 2023-08-11 NOTE — Progress Notes (Signed)
 Chief Complaint:  Obesity   Subjective:  Megan May (MR# [id number]) is a 63 y.o. female who presents for evaluation and treatment of obesity and related comorbidities.   Megan May is currently in the action stage of change and ready to dedicate time achieving and maintaining a healthier weight. Megan May is interested in becoming our patient and working on intensive lifestyle modifications including (but not limited to) diet and exercise for weight loss.  Megan May has been struggling with her weight. She has been unsuccessful in either losing weight, maintaining weight loss, or reaching her healthy weight goal.  Patient referred by PCP.  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  Works 37.5 hours a week at work and 8 hours a week at home.  Lives at home with husband and daughter.  Everyone is supportive of her but they do not eat meals together and no one is going to change how they eat.   She eats pescatarian for over 30 years. Desired weight is under 150lbs- unsure when the last time she was that weight. Previously tried weight watchers, Medifast and 56 days as well as DASH. She isn't eating out but doesn't like to cook.  She doesn't eat much in terms of processed food or soy. She eats only fish- no poultry.   Food recall: Likely skips BF.  May do half a bagel with PB2 (satisfied) or a Rx bar (satisfied) or 2 pieces of ezekiel toast with butter (satisfied).  She occasionally does 2 scrambled eggs, with 0.25 cup nonfat cheese and quinoa (satisfied).  Will drink water or unsweet tea.  Will eat again at 11:30am quinoa (1/2 cup) and black beans (*1/2 can) or salmon and spinach (1 filet) (feels satisfied from either option).  Eats again around 2:30pm and she is hungry at that time.  May have carrots and hummus or a small bag of microwave popcorn. Dinner is similar to lunch.   Indirect Calorimeter completed today shows a 1613. Her calculated basal metabolic rate is 8467 thus her basal metabolic rate is  better than expected.  Other Fatigue Megan May admits to daytime somnolence and admits to waking up still tired. Patient has a history of symptoms of daytime fatigue. Megan May generally gets 6 -8 hours of sleep per night, and states that she has difficulty falling asleep. Snoring is not present. Apneic episodes are not present. Epworth Sleepiness Score is 1.   Shortness of Breath Megan May notes increasing shortness of breath with exercising and seems to be worsening over time with weight gain. She notes getting out of breath sooner with activity than she used to. This has gotten worse recently. Megan May denies shortness of breath at rest or orthopnea.  Depression Screen Megan May's Food and Mood (modified PHQ-9) score was 7.     05/28/2023    9:47 AM  Depression screen PHQ 2/9  Decreased Interest 1  Down, Depressed, Hopeless 1  PHQ - 2 Score 2  Altered sleeping 2  Tired, decreased energy 2  Change in appetite 1  Feeling bad or failure about yourself  2  Trouble concentrating 1  Moving slowly or fidgety/restless 1  Suicidal thoughts 0  PHQ-9 Score 11  Difficult doing work/chores Somewhat difficult     Objective:  No data recorded  No data recorded  No data recorded  No data recorded   EKG: Normal sinus rhythm, rate 66.  General: Cooperative, alert, well developed, in no acute distress. HEENT: Conjunctivae and lids unremarkable. Cardiovascular: Regular rhythm.  Lungs:  Normal work of breathing. Neurologic: No focal deficits.   Lab Results  Component Value Date   CREATININE 0.94 08/11/2023   BUN 13 08/11/2023   NA 141 08/11/2023   K 4.2 08/11/2023   CL 103 08/11/2023   CO2 23 08/11/2023   Lab Results  Component Value Date   ALT 20 08/11/2023   AST 23 08/11/2023   ALKPHOS 93 08/11/2023   BILITOT 0.9 08/11/2023   Lab Results  Component Value Date   HGBA1C 5.4 08/11/2023   HGBA1C 5.3 06/18/2016   HGBA1C 5.4 03/03/2016   Lab Results  Component Value Date   INSULIN  12.2  08/11/2023   Lab Results  Component Value Date   TSH 3.010 08/11/2023   Lab Results  Component Value Date   CHOL 244 (H) 08/11/2023   HDL 76 08/11/2023   LDLCALC 153 (H) 08/11/2023   TRIG 88 08/11/2023   CHOLHDL 3 02/10/2022   Lab Results  Component Value Date   WBC 7.0 08/11/2023   HGB 14.6 08/11/2023   HCT 45.0 08/11/2023   MCV 88 08/11/2023   PLT 250 08/11/2023   Lab Results  Component Value Date   IRON 124 04/28/2023   TIBC 351.4 04/28/2023   FERRITIN 50.6 04/28/2023    Assessment and Plan:   Other Fatigue  Megan May does feel that her weight is causing her energy to be lower than it should be. Fatigue may be related to obesity, depression or many other causes. Labs will be ordered, and in the meanwhile, Megan May will focus on self care including making healthy food choices, increasing physical activity and focusing on stress reduction.  Shortness of Breath  Megan May does feel that she gets out of breath more easily that she used to when she exercises. 's shortness of breath appears to be obesity related and exercise induced. She has agreed to work on weight loss and gradually increase exercise to treat her exercise induced shortness of breath. Will continue to monitor closely.  Megan May had a positive depression screening. Depression is commonly associated with obesity and often results in emotional eating behaviors. We will monitor this closely and work on CBT to help improve the non-hunger eating patterns. Referral to Psychology may be required if no improvement is seen as she continues in our clinic.    Problem List Items Addressed This Visit       Cardiovascular and Mediastinum   Essential hypertension, benign   Diagnosed over a decade ago.  She was previously on a different medication but is on lisinopril  20mg  currently.  No chest pain, chest pressure or headache. CMP today.      Relevant Orders   Comprehensive metabolic panel (Completed)     Other   Hyperglycemia    Intermittent elevations of blood sugar but unsure if all labs were done fasting.  A1c and Insulin  level today.      Relevant Orders   Hemoglobin A1c (Completed)   Insulin , random (Completed)   Hyperlipidemia, mixed   Patient has had a history of intermittent elevations of both her triglycerides and LDL.  FLP ordered today.       Relevant Orders   Lipid Panel With LDL/HDL Ratio (Completed)   Fatigue - Primary   Relevant Orders   EKG 12-Lead (Completed)   Vitamin B12 (Completed)   T4, free (Completed)   T3 (Completed)   Folate (Completed)   TSH (Completed)   Class 1 obesity with body mass index (BMI) of 30.0 to 30.9 in adult  Vitamin D  deficiency   On daily OTC Vitamin D .  Previously low Vitamin D  at 20.  Vitamin D  level ordered today.       Relevant Orders   VITAMIN D  25 Hydroxy (Vit-D Deficiency, Fractures) (Completed)   Other Visit Diagnoses       SOBOE (shortness of breath on exertion)       Relevant Orders   CBC with Differential/Platelet (Completed)     BMI 30.0-30.9,adult           Megan May is currently in the action stage of change and her goal is to continue with weight loss efforts. I recommend Megan May begin the structured treatment plan as follows:  She has agreed to [employer]  Exercise goals: No exercise has been prescribed at this time.  Behavioral modification strategies:increasing lean protein intake, increasing vegetables, no skipping meals, and meal planning and cooking strategies  She was informed of the importance of frequent follow-up visits to maximize her success with intensive lifestyle modifications for her multiple health conditions. She was informed we would discuss her lab results at her next visit unless there is a critical issue that needs to be addressed sooner. Megan May agreed to keep her next visit at the agreed upon time to discuss these results.   Attestation Statements:  Reviewed by clinician on day of visit: allergies,  medications, problem list, medical history, surgical history, family history, social history, and previous encounter notes.  Time spent on visit including pre-visit chart review and post-visit charting and care was 45 minutes.   Megan May Cho, MD

## 2023-08-11 NOTE — Assessment & Plan Note (Signed)
 Diagnosed over a decade ago.  She was previously on a different medication but is on lisinopril 20mg  currently.  No chest pain, chest pressure or headache. CMP today.

## 2023-08-11 NOTE — Assessment & Plan Note (Addendum)
Intermittent elevations of blood sugar but unsure if all labs were done fasting.  A1c and Insulin level today.

## 2023-08-12 LAB — CBC WITH DIFFERENTIAL/PLATELET
Basophils Absolute: 0.1 10*3/uL (ref 0.0–0.2)
Basos: 1 %
EOS (ABSOLUTE): 0.4 10*3/uL (ref 0.0–0.4)
Eos: 6 %
Hematocrit: 45 % (ref 34.0–46.6)
Hemoglobin: 14.6 g/dL (ref 11.1–15.9)
Immature Grans (Abs): 0 10*3/uL (ref 0.0–0.1)
Immature Granulocytes: 0 %
Lymphocytes Absolute: 1.6 10*3/uL (ref 0.7–3.1)
Lymphs: 23 %
MCH: 28.5 pg (ref 26.6–33.0)
MCHC: 32.4 g/dL (ref 31.5–35.7)
MCV: 88 fL (ref 79–97)
Monocytes Absolute: 0.4 10*3/uL (ref 0.1–0.9)
Monocytes: 6 %
Neutrophils Absolute: 4.5 10*3/uL (ref 1.4–7.0)
Neutrophils: 64 %
Platelets: 250 10*3/uL (ref 150–450)
RBC: 5.12 x10E6/uL (ref 3.77–5.28)
RDW: 12.6 % (ref 11.7–15.4)
WBC: 7 10*3/uL (ref 3.4–10.8)

## 2023-08-12 LAB — LIPID PANEL WITH LDL/HDL RATIO
Cholesterol, Total: 244 mg/dL — ABNORMAL HIGH (ref 100–199)
HDL: 76 mg/dL (ref >39)
LDL Chol Calc (NIH): 153 mg/dL — ABNORMAL HIGH (ref 0–99)
LDL/HDL Ratio: 2 {ratio} (ref 0.0–3.2)
Triglycerides: 88 mg/dL (ref 0–149)
VLDL Cholesterol Cal: 15 mg/dL (ref 5–40)

## 2023-08-12 LAB — COMPREHENSIVE METABOLIC PANEL
ALT: 20 [IU]/L (ref 0–32)
AST: 23 [IU]/L (ref 0–40)
Albumin: 4.8 g/dL (ref 3.9–4.9)
Alkaline Phosphatase: 93 [IU]/L (ref 44–121)
BUN/Creatinine Ratio: 14 (ref 12–28)
BUN: 13 mg/dL (ref 8–27)
Bilirubin Total: 0.9 mg/dL (ref 0.0–1.2)
CO2: 23 mmol/L (ref 20–29)
Calcium: 10 mg/dL (ref 8.7–10.3)
Chloride: 103 mmol/L (ref 96–106)
Creatinine, Ser: 0.94 mg/dL (ref 0.57–1.00)
Globulin, Total: 2.3 g/dL (ref 1.5–4.5)
Glucose: 94 mg/dL (ref 70–99)
Potassium: 4.2 mmol/L (ref 3.5–5.2)
Sodium: 141 mmol/L (ref 134–144)
Total Protein: 7.1 g/dL (ref 6.0–8.5)
eGFR: 69 mL/min/{1.73_m2} (ref >59)

## 2023-08-12 LAB — FOLATE: Folate: 17.2 ng/mL (ref >3.0)

## 2023-08-12 LAB — T3: T3, Total: 144 ng/dL (ref 71–180)

## 2023-08-12 LAB — VITAMIN B12: Vitamin B-12: 569 pg/mL (ref 232–1245)

## 2023-08-12 LAB — T4, FREE: Free T4: 1.33 ng/dL (ref 0.82–1.77)

## 2023-08-12 LAB — INSULIN, RANDOM: INSULIN: 12.2 u[IU]/mL (ref 2.6–24.9)

## 2023-08-12 LAB — TSH: TSH: 3.01 u[IU]/mL (ref 0.450–4.500)

## 2023-08-12 LAB — HEMOGLOBIN A1C
Est. average glucose Bld gHb Est-mCnc: 108 mg/dL
Hgb A1c MFr Bld: 5.4 % (ref 4.8–5.6)

## 2023-08-12 LAB — VITAMIN D 25 HYDROXY (VIT D DEFICIENCY, FRACTURES): Vit D, 25-Hydroxy: 22.5 ng/mL — ABNORMAL LOW (ref 30.0–100.0)

## 2023-08-24 ENCOUNTER — Ambulatory Visit (INDEPENDENT_AMBULATORY_CARE_PROVIDER_SITE_OTHER): Payer: 59 | Admitting: Family Medicine

## 2023-08-24 ENCOUNTER — Encounter (INDEPENDENT_AMBULATORY_CARE_PROVIDER_SITE_OTHER): Payer: Self-pay | Admitting: Family Medicine

## 2023-08-24 VITALS — BP 132/77 | HR 64 | Temp 98.2°F | Ht 66.0 in | Wt 187.0 lb

## 2023-08-24 DIAGNOSIS — E88819 Insulin resistance, unspecified: Secondary | ICD-10-CM | POA: Diagnosis not present

## 2023-08-24 DIAGNOSIS — E559 Vitamin D deficiency, unspecified: Secondary | ICD-10-CM

## 2023-08-24 DIAGNOSIS — Z6830 Body mass index (BMI) 30.0-30.9, adult: Secondary | ICD-10-CM

## 2023-08-24 DIAGNOSIS — E782 Mixed hyperlipidemia: Secondary | ICD-10-CM | POA: Diagnosis not present

## 2023-08-24 DIAGNOSIS — E66811 Obesity, class 1: Secondary | ICD-10-CM

## 2023-08-24 NOTE — Progress Notes (Signed)
SUBJECTIVE:  Chief Complaint: Obesity  Interim History: Patient here for first follow up.  She was hungry all the time.  Only meal that felt sufficient was the 4th option at lunch with the flat out flatbread and the 2nd option of the salad. Wondering if she can substitute vegetarian sausage links, salmon creation packets into plan. She asked questions about juice implementation.  Wondering if she lost enough. Plans to go to [employer] to celebrate her daughter's birthday in the upcoming few weeks.    Megan May is here to discuss her progress with her obesity treatment plan. She is on the Pescatarian Plan + 300 and states she is following her eating plan approximately 95 % of the time. She states she is not exercising.   OBJECTIVE: Visit Diagnoses: Problem List Items Addressed This Visit       Endocrine   Insulin resistance   Pathophysiology of progression through insulin resistance to prediabetes and diabetes was discussed at length today.  Patient to continue to monitor and be in control of total intake of snack calories which may be simple carbohydrates but should be consumed only after the patient has taken in all the nutrition for the day.  Macronutrient identification, classification and daily intake ratios were discussed.  Plan to repeat labs in 3 months to monitor both hemoglobin A1c and insulin levels.  No medications at this time as patient is not having significant hunger or cravings that would make following meal plan more difficult.           Other   Hyperlipidemia, mixed - Primary   The 10-year ASCVD risk score (Arnett DK, et al., 2019) is: 4.9%   Values used to calculate the score:     Age: 63 years     Sex: Female     Is Non-Hispanic African American: No     Diabetic: No     Tobacco smoker: No     Systolic Blood Pressure: 132 mmHg     Is BP treated: Yes     HDL Cholesterol: 76 mg/dL     Total Cholesterol: 244 mg/dL       Class 1 obesity with body mass index (BMI)  of 30.0 to 30.9 in adult   Patient did well following pescatarian meal plan with an additional 300 cal for snacks.  She did voice that she had fairly consistent hunger throughout the day.  She only found 2 options on the meal plan to be satisfying after her eating to account for her meal.  With the exception of substituting veggie sausage links and salmon creations for the [place] sausage links and the tuna packets she had no other request for changing her meal plan.  We discussed the importance of ensuring adequate protein consumption and ultimately making a transition off this meal plan to a more realistic food intake for her when she has become aware of the nutritional requirement her body needs.      Vitamin D deficiency   Discussed importance of vitamin d supplementation.  Vitamin d supplementation has been shown to decrease fatigue, decrease risk of progression to insulin resistance and then prediabetes, decreases risk of falling in older age and can even assist in decreasing depressive symptoms in PTSD.  Patient to continue OTC Vitamin D- she accidentally took RX daily for 7 days so doing 2k international units a day currently.       Other Visit Diagnoses       BMI 30.0-30.9,adult  Vitals Temp: 98.2 F (36.8 C) BP: 132/77 Pulse Rate: 64 SpO2: 100 %   Anthropometric Measurements Height: 5\' 6"  (1.676 m) Weight: 187 lb (84.8 kg) BMI (Calculated): 30.2 Weight at Last Visit: 190 lb Weight Lost Since Last Visit: 3 lb Weight Gained Since Last Visit: 0 Starting Weight: 190 lb Total Weight Loss (lbs): 3 lb (1.361 kg) Peak Weight: 204 lb   Body Composition  Body Fat %: 40.3 % Fat Mass (lbs): 75.6 lbs Muscle Mass (lbs): 106.4 lbs Total Body Water (lbs): 71.6 lbs Visceral Fat Rating : 10   Other Clinical Data Today's Visit #: 2 Starting Date: 08/11/23     ASSESSMENT AND PLAN:  Diet: Megan May is currently in the action stage of change. As such, her goal is to  continue with weight loss efforts. She has agreed to [employer] + 300.  Exercise: Megan May has been instructed  be active per her own interest and desire but nothing structured  for weight loss and overall health benefits.   Behavior Modification:  We discussed the following Behavioral Modification Strategies today: increasing lean protein intake, increasing vegetables, meal planning and cooking strategies, keeping healthy foods in the home, better snacking choices, and planning for success.   No follow-ups on file.Marland Kitchen She was informed of the importance of frequent follow up visits to maximize her success with intensive lifestyle modifications for her multiple health conditions.  Attestation Statements:   Reviewed by clinician on day of visit: allergies, medications, problem list, medical history, surgical history, family history, social history, and previous encounter notes.      [name], MD

## 2023-08-24 NOTE — Assessment & Plan Note (Signed)
Patient did well following pescatarian meal plan with an additional 300 cal for snacks.  She did voice that she had fairly consistent hunger throughout the day.  She only found 2 options on the meal plan to be satisfying after her eating to account for her meal.  With the exception of substituting veggie sausage links and salmon creations for the [place] sausage links and the tuna packets she had no other request for changing her meal plan.  We discussed the importance of ensuring adequate protein consumption and ultimately making a transition off this meal plan to a more realistic food intake for her when she has become aware of the nutritional requirement her body needs.

## 2023-08-24 NOTE — Assessment & Plan Note (Signed)

## 2023-08-24 NOTE — Assessment & Plan Note (Signed)
Discussed importance of vitamin d supplementation.  Vitamin d supplementation has been shown to decrease fatigue, decrease risk of progression to insulin resistance and then prediabetes, decreases risk of falling in older age and can even assist in decreasing depressive symptoms in PTSD.  Patient to continue OTC Vitamin D- she accidentally took RX daily for 7 days so doing 2k international units a day currently.

## 2023-08-24 NOTE — Assessment & Plan Note (Signed)
The 10-year ASCVD risk score (Arnett DK, et al., 2019) is: 4.9%   Values used to calculate the score:     Age: 63 years     Sex: Female     Is Non-Hispanic African American: No     Diabetic: No     Tobacco smoker: No     Systolic Blood Pressure: 132 mmHg     Is BP treated: Yes     HDL Cholesterol: 76 mg/dL     Total Cholesterol: 244 mg/dL

## 2023-08-28 ENCOUNTER — Encounter: Payer: Self-pay | Admitting: Family Medicine

## 2023-08-30 ENCOUNTER — Other Ambulatory Visit: Payer: Self-pay | Admitting: Family Medicine

## 2023-08-30 DIAGNOSIS — R161 Splenomegaly, not elsewhere classified: Secondary | ICD-10-CM

## 2023-08-31 NOTE — Telephone Encounter (Signed)
Called patient. Instructions given per provider's note. An appointment was made for 01/31/24

## 2023-09-07 ENCOUNTER — Ambulatory Visit (INDEPENDENT_AMBULATORY_CARE_PROVIDER_SITE_OTHER): Payer: 59 | Admitting: Family Medicine

## 2023-09-20 ENCOUNTER — Encounter (INDEPENDENT_AMBULATORY_CARE_PROVIDER_SITE_OTHER): Payer: Self-pay | Admitting: Physician Assistant

## 2023-09-20 ENCOUNTER — Ambulatory Visit (INDEPENDENT_AMBULATORY_CARE_PROVIDER_SITE_OTHER): Payer: 59 | Admitting: Physician Assistant

## 2023-09-20 VITALS — BP 148/80 | HR 66 | Temp 98.4°F | Ht 66.0 in | Wt 184.0 lb

## 2023-09-20 DIAGNOSIS — E782 Mixed hyperlipidemia: Secondary | ICD-10-CM | POA: Diagnosis not present

## 2023-09-20 DIAGNOSIS — I10 Essential (primary) hypertension: Secondary | ICD-10-CM

## 2023-09-20 DIAGNOSIS — E88819 Insulin resistance, unspecified: Secondary | ICD-10-CM | POA: Diagnosis not present

## 2023-09-20 DIAGNOSIS — E669 Obesity, unspecified: Secondary | ICD-10-CM

## 2023-09-20 DIAGNOSIS — M79602 Pain in left arm: Secondary | ICD-10-CM

## 2023-09-20 DIAGNOSIS — Z6829 Body mass index (BMI) 29.0-29.9, adult: Secondary | ICD-10-CM

## 2023-09-20 DIAGNOSIS — E559 Vitamin D deficiency, unspecified: Secondary | ICD-10-CM
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
## 2023-09-24 ENCOUNTER — Encounter: Payer: Self-pay | Admitting: Family

## 2023-09-24 ENCOUNTER — Inpatient Hospital Stay: Payer: 59 | Admitting: Family

## 2023-09-24 ENCOUNTER — Inpatient Hospital Stay: Payer: 59 | Attending: Hematology & Oncology

## 2023-09-24 ENCOUNTER — Other Ambulatory Visit: Payer: Self-pay | Admitting: Family

## 2023-09-24 VITALS — BP 146/73 | HR 59 | Temp 98.3°F | Resp 17 | Ht 66.0 in | Wt 188.0 lb

## 2023-09-24 DIAGNOSIS — K209 Esophagitis, unspecified without bleeding: Secondary | ICD-10-CM | POA: Diagnosis not present

## 2023-09-24 DIAGNOSIS — R140 Abdominal distension (gaseous): Secondary | ICD-10-CM

## 2023-09-24 DIAGNOSIS — R1012 Left upper quadrant pain: Secondary | ICD-10-CM | POA: Insufficient documentation

## 2023-09-24 DIAGNOSIS — R161 Splenomegaly, not elsewhere classified: Secondary | ICD-10-CM | POA: Diagnosis present

## 2023-09-24 LAB — CMP (CANCER CENTER ONLY)
ALT: 10 U/L (ref 0–44)
AST: 15 U/L (ref 15–41)
Albumin: 4.5 g/dL (ref 3.5–5.0)
Alkaline Phosphatase: 68 U/L (ref 38–126)
Anion gap: 7 (ref 5–15)
BUN: 22 mg/dL (ref 8–23)
CO2: 29 mmol/L (ref 22–32)
Calcium: 10.1 mg/dL (ref 8.9–10.3)
Chloride: 107 mmol/L (ref 98–111)
Creatinine: 0.83 mg/dL (ref 0.44–1.00)
GFR, Estimated: >60 mL/min (ref >60)
Glucose, Bld: 96 mg/dL (ref 70–99)
Potassium: 4.3 mmol/L (ref 3.5–5.1)
Sodium: 143 mmol/L (ref 135–145)
Total Bilirubin: 0.8 mg/dL (ref 0.0–1.2)
Total Protein: 6.6 g/dL (ref 6.5–8.1)

## 2023-09-24 LAB — CBC WITH DIFFERENTIAL (CANCER CENTER ONLY)
Abs Immature Granulocytes: 0.01 10*3/uL (ref 0.00–0.07)
Basophils Absolute: 0 10*3/uL (ref 0.0–0.1)
Basophils Relative: 0 %
Eosinophils Absolute: 0.2 10*3/uL (ref 0.0–0.5)
Eosinophils Relative: 3 %
HCT: 37.6 % (ref 36.0–46.0)
Hemoglobin: 12.9 g/dL (ref 12.0–15.0)
Immature Granulocytes: 0 %
Lymphocytes Relative: 28 %
Lymphs Abs: 1.7 10*3/uL (ref 0.7–4.0)
MCH: 29.5 pg (ref 26.0–34.0)
MCHC: 34.3 g/dL (ref 30.0–36.0)
MCV: 86 fL (ref 80.0–100.0)
Monocytes Absolute: 0.3 10*3/uL (ref 0.1–1.0)
Monocytes Relative: 5 %
Neutro Abs: 3.9 10*3/uL (ref 1.7–7.7)
Neutrophils Relative %: 64 %
Platelet Count: 224 10*3/uL (ref 150–400)
RBC: 4.37 MIL/uL (ref 3.87–5.11)
RDW: 12.6 % (ref 11.5–15.5)
WBC Count: 6.1 10*3/uL (ref 4.0–10.5)
nRBC: 0 % (ref 0.0–0.2)

## 2023-09-24 LAB — LACTATE DEHYDROGENASE: LDH: 166 U/L (ref 98–192)

## 2023-09-24 NOTE — Progress Notes (Unsigned)
 Hematology/Oncology Consultation   Name: Megan May      MRN: [id number]    Location: Room/bed info not found  Date: 09/24/2023 Time:3:13 PM   REFERRING PHYSICIAN:  Reuel Derby, MD  REASON FOR CONSULT:  Splenomegaly    DIAGNOSIS: Splenomegaly  HISTORY OF PRESENT ILLNESS:  Megan May is a pleasant 63 yo caucasian female with recent mild splenomegaly noted on [place] work up for abdominal pain and bloating.  She states that she typically experiences persistent pain in the left upper quadrant but has had pain in the right upper quadrant since yesterday.  [place] also showed some small cysts on the liver that appeared to be stable. She denies any issue with frequent or recurrent infections.  No fever, chills, cough, rash, dizziness, SOB, chest pain, palpitations or changes in bowel or bladder habits at this time.  She states that she has 1 daughter that was originally a triplet and experienced strokes in the womb.  She states that she was diagnosed with a positive lupus anticoagulant as well as several other clotting issues at the time. 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 There were no specific numbers are ranges listed for the high and low results.  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 She takes a low dose steroid as needed for flares. Also, Omeprazole helps.  She has had tonsils and adenoids removed, a dermoid cyst removed, C-section as well as fallopian tubes and one ovary removed in the past without any complications.  No history of diabetes or thyroid disease.  No personal history of cancer. Mother and father both have history of skin cancer.  Mammogram in 10/2022 was negative.  She had an EGD in 01/2019 and colonoscopy in 03/2022. EGD was negative. Colonoscopy showed diverticulosis throughout the entire  colon and she had two benign polyps removed from the cecum.  No smoking, ETOH or recreational drug use.  No swelling, tenderness, numbness or tingling in her extremities at this time.  No falls or syncope reported.  She is [facility]. She has eaten a pescatarian diet for over 30 years. Appetite is down but she does feel that she is staying well hydrated. Weight is stable at 188 lbs.  She had an episode of dizziness after eating walnuts and now avoids.  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   ROS: All other 10 point review of systems is negative.   PAST MEDICAL HISTORY:   Past Medical History:  Diagnosis Date   Anxiety    Asthma    Chest pain    Depression    history of    Eosinophilic esophagitis    Fatigue 07/02/2017   Generalized anxiety disorder    High blood pressure    Hyperglycemia 03/15/2016   Hyperlipidemia, mixed 06/30/2016   Hypersomnia, recurrent    Hypertension    Joint pain    Lactose intolerance    Migraine    Migraines    NAFLD (nonalcoholic fatty liver disease)    Posttraumatic stress disorder    Shingles 11/03/2021   SOB (shortness of breath)    Swallowing difficulty    Vitamin D deficiency     ALLERGIES: Allergies  Allergen Reactions   Amoxicillin-Pot Clavulanate Rash   Doxycycline Other (See Comments)    headache  Levaquin [Levofloxacin In D5w] Other (See Comments)    tendonopathy   Clindamycin Itching   Clindamycin/Lincomycin Itching   Lactose Intolerance (Gi) Nausea Only   Metoprolol     Weight gain/fluid retention    Penicillin G Other (See Comments)   Aspirin Other (See Comments)    Migraine headache   Latex Rash   Penicillins Swelling and Rash    Did it involve swelling of the face/tongue/throat, SOB, or low BP? No Did it involve sudden or severe rash/hives, skin peeling, or any reaction on the inside of your mouth or nose? Yes Did you need to seek medical attention at a  hospital or doctor's office? No When did it last happen?      63 yo If all above answers are "NO", may proceed with cephalosporin use.      MEDICATIONS:  Current Outpatient Medications on File Prior to Visit  Medication Sig Dispense Refill   azithromycin (ZITHROMAX) 250 MG tablet Take by mouth.     Fluticasone Furoate-Vilanterol (BREO ELLIPTA) 50-25 MCG/ACT AEPB Inhale into the lungs.     hyoscyamine (LEVBID) 0.375 MG 12 hr tablet Take 1 tablet (0.375 mg total) by mouth 2 (two) times daily. 60 tablet 0   lisinopril (ZESTRIL) 20 MG tablet Take 1 tablet (20 mg total) by mouth daily. 90 tablet 3   omeprazole (PRILOSEC) 20 MG capsule Take 1 capsule (20 mg total) by mouth daily. 30 capsule 3   spironolactone (ALDACTONE) 25 MG tablet Take 25 mg by mouth daily as needed (swelling).     Vitamin D, Ergocalciferol, (DRISDOL) 1.25 MG (50000 UNIT) CAPS capsule Take 1 capsule (50,000 Units total) by mouth every 7 (seven) days. 8 capsule 0   No current facility-administered medications on file prior to visit.     PAST SURGICAL HISTORY Past Surgical History:  Procedure Laterality Date   CESAREAN SECTION  2004   ESOPHAGOGASTRODUODENOSCOPY (EGD) WITH PROPOFOL N/A 01/12/2019   Procedure: ESOPHAGOGASTRODUODENOSCOPY (EGD) WITH PROPOFOL;  Surgeon: Jeani Hawking, MD;  Location: [facility];  Service: Endoscopy;  Laterality: N/A;   FRACTURE SURGERY  1973   R arm   LAPAROTOMY Bilateral 06/14/2014   Procedure: LAPAROTOMY with right  salpingo-oophorectomy and left salpingectomy;  Surgeon: Mitchel Honour, DO;  Location: [facility];  Service: Gynecology;  Laterality: Bilateral;   TONSILLECTOMY AND ADENOIDECTOMY  1969    FAMILY HISTORY: Family History  Problem Relation Age of Onset   Cancer Mother    Kidney disease Mother    Hypertension Mother    Obesity Mother    Obesity Father    Cancer Father    Hyperlipidemia Father    Hypertension Father    Heart disease Father    Diabetes Father    CAD Father     Kidney disease Father    Anxiety disorder Father    Diabetes Brother    Stroke Maternal Grandmother    Stroke Maternal Grandfather    Diabetes Paternal Grandmother    Other Cousin        bicuspid aortic valve    SOCIAL HISTORY:  reports that she has never smoked. She has never used smokeless tobacco. She reports that she does not drink alcohol and does not use drugs.  PERFORMANCE STATUS: The patient's performance status is 1 - Symptomatic but completely ambulatory  PHYSICAL EXAM: Most Recent Vital Signs: Last menstrual period 05/10/2014. LMP 05/10/2014 (Approximate)   General Appearance:    Alert, cooperative, no distress, appears stated age  Head:  Normocephalic, without obvious abnormality, atraumatic  Eyes:    PERRL, conjunctiva/corneas clear, EOM's intact, fundi    benign, both eyes        Throat:   Lips, mucosa, and tongue normal; teeth and gums normal  Neck:   Supple, symmetrical, trachea midline, no adenopathy;    thyroid:  no enlargement/tenderness/nodules; no carotid   bruit or JVD  Back:     Symmetric, no curvature, ROM normal, no CVA tenderness  Lungs:     Clear to auscultation bilaterally, respirations unlabored  Chest Wall:    No tenderness or deformity   Heart:    Regular rate and rhythm, S1 and S2 normal, no murmur, rub   or gallop     Abdomen:     Soft, tender on right upper quadrant palpation, no pain elsewhere at this time, bowel sounds active all four quadrants,    no masses, no organomegaly        Extremities:   Extremities normal, atraumatic, no cyanosis or edema  Pulses:   2+ and symmetric all extremities  Skin:   Skin color, texture, turgor normal, no rashes or lesions  Lymph nodes:   Cervical, supraclavicular, and axillary nodes normal  Neurologic:   CNII-XII intact, normal strength, sensation and reflexes    throughout    LABORATORY DATA:  No results found for this or any previous visit (from the past 48 hours).    RADIOGRAPHY: No results  found.     PATHOLOGY: None  ASSESSMENT/PLAN: Megan May is a pleasant 63 yo caucasian female with recent mild splenomegaly noted on [place] work up for abdominal pain and bloating.  She has been having left upper quadrant discomfort and bloating.  BCR/ABL and [employer] myeloid tests are pending.  Follow-up pending results.   All questions were answered. The patient knows to call the clinic with any problems, questions or concerns. We can certainly see the patient much sooner if necessary.  The patient was discussed with Dr. Myna Hidalgo and he is in agreement with the aforementioned.   Eileen Stanford, NP

## 2023-09-28 LAB — BCR-ABL1 FISH
Cells Analyzed: 200
Cells Counted: 200

## 2023-10-05 ENCOUNTER — Encounter: Payer: Self-pay | Admitting: Family

## 2023-10-08 IMAGING — DX DG FOOT COMPLETE 3+V*L*
3 series · 3 of 3 positions shown · non-contrast
Comparison: 05/15/2021

CLINICAL DATA: Trauma, pain

EXAM:
LEFT FOOT - COMPLETE 3+ VIEW

[foot ap]
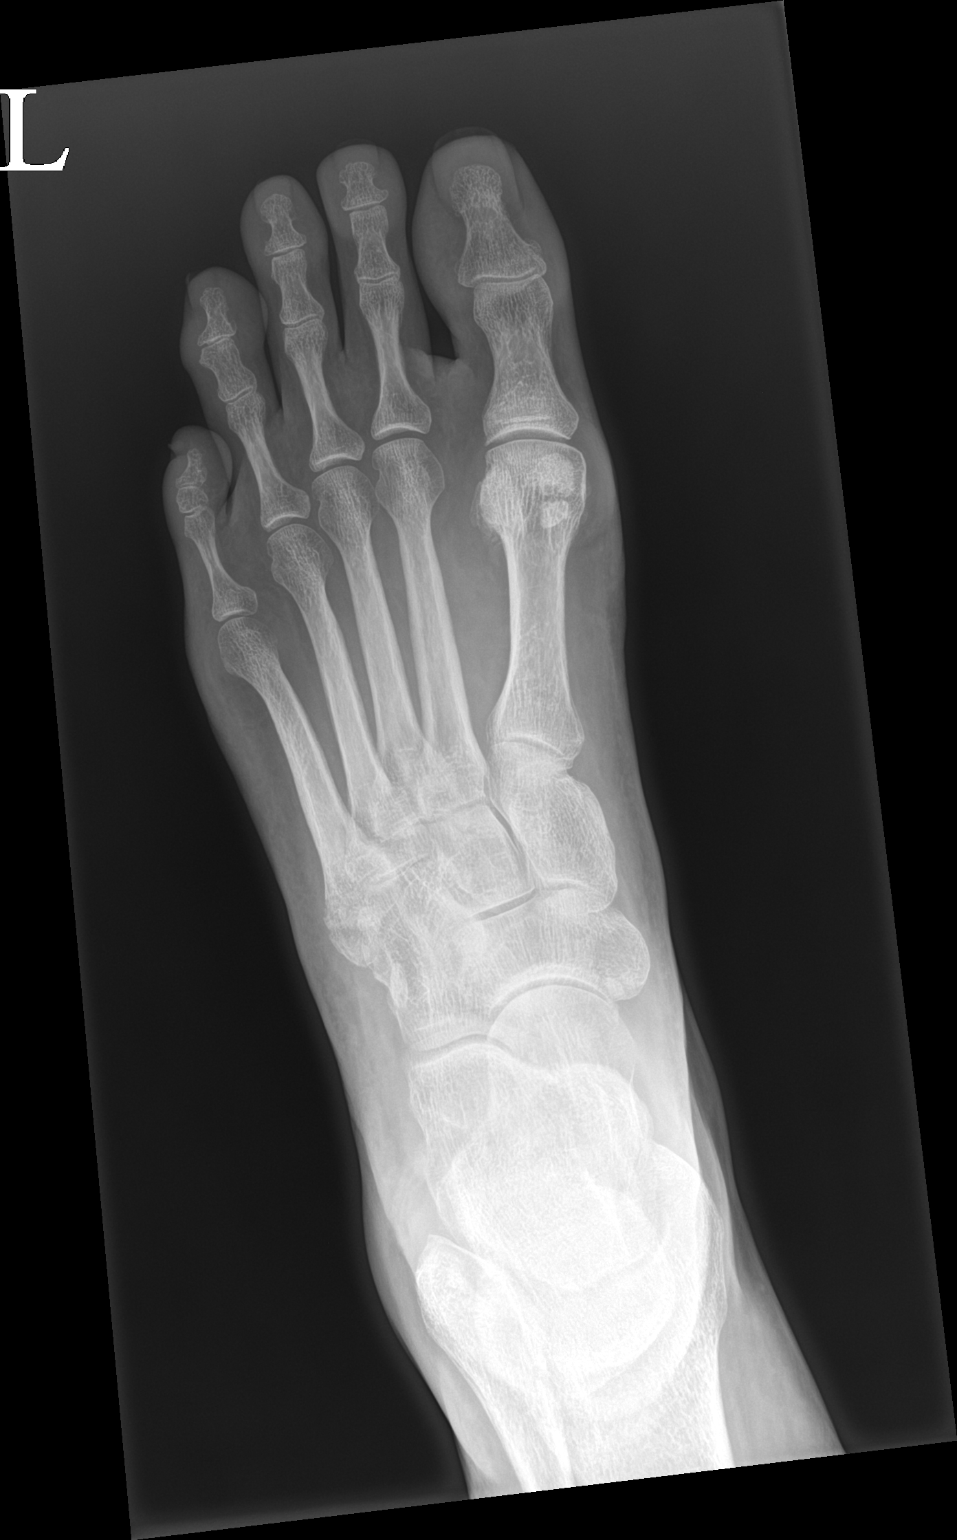

[foot obl]
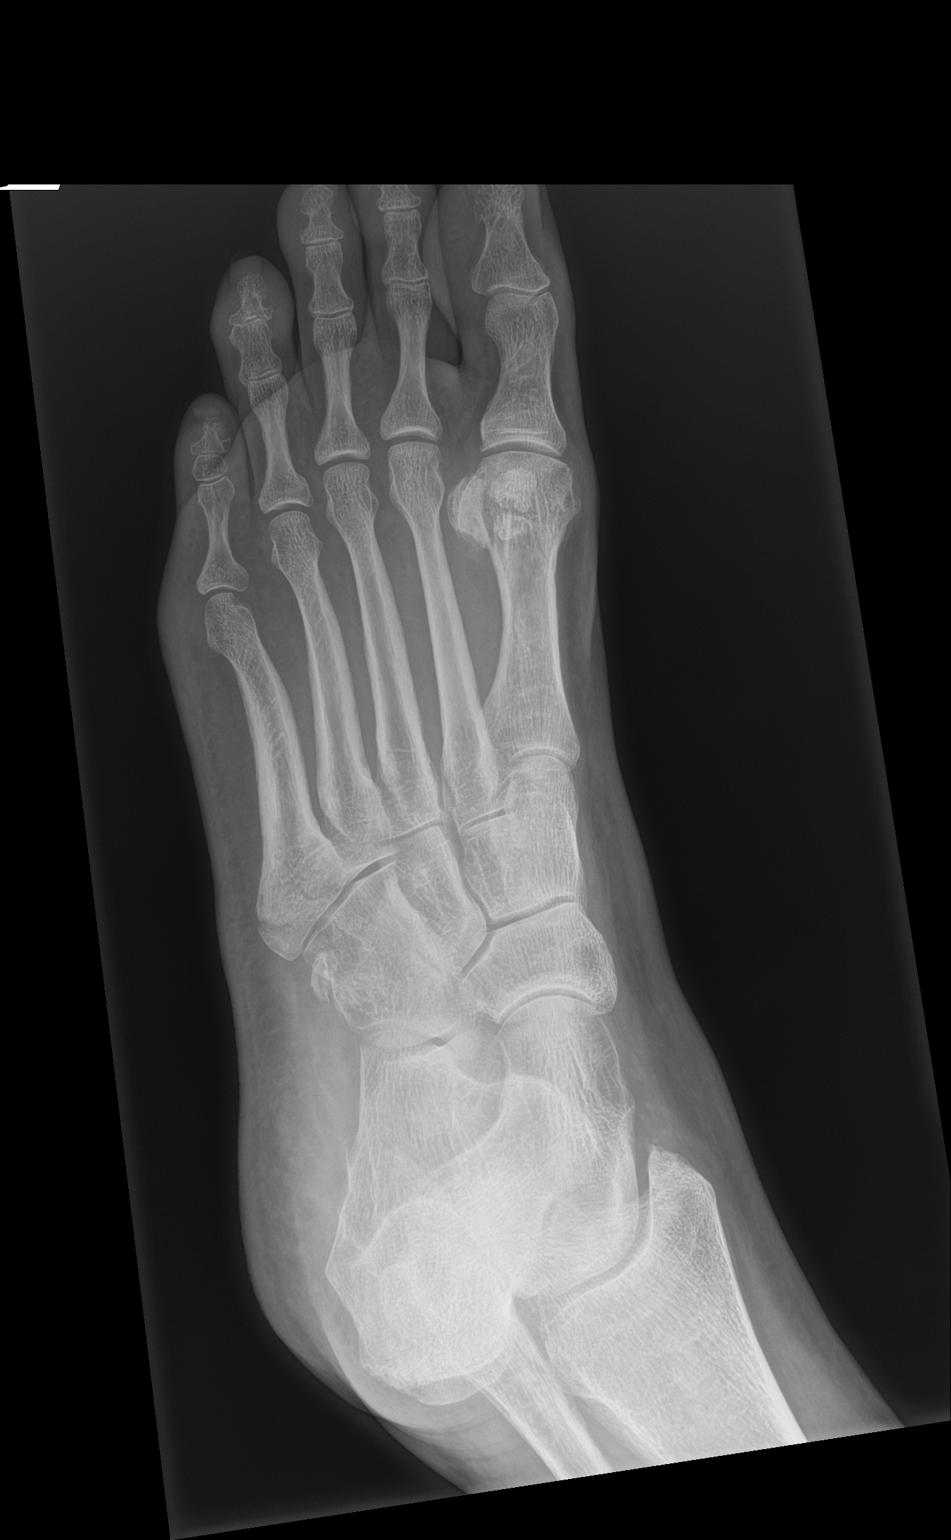

[foot lat]
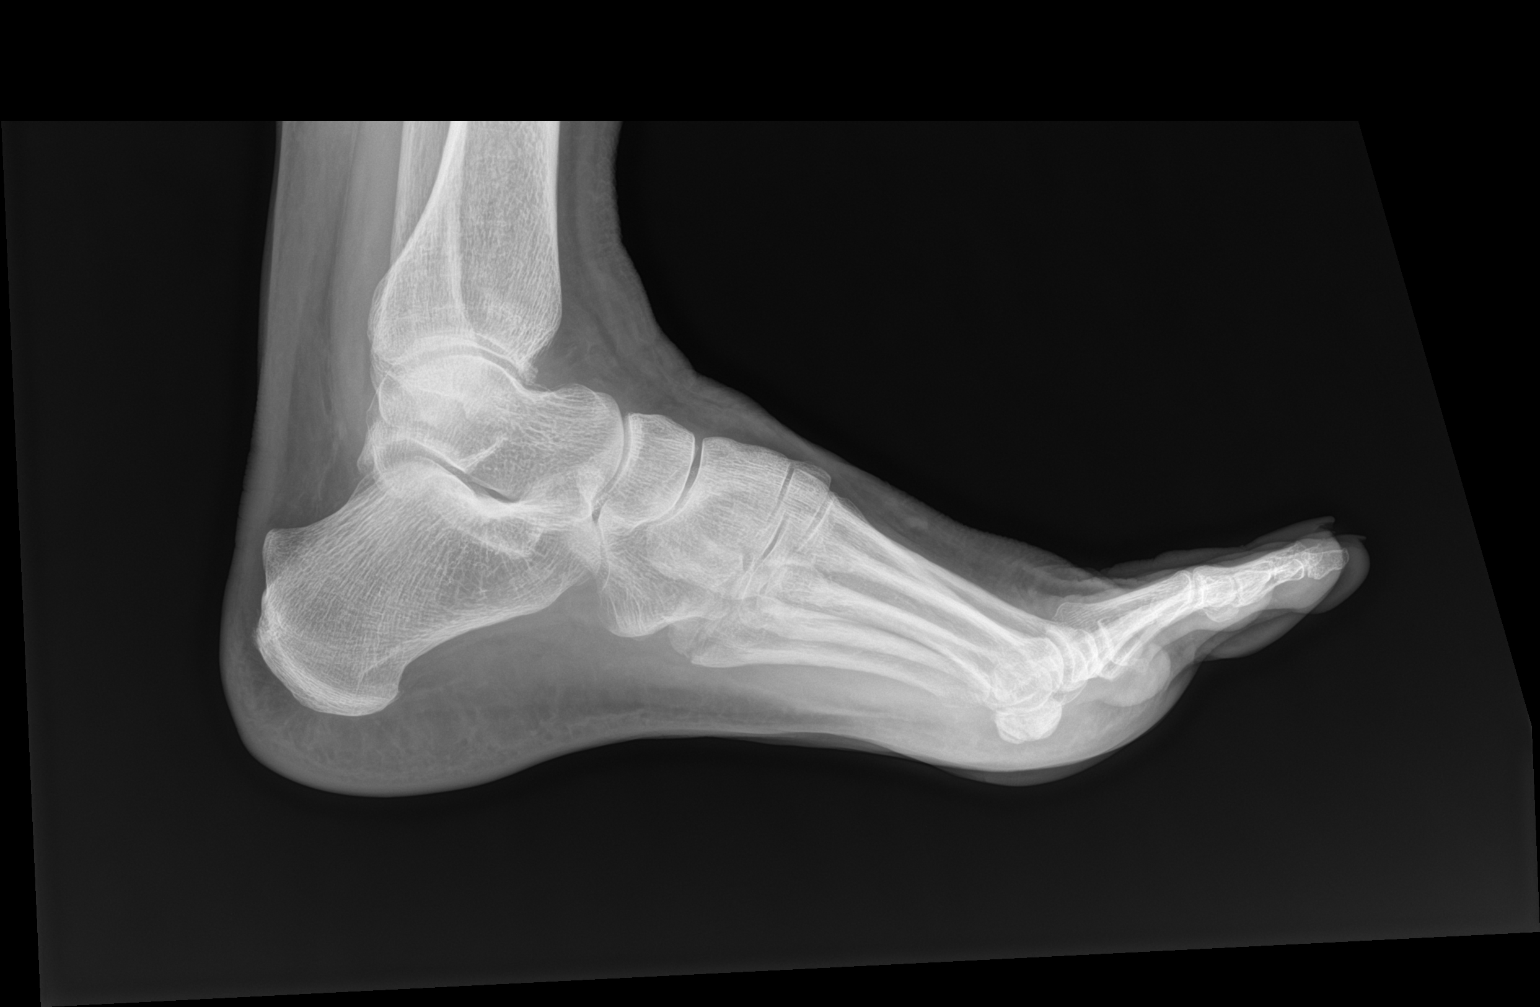

[3 of 3 positions shown; findings below may reference images not displayed]

FINDINGS: Undisplaced fracture is seen in the base of left fifth metatarsal.
There is no significant change in alignment.
IMPRESSION: Nondisplaced fracture is seen in the base of left fifth metatarsal.
There is no significant change in alignment.

## 2023-10-08 IMAGING — DX DG ANKLE COMPLETE 3+V*L*
3 series · 3 of 3 positions shown · non-contrast
Comparison: 05/15/2021

CLINICAL DATA: Trauma, pain

EXAM:
LEFT ANKLE COMPLETE - 3+ VIEW

[ankle ap]
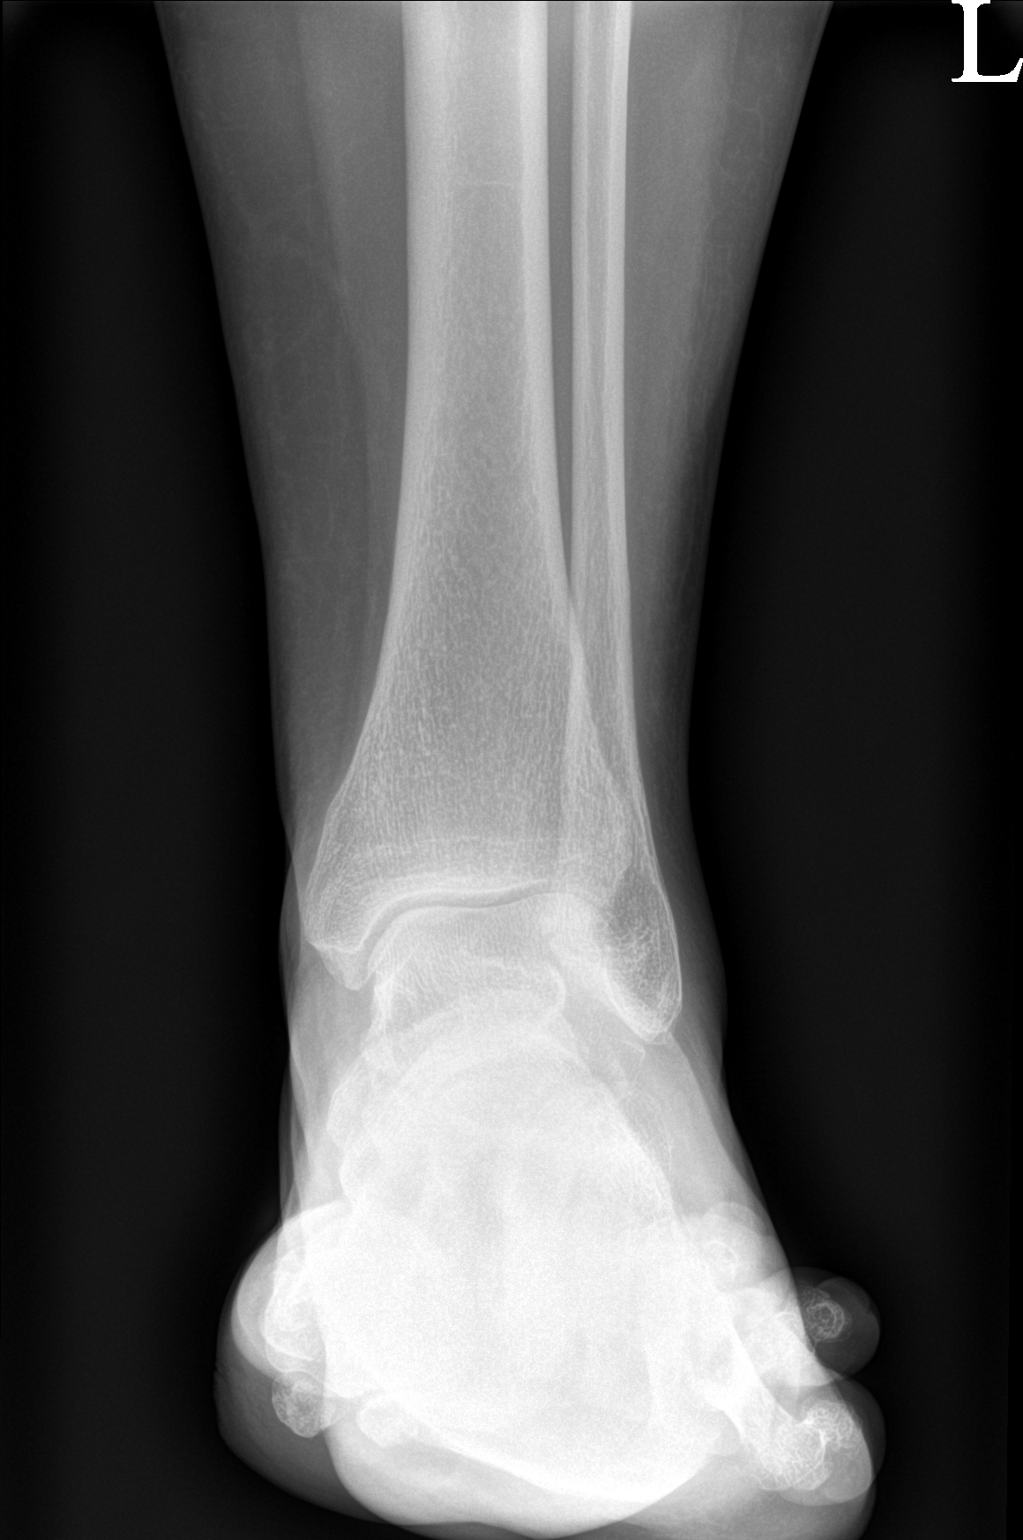

[ankle obl]
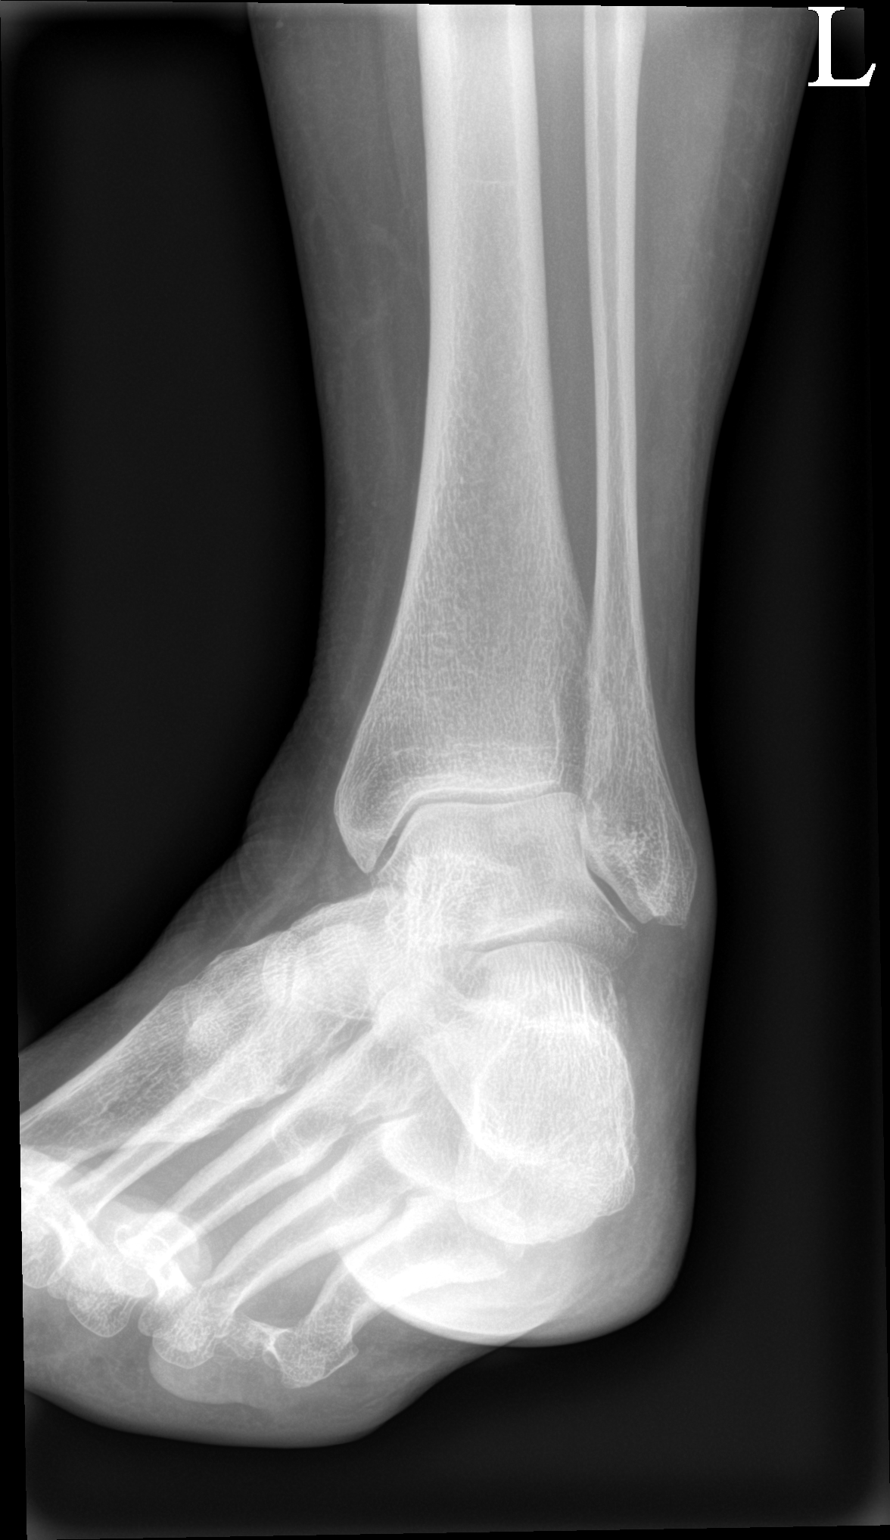

[ankle lat]
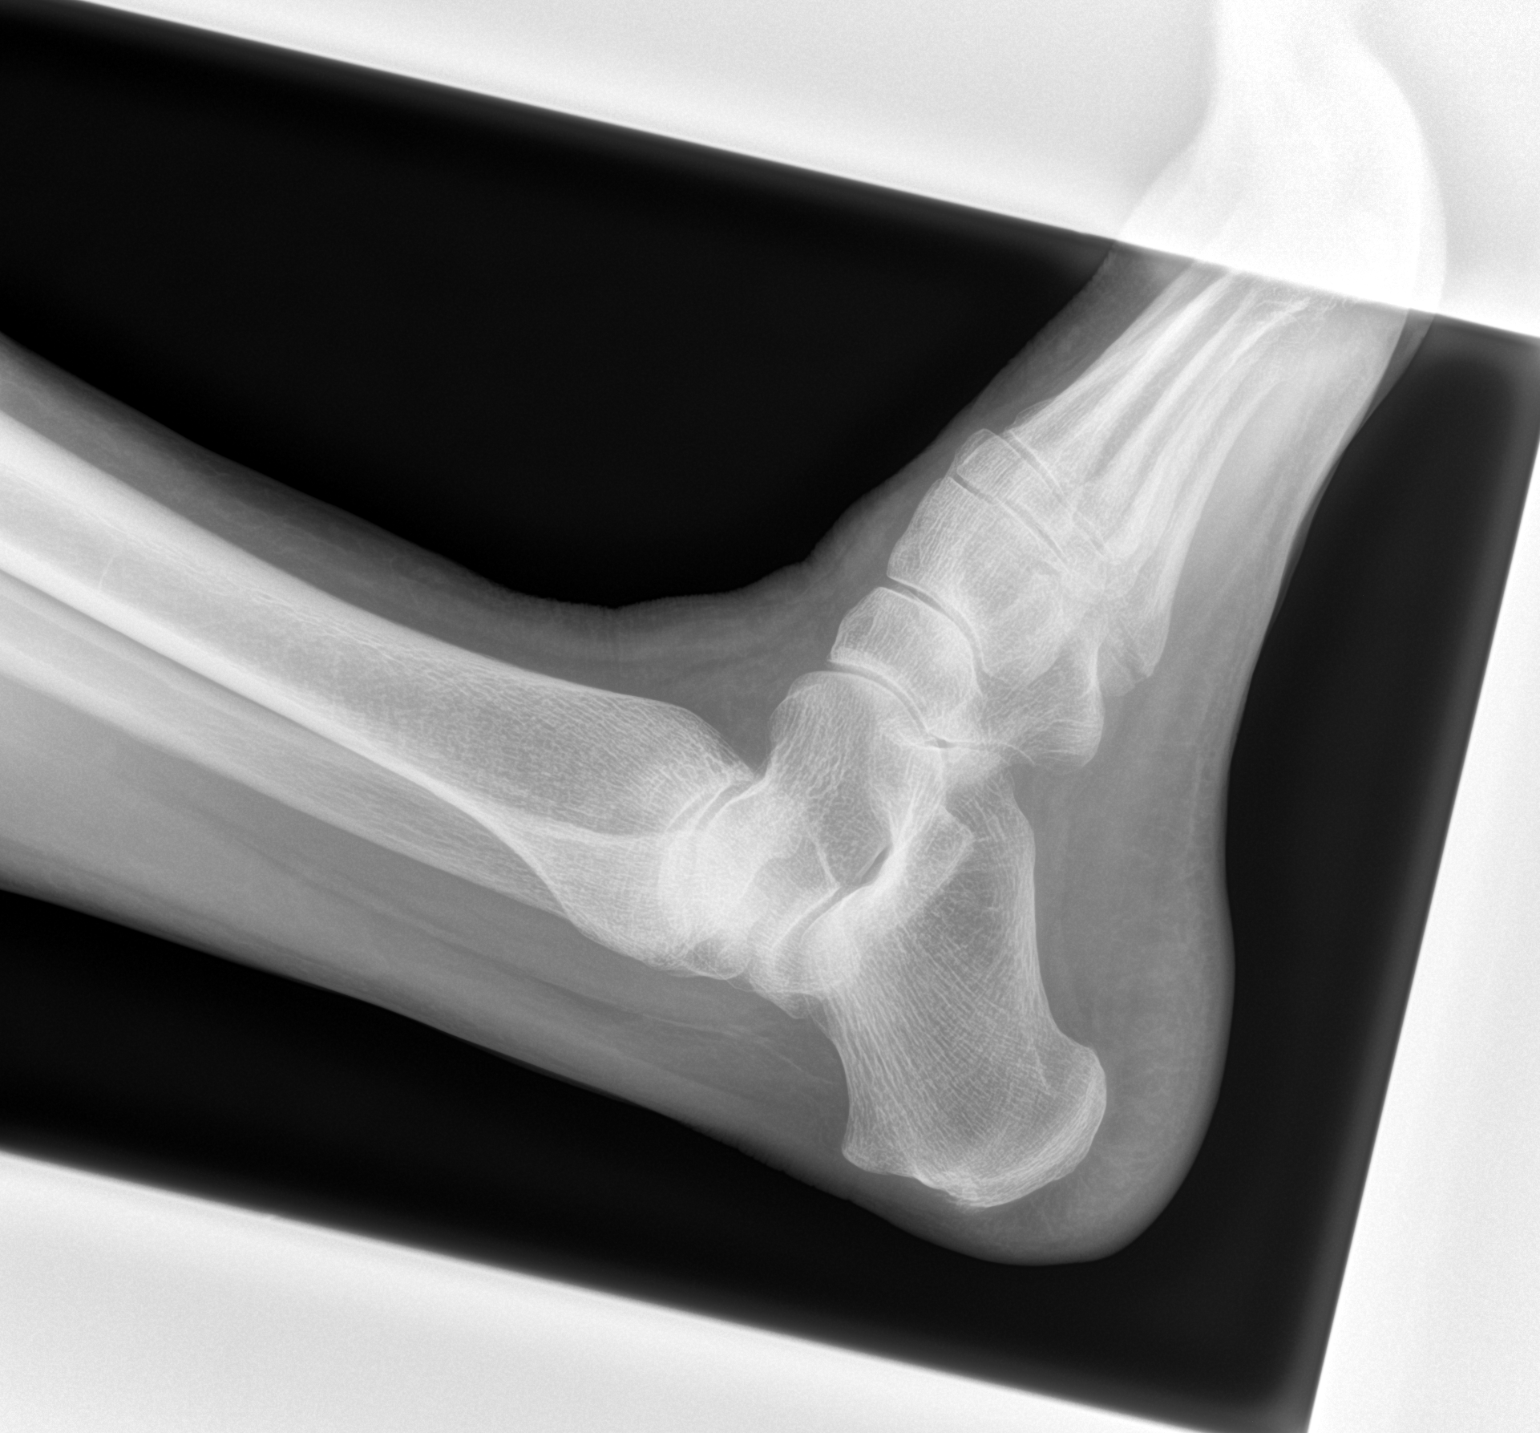

[3 of 3 positions shown; findings below may reference images not displayed]

FINDINGS: Undisplaced fracture is seen in the base of left fifth metatarsal.
There is no significant change in alignment. No new fractures are
seen.
IMPRESSION: Nondisplaced fracture in the base of left fifth metatarsal has not
changed significantly.

## 2023-10-11 ENCOUNTER — Ambulatory Visit (INDEPENDENT_AMBULATORY_CARE_PROVIDER_SITE_OTHER): Payer: 59 | Admitting: Physician Assistant

## 2023-10-11 ENCOUNTER — Encounter (INDEPENDENT_AMBULATORY_CARE_PROVIDER_SITE_OTHER): Payer: Self-pay | Admitting: Physician Assistant

## 2023-10-11 VITALS — BP 133/63 | HR 58 | Temp 98.1°F | Ht 66.0 in | Wt 183.0 lb

## 2023-10-11 DIAGNOSIS — I10 Essential (primary) hypertension: Secondary | ICD-10-CM

## 2023-10-11 DIAGNOSIS — E88819 Insulin resistance, unspecified: Secondary | ICD-10-CM

## 2023-10-11 DIAGNOSIS — E782 Mixed hyperlipidemia: Secondary | ICD-10-CM | POA: Diagnosis not present

## 2023-10-11 DIAGNOSIS — E669 Obesity, unspecified: Secondary | ICD-10-CM

## 2023-10-11 DIAGNOSIS — E559 Vitamin D deficiency, unspecified: Secondary | ICD-10-CM | POA: Diagnosis not present

## 2023-10-11 DIAGNOSIS — Z6829 Body mass index (BMI) 29.0-29.9, adult: Secondary | ICD-10-CM

## 2023-10-11 NOTE — Progress Notes (Signed)
 SUBJECTIVE: Discussed the use of AI scribe software for clinical note transcription with the patient, who gave verbal consent to proceed.  Chief Complaint: Obesity  Interim History: She is down 1 lb since last visit.  Down 7 lbs overall   Megan May is here to discuss her progress with her obesity treatment plan. She is on the Pescatarian Plan + 300 snack calories and states she is following her eating plan approximately 90 % of the time. She states she is not exercising 0 minutes 0 times per week. The patient is a 63 year old with obesity who presents for follow-up of her obesity treatment plan.  She is adhering to a pescetarian diet with an additional 300 calories per day. She experiences increased hunger in the morning and decreased appetite at night, often needing to force herself to eat. Her dietary goals include 85 grams of protein and 25-30 grams of fiber daily, with a focus on varied meals and low sodium options. Snacks include [place], Weight Watchers fudge bars, and several 110-calorie snack packs, with occasional fruit. She is cautious about sugar intake from fruit due to insulin resistance.  Her medical history includes insulin resistance, hyperlipidemia, vitamin D deficiency, and hypertension. Current medications are ergocalciferol 50,000 units once weekly, Zestril 20 mg daily, and spironolactone 25 mg as needed for swelling. Her last insulin level was 12.2, slightly elevated. She reports no complications with vitamin D supplementation.  She experienced a sore arm and wrist following a fall, which is gradually improving. she is monitoring the condition.  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  OBJECTIVE: Visit Diagnoses: Problem List Items Addressed This Visit     Essential hypertension, benign   Hyperlipidemia, mixed   Vitamin D deficiency    Insulin resistance - Primary   Other Visit Diagnoses       Generalized obesity- Start BMI 30.67         BMI 29.0-29.9,adult Current BMI 29.7         Obesity Follow-up on obesity treatment. Lost 1 lb but progress is slow. Current pescetarian diet includes an additional 300 calories, contributing to slower weight loss. Reports increased morning hunger and suboptimal snack choices. Discussed reducing snack calories, increasing protein intake, and incorporating low glycemic index fruits for fiber. Encouraged to add walking to daily routine. - Decrease snack calories to 100 per day - If very hungry, add another 100 calories - Increase protein intake, e.g., Greek yogurt - Add 15 minutes of walking after dinner, 3-4 times a week  Insulin Resistance Insulin level slightly elevated at 12.2. Advised to monitor diet closely, particularly snack choices, to manage insulin levels. - Monitor diet closely, particularly snack choices  Hypertension Blood pressure 133/63, heart rate 58. On Zestril 20 mg daily and spironolactone 25 mg as needed for swelling. Blood pressure well-controlled. - Continue current medications  Hyperlipidemia No specific discussion on lipid levels today. Diet and weight management are crucial for control. - Continue current diet and weight management plan  Vitamin D Deficiency On ergocalciferol 50,000 units weekly. Last vitamin D level was low in January. No complications reported with supplementation. - Recheck vitamin D levels after 12 weeks of supplementation  General Health Maintenance Encouraged to incorporate more low glycemic index fruits for fiber and ensure adequate protein intake. - Incorporate more low glycemic index fruits for fiber - Ensure adequate protein intake, aiming for at  least 85 grams per day  Follow-up - Follow-up appointment on October 25, 2023 at 3:30 PM - Follow-up appointment on November 08, 2023 at 3:30 PM.  Vitals Temp: 98.1 F (36.7 C) BP:  133/63 Pulse Rate: (!) 58 SpO2: 98 %   Anthropometric Measurements Height: 5\' 6"  (1.676 m) Weight: 183 lb (83 kg) BMI (Calculated): 29.55 Weight at Last Visit: 184lb Weight Lost Since Last Visit: 1lb Weight Gained Since Last Visit: 0 Starting Weight: 190lb Total Weight Loss (lbs): 7 lb (3.175 kg) Peak Weight: 204lb   Body Composition  Body Fat %: 40.6 % Fat Mass (lbs): 74.6 lbs Muscle Mass (lbs): 103.6 lbs Total Body Water (lbs): 72.8 lbs Visceral Fat Rating : 10   Other Clinical Data Fasting: no Labs: no Today's Visit #: 4 Starting Date: 08/11/23     ASSESSMENT AND PLAN:  Diet: Megan May is currently in the action stage of change. As such, her goal is to continue with weight loss efforts and has agreed to the Pescatarian Plan.+ 100 calorie snack with increased protein .    Exercise:  Start walking 15 minutes 3 times weekly- Reviewed walking with leslie videos.  Behavior Modification:  We discussed the following Behavioral Modification Strategies today: increasing lean protein intake, decreasing simple carbohydrates, increasing vegetables, increase H2O intake, increase high fiber foods, better snacking choices, avoiding temptations, and planning for success. We discussed various medication options to help Megan May with her weight loss efforts and we both agreed to continue to work on nutritional and behavioral strategies to promote weight loss.  .  Return in about 2 weeks (around 10/25/2023).Marland Kitchen She was informed of the importance of frequent follow up visits to maximize her success with intensive lifestyle modifications for her multiple health conditions.  Attestation Statements:   Reviewed by clinician on day of visit: allergies, medications, problem list, medical history, surgical history, family history, social history, and previous encounter notes.   Time spent on visit including pre-visit chart review and post-visit care and charting was 35  minutes  Meia Emley,PA-C

## 2023-10-12 LAB — INTELLIGEN MYELOID

## 2023-10-13 ENCOUNTER — Telehealth: Payer: Self-pay | Admitting: Family

## 2023-10-13 NOTE — Telephone Encounter (Signed)
 I was able to speak with the patient and let her know both BCR/ABL and [employer]ell[employer] Myeloid tests came back negative. She will continue to follow-up with PCP and GI to monitor spleen size. No follow-up indicated with our office at this time. We can certainly see her again for any future hem/onc issues that may arise.

## 2023-10-21 NOTE — Progress Notes (Signed)
 SUBJECTIVE: Discussed the use of AI scribe software for clinical note transcription with the patient, who gave verbal consent to proceed.  Chief Complaint: Obesity  Interim History: She is down 3 lbs since last visit.  Down 10 lbs overall TBW loss of 5.3% Megan May is here to discuss her progress with her obesity treatment plan. She is on the [employer] and states she is following her eating plan approximately 75 % of the time. She states she is exercising walking 25 minutes 3 times per week.  Megan May is a 63 year old female with obesity who presents for follow-up of her obesity treatment plan.  She has achieved a weight loss of 5.3%. She has incorporated walking into her routine, initially walking twice in the first week and increasing to three times in the second week. Her body fat percentage is currently 39.9%, with a goal to reduce it to 35% or less.  She experiences high stress levels, particularly due to her daughter's situation, which she finds distressing. This stress has impacted her eating habits, leading to cravings and consumption of high-calorie snacks like gummy bears. Additionally, her work has been very busy, contributing to her stress.  She is managing her calorie intake and trying to meet her protein needs by substituting snacks with healthier options like yogurt and apples with PB2. She is not consuming chicken but includes dairy, fish, and eggs in her diet. She is careful about meal planning, ensuring she knows the protein, fat, and calorie content of her meals.  Her sleep pattern is variable, with more rest on weekends due to exhaustion. She does not feel hungry when adhering to her eating plan and drinks fluids throughout the day and night.  She is currently taking vitamin D supplements, 2000 IU daily, but is not consistent with it. She previously took a higher dose daily instead of weekly, which was not as intended. Her vitamin D levels were noted to  be low in January. OBJECTIVE: Visit Diagnoses: Problem List Items Addressed This Visit     Vitamin D deficiency   Relevant Medications   Vitamin D, Ergocalciferol, (DRISDOL) 1.25 MG (50000 UNIT) CAPS capsule   Insulin resistance - Primary   Other Visit Diagnoses       Generalized obesity- Start BMI 30.67         BMI 29.0-29.9,adult Current BMI 29.1         Obesity She has achieved a 5.3% weight loss- with a current body  adipose percentage of 39.9%.  The target is to decrease body adipose< 35%.  Her weight loss is attributed to dietary management and increased physical activity, specifically walking.  Challenges include stress and cravings for gummy bears, but she maintains her calorie intake.  Emphasized protein intake and strategies to manage stress and cravings.  Discussed benefits of walking within 20 minutes postprandially to prevent insulin spikes and stabilize blood glucose levels, reducing (fat) adipose storage. - Continue current diet and exercise regimen - Increase walking frequency as tolerated - Incorporate protein-rich snacks like yogurt and apples with PB2 - Consider walking within 20 minutes of eating to prevent insulin spikes - Schedule follow-up appointment on November 29, 2023  Insulin Resistance Insulin resistance management includes strategies to stabilize blood glucose levels, such as postprandial walking to prevent insulin spikes. Lab Results  Component Value Date   HGBA1C 5.4 08/11/2023   HGBA1C 5.3 06/18/2016   HGBA1C 5.4 03/03/2016   Lab Results  Component Value Date   LDLCALC  153 (H) 08/11/2023   CREATININE 0.83 09/24/2023   INSULIN  Date Value Ref Range Status  08/11/2023 12.2 2.6 - 24.9 uIU/mL Final  Continue working on nutrition plan to decrease simple carbohydrates, increase lean proteins and exercise to promote weight loss, improve glycemic control and prevent progression to Type 2 diabetes.    Vitamin D Deficiency Vitamin D levels remain  low despite 2000 IU daily supplementation, taken inconsistently.  Last vitamin D Lab Results  Component Value Date   VD25OH 22.5 (L) 08/11/2023   Suggested a weekly regimen to improve compliance and potentially enhance energy levels. The goal is a vitamin D level of 50-70 ng/mL, which may aid in weight loss and energy improvement. - Prescribe weekly vitamin D supplementation- Ergocalciferol 50,000 units weekly.  - Check vitamin D levels in 2-3 months - Send prescription to [facility] in [place] [facility] ordered this encounter  Medications   Vitamin D, Ergocalciferol, (DRISDOL) 1.25 MG (50000 UNIT) CAPS capsule    Sig: Take 1 capsule (50,000 Units total) by mouth every 7 (seven) days.    Dispense:  8 capsule    Refill:  0     General Health Maintenance Encouraged to maintain a healthy lifestyle through diet, exercise, and stress management. Provided suggestions for incorporating more physical activity and managing stress through walking and other activities. - Encourage regular physical activity - Promote stress management techniques - Ensure adequate hydration  Vitals Temp: 98.3 F (36.8 C) BP: 122/77 Pulse Rate: 64 SpO2: 100 %   Anthropometric Measurements Height: 5\' 6"  (1.676 m) Weight: 180 lb (81.6 kg) BMI (Calculated): 29.07 Weight at Last Visit: 183lb Weight Lost Since Last Visit: 3lb Weight Gained Since Last Visit: 0 Starting Weight: 190lb Total Weight Loss (lbs): 10 lb (4.536 kg) Peak Weight: 204lb   Body Composition  Body Fat %: 39.9 % Fat Mass (lbs): 71.8 lbs Muscle Mass (lbs): 102.3 lbs Total Body Water (lbs): 72.4 lbs Visceral Fat Rating : 10   Other Clinical Data Fasting: no Labs: no Today's Visit #: 5 Starting Date: 08/11/23     ASSESSMENT AND PLAN:  Diet: Megan May is currently in the action stage of change. As such, her goal is to continue with weight loss efforts. She has agreed to [employer].  Exercise: Megan May has been instructed to  work up to a goal of 150 minutes of combined cardio and strengthening exercise per week for weight loss and overall health benefits.   Behavior Modification:  We discussed the following Behavioral Modification Strategies today: increasing lean protein intake, decreasing simple carbohydrates, increasing vegetables, increase H2O intake, increase high fiber foods, avoiding temptations, and planning for success. We discussed various medication options to help Megan May with her weight loss efforts and we both agreed to continue to work on nutritional and behavioral strategies to promote weight loss.  .  Return in about 2 weeks (around 11/08/2023).Marland Kitchen She was informed of the importance of frequent follow up visits to maximize her success with intensive lifestyle modifications for her multiple health conditions.  Attestation Statements:   Reviewed by clinician on day of visit: allergies, medications, problem list, medical history, surgical history, family history, social history, and previous encounter notes.   Time spent on visit including pre-visit chart review and post-visit care and charting was 27 minutes.    Katieann Hungate, PA-C

## 2023-10-25 ENCOUNTER — Encounter (INDEPENDENT_AMBULATORY_CARE_PROVIDER_SITE_OTHER): Payer: Self-pay | Admitting: Physician Assistant

## 2023-10-25 ENCOUNTER — Ambulatory Visit (INDEPENDENT_AMBULATORY_CARE_PROVIDER_SITE_OTHER): Admitting: Physician Assistant

## 2023-10-25 VITALS — BP 122/77 | HR 64 | Temp 98.3°F | Ht 66.0 in | Wt 180.0 lb

## 2023-10-25 DIAGNOSIS — E559 Vitamin D deficiency, unspecified: Secondary | ICD-10-CM | POA: Diagnosis not present

## 2023-10-25 DIAGNOSIS — Z6829 Body mass index (BMI) 29.0-29.9, adult: Secondary | ICD-10-CM | POA: Diagnosis not present

## 2023-10-25 DIAGNOSIS — E669 Obesity, unspecified: Secondary | ICD-10-CM

## 2023-10-25 DIAGNOSIS — E88819 Insulin resistance, unspecified: Secondary | ICD-10-CM | POA: Diagnosis not present

## 2023-10-25 MED ORDER — VITAMIN D (ERGOCALCIFEROL) 1.25 MG (50000 UNIT) PO CAPS: 50000.0000 [IU] | ORAL_CAPSULE | ORAL | 0 refills | Status: DC

## 2023-11-08 ENCOUNTER — Other Ambulatory Visit: Payer: Self-pay | Admitting: Medical

## 2023-11-08 ENCOUNTER — Encounter (INDEPENDENT_AMBULATORY_CARE_PROVIDER_SITE_OTHER): Payer: Self-pay | Admitting: Physician Assistant

## 2023-11-08 ENCOUNTER — Ambulatory Visit (INDEPENDENT_AMBULATORY_CARE_PROVIDER_SITE_OTHER): Admitting: Physician Assistant

## 2023-11-08 VITALS — BP 105/61 | HR 63 | Temp 98.0°F | Ht 66.0 in | Wt 179.0 lb

## 2023-11-08 DIAGNOSIS — E88819 Insulin resistance, unspecified: Secondary | ICD-10-CM | POA: Diagnosis not present

## 2023-11-08 DIAGNOSIS — E559 Vitamin D deficiency, unspecified: Secondary | ICD-10-CM

## 2023-11-08 DIAGNOSIS — E782 Mixed hyperlipidemia: Secondary | ICD-10-CM

## 2023-11-08 DIAGNOSIS — E669 Obesity, unspecified: Secondary | ICD-10-CM

## 2023-11-08 DIAGNOSIS — I10 Essential (primary) hypertension: Secondary | ICD-10-CM

## 2023-11-08 DIAGNOSIS — Z6828 Body mass index (BMI) 28.0-28.9, adult: Secondary | ICD-10-CM

## 2023-11-08 NOTE — Progress Notes (Signed)
 SUBJECTIVE: Discussed the use of AI scribe software for clinical note transcription with the patient, who gave verbal consent to proceed.  Chief Complaint: Obesity  Interim History: She is down 1 lb since last visit Down 11 lbs overall TBW loss of 5.8 %  Yessenia is here to discuss her progress with her obesity treatment plan. She is on the [employer] and states she is following her eating plan approximately 90 % of the time. She states she is exercising walking and running, alternating for 25-30 minutes 2-4 times per week. REIA VIERNES is a 63 year old female who presents for follow-up of her obesity treatment plan.  She follows a pescatarian diet plan approximately 90% of the time and has lost a total of 11 pounds since starting her weight loss journey, with a 1-pound loss since her last visit. Her muscle mass has increased, and her adipose tissue percentage has decreased from 42% to 39%, with a goal of reaching <35%.  She engages in physical activity by alternating walking and running for 20 to 30 minutes, two to four times per week. She experiences back pain during exercise, which sometimes requires her to take a day or two off. She has not sought medical attention for her back pain as it only occurs during exercise.  She is not currently on any medication for her hyperlipidemia and has not tried statins before. She feels frustrated with the slow progress of her weight loss despite her efforts, noting that it should not be this difficult given the amount of weight she needs to lose. She also mentions challenges in finding suitable exercise classes that fit her schedule.  No issues with her current vitamin D supplementation, although she has previously experienced fatigue with its use.  OBJECTIVE: Visit Diagnoses: Problem List Items Addressed This Visit     Essential hypertension, benign   Hyperlipidemia, mixed   Vitamin D deficiency   Insulin resistance - Primary    Other Visit Diagnoses       Generalized obesity- Start BMI 30.67         Obesity She is experiencing slow but steady weight loss, having lost 11 pounds overall. She is following a pescatarian diet plan approximately 90% of the time and is engaging in physical activity by alternating walking and running for 20 to 30 minutes, two to four times per week. Her muscle mass has increased, and her adipose tissue percentage has decreased from 42% to 39%, with a goal of reaching <35%. She expresses frustration with the slow progress, and there is a discussion about the challenges of weight loss, especially with age-related metabolic changes. The possibility of using medications like Zepbound and Frederik Jansky was discussed, but cost and insurance coverage are barriers. Concerns about the safety of compounded medications were also mentioned. - Encourage continuation of the pescatarian diet and current exercise regimen. - Suggest incorporating weight lifting to potentially enhance weight loss. - Discuss the possibility of trying low-impact aerobics classes at [facility] or online recorded classes for convenience. - Explore [facility] for monitored exercise sessions.  Mixed Hyperlipidemia She has mixed hyperlipidemia but is not currently on statin therapy. There is a discussion about the potential use of statins, considering her muscle aches, and the possibility of starting at a low dose if needed. - Consider rechecking lipid levels next month. - Discuss the potential use of statins at a low dose if hyperlipidemia management is needed.  Hypertension Her blood pressure is well-controlled at  105/61 mmHg with a heart rate of 63 bpm.  Vitamin D Deficiency She is on vitamin D supplementation, but reports no significant changes in energy levels. There is a mention of previous experiences of increased tiredness with vitamin D supplementation, but not currently. - Recheck vitamin D levels at the  next visit.  Vitals Temp: 98 F (36.7 C) BP: 105/61 Pulse Rate: 63 SpO2: 100 %   Anthropometric Measurements Height: 5\' 6"  (1.676 m) Weight: 179 lb (81.2 kg) BMI (Calculated): 28.91 Weight at Last Visit: 180 lb Weight Lost Since Last Visit: 1 lb Weight Gained Since Last Visit: 0 Starting Weight: 190 lb Total Weight Loss (lbs): 11 lb (4.99 kg) Peak Weight: 204 lb   Body Composition  Body Fat %: 39.3 % Fat Mass (lbs): 70.6 lbs Muscle Mass (lbs): 103.2 lbs Total Body Water (lbs): 72 lbs Visceral Fat Rating : 10   Other Clinical Data Fasting: no Labs: no Today's Visit #: 6 Starting Date: 08/11/23     ASSESSMENT AND PLAN:  Diet: Danah is currently in the action stage of change. As such, her goal is to continue with weight loss efforts. She has agreed to [employer].  Exercise: Lorenza has been instructed to try a geriatric exercise plan and that some exercise is better than none for weight loss and overall health benefits.   Behavior Modification:  We discussed the following Behavioral Modification Strategies today: increasing lean protein intake, decreasing simple carbohydrates, increasing vegetables, increase H2O intake, increase high fiber foods, avoiding temptations, and planning for success. We discussed various medication options to help Kyarah with her weight loss efforts and we both agreed to continue to work on nutritional and behavioral strategies to promote weight loss.  .  Return in about 3 weeks (around 11/29/2023).Aaron Aas She was informed of the importance of frequent follow up visits to maximize her success with intensive lifestyle modifications for her multiple health conditions.  Attestation Statements:   Reviewed by clinician on day of visit: allergies, medications, problem list, medical history, surgical history, family history, social history, and previous encounter notes.   Time spent on visit including pre-visit chart review and post-visit care  and charting was 28 minutes.    Friend Dorfman, PA-C

## 2023-11-28 NOTE — Progress Notes (Unsigned)
   SUBJECTIVE: Discussed the use of AI scribe software for clinical note transcription with the patient, who gave verbal consent to proceed.  Chief Complaint: Obesity  Interim History: ***  Megan May is here to discuss her progress with her obesity treatment plan. She is on the {HWW Weight Loss Plan:210964005} and states she {CHL AMB IS/IS NOT:210130109} following her eating plan approximately *** % of the time. She states she {CHL AMB IS/IS NOT:210130109} exercising *** minutes *** times per week.   OBJECTIVE: Visit Diagnoses: Problem List Items Addressed This Visit     Essential hypertension, benign   Hyperlipidemia, mixed   Vitamin D  deficiency   Insulin  resistance - Primary   Other Visit Diagnoses       Generalized obesity- Start BMI 30.67           No data recorded No data recorded No data recorded No data recorded   ASSESSMENT AND PLAN:  Diet: Karee {CHL AMB IS/IS NOT:210130109} currently in the action stage of change. As such, her goal is to {HWW Weight Loss Efforts:210964006}. She {HAS HAS ZOX:09604} agreed to {HWW Weight Loss Plan:210964005}.  Exercise: Lailanie has been instructed {HWW Exercise:210964007} for weight loss and overall health benefits.   Behavior Modification:  We discussed the following Behavioral Modification Strategies today: {HWW Behavior Modification:210964008}. We discussed various medication options to help Kosha with her weight loss efforts and we both agreed to ***.  No follow-ups on file.Aaron Aas She was informed of the importance of frequent follow up visits to maximize her success with intensive lifestyle modifications for her multiple health conditions.  Attestation Statements:   Reviewed by clinician on day of visit: allergies, medications, problem list, medical history, surgical history, family history, social history, and previous encounter notes.   Time spent on visit including pre-visit chart review and post-visit care and charting was  *** minutes.    Calea Hribar, PA-C

## 2023-11-29 ENCOUNTER — Ambulatory Visit (INDEPENDENT_AMBULATORY_CARE_PROVIDER_SITE_OTHER): Admitting: Physician Assistant

## 2023-11-29 DIAGNOSIS — E559 Vitamin D deficiency, unspecified: Secondary | ICD-10-CM

## 2023-11-29 DIAGNOSIS — E669 Obesity, unspecified: Secondary | ICD-10-CM

## 2023-11-29 DIAGNOSIS — E782 Mixed hyperlipidemia: Secondary | ICD-10-CM

## 2023-11-29 DIAGNOSIS — E88819 Insulin resistance, unspecified: Secondary | ICD-10-CM

## 2023-11-29 DIAGNOSIS — I10 Essential (primary) hypertension: Secondary | ICD-10-CM

## 2023-12-14 ENCOUNTER — Ambulatory Visit (INDEPENDENT_AMBULATORY_CARE_PROVIDER_SITE_OTHER): Admitting: Family Medicine

## 2023-12-16 ENCOUNTER — Encounter (INDEPENDENT_AMBULATORY_CARE_PROVIDER_SITE_OTHER): Payer: Self-pay | Admitting: Family Medicine

## 2023-12-16 ENCOUNTER — Ambulatory Visit (INDEPENDENT_AMBULATORY_CARE_PROVIDER_SITE_OTHER): Admitting: Family Medicine

## 2023-12-16 VITALS — BP 152/81 | HR 73 | Temp 98.5°F | Ht 66.0 in | Wt 179.0 lb

## 2023-12-16 DIAGNOSIS — E669 Obesity, unspecified: Secondary | ICD-10-CM

## 2023-12-16 DIAGNOSIS — I10 Essential (primary) hypertension: Secondary | ICD-10-CM | POA: Diagnosis not present

## 2023-12-16 DIAGNOSIS — Z6828 Body mass index (BMI) 28.0-28.9, adult: Secondary | ICD-10-CM

## 2023-12-16 NOTE — Assessment & Plan Note (Signed)
 Blood pressure elevated today.  No chest pain, chest pressure or headache.  She is on lisinopril  and aldactone .  No change in medications or doses- last few BP in the office very well controlled.  F/u on BP at next appointment.

## 2023-12-16 NOTE — Progress Notes (Signed)
   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 School doesn't really end per say but tends to be less busy after week of June 16th.  Not sure what she is doing for her birthday.  Thinks that the meal plan she is on will be the easiest to stay consistent with for the next few weeks.  Mckenzie is here to discuss her progress with her obesity treatment plan. She is on the [employer] and states she is following her eating plan approximately 50 % of the time. She states she is not exercising.   OBJECTIVE: Visit Diagnoses: Problem List Items Addressed This Visit       Cardiovascular and Mediastinum   Hypertension - Primary   Blood pressure elevated today.  No chest pain, chest pressure or headache.  She is on lisinopril  and aldactone .  No change in medications or doses- last few BP in the office very well controlled.  F/u on BP at next appointment.      Other Visit Diagnoses       Generalized obesity- Start BMI 30.67         BMI 28.0-28.9,adult           Vitals Temp: 98.5 F (36.9 C) BP: (!) 152/81 Pulse Rate: 73 SpO2: 100 %   Anthropometric Measurements Height: 5\' 6"  (1.676 m) Weight: 179 lb (81.2 kg) BMI (Calculated): 28.91 Weight at Last Visit: 179 lb Weight Lost Since Last Visit: 0 Weight Gained Since Last Visit: 0 Starting Weight: 190 lb Total Weight Loss (lbs): 11 lb (4.99 kg) Peak Weight: 204 lb   Body Composition  Body Fat %: 38.2 % Fat Mass (lbs): 68.6 lbs Muscle Mass (lbs): 105.4 lbs Total Body Water (lbs): 70 lbs Visceral Fat Rating : 10   Other Clinical Data Fasting: No Labs: No Today's Visit #: 7 Starting Date: 08/11/23     ASSESSMENT AND PLAN:  Diet: Rhandi is currently in the action stage of change. As  such, her goal is to continue with weight loss efforts and has agreed to the [employer].  Patient knows appropriate substitutions to make on plan.  Exercise:  For substantial health benefits, adults should do at least 150 minutes (2 hours and 30 minutes) a week of moderate-intensity, or 75 minutes (1 hour and 15 minutes) a week of vigorous-intensity aerobic physical activity, or an equivalent combination of moderate- and vigorous-intensity aerobic activity. Aerobic activity should be performed in episodes of at least 10 minutes, and preferably, it should be spread throughout the week.  Behavior Modification:  We discussed the following Behavioral Modification Strategies today: increasing lean protein intake, decreasing simple carbohydrates, increasing vegetables, meal planning and cooking strategies, and keeping healthy foods in the home.   Return in about 6 weeks (around 01/27/2024).   She was informed of the importance of frequent follow up visits to maximize her success with intensive lifestyle modifications for her multiple health conditions.  Attestation Statements:   Reviewed by clinician on day of visit: allergies, medications, problem list, medical history, surgical history, family history, social history, and previous encounter notes.   Donaciano Frizzle, MD

## 2023-12-30 ENCOUNTER — Other Ambulatory Visit (INDEPENDENT_AMBULATORY_CARE_PROVIDER_SITE_OTHER): Payer: Self-pay | Admitting: Physician Assistant

## 2023-12-30 DIAGNOSIS — E559 Vitamin D deficiency, unspecified: Secondary | ICD-10-CM

## 2024-01-04 ENCOUNTER — Telehealth (INDEPENDENT_AMBULATORY_CARE_PROVIDER_SITE_OTHER): Payer: Self-pay | Admitting: Family Medicine

## 2024-01-04 NOTE — Telephone Encounter (Signed)
 Shana with [facility] called inquiring about a RX refill for Vitamin D  they sent over, but have not heard back regarding. Jorge Newcomer would like a call back to [facility] [phone].

## 2024-01-27 ENCOUNTER — Encounter (INDEPENDENT_AMBULATORY_CARE_PROVIDER_SITE_OTHER): Payer: Self-pay | Admitting: Family Medicine

## 2024-01-27 ENCOUNTER — Ambulatory Visit (INDEPENDENT_AMBULATORY_CARE_PROVIDER_SITE_OTHER): Admitting: Family Medicine

## 2024-01-27 VITALS — BP 146/84 | HR 82 | Temp 98.3°F | Ht 66.0 in | Wt 183.0 lb

## 2024-01-27 DIAGNOSIS — I10 Essential (primary) hypertension: Secondary | ICD-10-CM | POA: Diagnosis not present

## 2024-01-27 DIAGNOSIS — E669 Obesity, unspecified: Secondary | ICD-10-CM

## 2024-01-27 DIAGNOSIS — Z6829 Body mass index (BMI) 29.0-29.9, adult: Secondary | ICD-10-CM | POA: Diagnosis not present

## 2024-01-27 NOTE — Assessment & Plan Note (Signed)
 BP elevated today.  She hasn't been taking her blood pressure medication regularly.  She realizes she needs to make a commitment to increase compliance. No chest pain, chest pressure or headache.

## 2024-01-27 NOTE — Progress Notes (Signed)
 SUBJECTIVE:  Chief Complaint: Obesity  Interim History: patient has had a stressful last 6 weeks.  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  Breakfast and lunch are on plan and dinner is grab and go.  Dinner is whatever is available.  May be a bag of chocolate covered pretzels, 6 mini donuts, chicken patty and multigrain sandwich thins.  She is looking for something fast for dinner.  She is packing an apartment, garage, and storage unit by herself.   Ashlee is here to discuss her progress with her obesity treatment plan. She is on the [employer] and states she is following her eating plan approximately 60 % of the time. She states she is not exercising 0 minutes 0 times per week.   OBJECTIVE: Visit Diagnoses: Problem List Items Addressed This Visit       Cardiovascular and Mediastinum   Hypertension - Primary   BP elevated today.  She hasn't been taking her blood pressure medication regularly.  She realizes she needs to make a commitment to increase compliance. No chest pain, chest pressure or headache.      Other Visit Diagnoses       Generalized obesity- Start BMI 30.67         BMI 29.0-29.9,adult Current BMI 29.1           Vitals Temp: 98.3 F (36.8 C) BP: (!) 146/84 Pulse Rate: 82 SpO2: 97 %   Anthropometric Measurements Height: 5' 6 (1.676 m) Weight: 183 lb (83 kg) BMI (Calculated): 29.55 Weight at Last Visit: 179lb Weight Lost Since Last Visit: 0lb Weight Gained Since Last Visit: 4lb Starting Weight: 190lb Total Weight Loss (lbs): 7 lb (3.175 kg) Peak Weight: 204lb   Body Composition  Body Fat %: 38.6 % Fat Mass (lbs): 70.6 lbs Muscle Mass (lbs): 106.8 lbs Total Body Water (lbs): 74.2 lbs Visceral Fat Rating : 10   Other Clinical Data Fasting: No Labs: No Today's Visit #: 8 Starting Date: 08/11/23     ASSESSMENT AND PLAN:  Diet: Rayn is currently in the action stage of change. As such, her  goal is to continue with weight loss efforts and has agreed to keeping a food journal and adhering to recommended goals of 400-450  calories and 35 or more grams protein at supper and the Pescatarian Plan.   Exercise:  For substantial health benefits, adults should do at least 150 minutes (2 hours and 30 minutes) a week of moderate-intensity, or 75 minutes (1 hour and 15 minutes) a week of vigorous-intensity aerobic physical activity, or an equivalent combination of moderate- and vigorous-intensity aerobic activity. Aerobic activity should be performed in episodes of at least 10 minutes, and preferably, it should be spread throughout the week. and For additional and more extensive health benefits, adults should increase their aerobic physical activity to 300 minutes (5 hours) a week of moderate-intensity, or 150 minutes a week of vigorous-intensity aerobic physical activity, or an equivalent combination of moderate- and vigorous-intensity activity. Additional health benefits are gained by engaging in physical activity beyond this amount.   Behavior Modification:  We discussed the following Behavioral Modification Strategies today: increasing lean protein intake, decreasing simple carbohydrates, increasing vegetables, meal planning and cooking strategies, and planning for success.   Return in about 4 weeks (around 02/24/2024).   She was informed of the importance of frequent follow up visits to maximize her success with intensive lifestyle modifications for her multiple health conditions.  Attestation  Statements:   Reviewed by clinician on day of visit: allergies, medications, problem list, medical history, surgical history, family history, social history, and previous encounter notes.     Adelita Cho, MD

## 2024-01-30 NOTE — Assessment & Plan Note (Deleted)
Encouraged increased hydration, 64 ounces of clear fluids daily. Minimize alcohol and caffeine. Eat small frequent meals with lean proteins and complex carbs. Avoid high and low blood sugars. Get adequate sleep, 7-8 hours a night. Needs exercise daily preferably in the morning.  

## 2024-01-30 NOTE — Assessment & Plan Note (Deleted)
Mildly elevated. She will monitor weekly BP and if any concerning symptoms.  no changes to meds. Encouraged heart healthy diet such as the DASH diet and exercise as tolerated.

## 2024-01-30 NOTE — Assessment & Plan Note (Deleted)
 Supplement and monitor

## 2024-01-30 NOTE — Assessment & Plan Note (Deleted)
 minimize simple carbs. Increase exercise as tolerated. Continue current meds

## 2024-01-31 ENCOUNTER — Ambulatory Visit: Payer: 59 | Admitting: Family Medicine

## 2024-01-31 DIAGNOSIS — E559 Vitamin D deficiency, unspecified: Secondary | ICD-10-CM

## 2024-01-31 DIAGNOSIS — R739 Hyperglycemia, unspecified: Secondary | ICD-10-CM

## 2024-01-31 DIAGNOSIS — I10 Essential (primary) hypertension: Secondary | ICD-10-CM

## 2024-01-31 DIAGNOSIS — G43809 Other migraine, not intractable, without status migrainosus: Secondary | ICD-10-CM

## 2024-02-01 NOTE — Progress Notes (Deleted)
 Subjective:     Patient ID: Megan May, female    DOB: Aug 12, 1960, 63 y.o.   MRN: [id number]  No chief complaint on file.   HPI  Megan May presents for follow-up chronic conditions  History of Present Illness           Hypertension Lisinopril  20 mg daily Spironolactone  25 mg daily  GERD Omeprazole  20 mg daily  Patient denies fever, chills, SOB, CP, palpitations, dyspnea, edema, HA, vision changes, N/V/D, abdominal pain, urinary symptoms, rash, weight changes, and recent illness or hospitalizations.       Health Maintenance Due  Topic Date Due   HIV Screening  Never done   Hepatitis C Screening  Never done   Zoster Vaccines- Shingrix (1 of 2) Never done   Cervical Cancer Screening (HPV/Pap Cotest)  11/21/2019    Past Medical History:  Diagnosis Date   Anxiety    Asthma    Chest pain    Depression    history of    Eosinophilic esophagitis    Fatigue 07/02/2017   Generalized anxiety disorder    High blood pressure    Hyperglycemia 03/15/2016   Hyperlipidemia, mixed 06/30/2016   Hypersomnia, recurrent    Hypertension    Joint pain    Lactose intolerance    Migraine    Migraines    NAFLD (nonalcoholic fatty liver disease)    Posttraumatic stress disorder    Shingles 11/03/2021   SOB (shortness of breath)    Swallowing difficulty    Vitamin D  deficiency     Past Surgical History:  Procedure Laterality Date   CESAREAN SECTION  2004   ESOPHAGOGASTRODUODENOSCOPY (EGD) WITH PROPOFOL  N/A 01/12/2019   Procedure: ESOPHAGOGASTRODUODENOSCOPY (EGD) WITH PROPOFOL ;  Surgeon: Rollin Dover, MD;  Location: [facility];  Service: Endoscopy;  Laterality: N/A;   FRACTURE SURGERY  1973   R arm   LAPAROTOMY Bilateral 06/14/2014   Procedure: LAPAROTOMY with right  salpingo-oophorectomy and left salpingectomy;  Surgeon: Duwaine Blumenthal, DO;  Location: [facility];  Service: Gynecology;  Laterality: Bilateral;   TONSILLECTOMY AND ADENOIDECTOMY  1969     Family History  Problem Relation Age of Onset   Cancer Mother    Kidney disease Mother    Hypertension Mother    Obesity Mother    Obesity Father    Cancer Father    Hyperlipidemia Father    Hypertension Father    Heart disease Father    Diabetes Father    CAD Father    Kidney disease Father    Anxiety disorder Father    Diabetes Brother    Stroke Maternal Grandmother    Stroke Maternal Grandfather    Diabetes Paternal Grandmother    Other Cousin        bicuspid aortic valve    Social History   Socioeconomic History   Marital status: Married    Spouse name: Camellia   Number of children: 1   Years of education: 2 Masters   Highest education level: Manufacturing engineer (e.g., MA, MS, MEng, MEd, MSW, MBA)  Occupational History   Occupation: SPECIAL EDUCATOR    Employer: [employer]    Comment: Administrator  Tobacco Use   Smoking status: Never   Smokeless tobacco: Never  Substance and Sexual Activity   Alcohol use: No   Drug use: No   Sexual activity: Yes    Birth control/protection: Surgical  Other Topics Concern   Not on file  Social History Narrative  Patient is right handed, resides in home with husband and daughter. She consumes 16 oz of tea daily.   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   Social Drivers of Health   Financial Resource Strain: Medium Risk (01/31/2024)   Overall Financial Resource Strain (CARDIA)    Difficulty of Paying Living Expenses: Somewhat hard  Food Insecurity: No Food Insecurity (01/31/2024)   Hunger Vital Sign    Worried About Running Out of Food in the Last Year: Never true    Ran Out of Food in the Last Year: Never true  Transportation Needs: No Transportation Needs (01/31/2024)   PRAPARE - Administrator, Civil Service (Medical): No    Lack of Transportation (Non-Medical): No  Physical Activity: Insufficiently Active (01/31/2024)   Exercise Vital Sign    Days of Exercise per Week: 2 days     Minutes of Exercise per Session: 20 min  Stress: Stress Concern Present (01/31/2024)   [employer] of Occupational Health - Occupational Stress Questionnaire    Feeling of Stress: To some extent  Social Connections: Moderately Isolated (01/31/2024)   Social Connection and Isolation Panel    Frequency of Communication with Friends and Family: More than three times a week    Frequency of Social Gatherings with Friends and Family: Once a week    Attends Religious Services: Never    Database administrator or Organizations: No    Attends Engineer, structural: Not on file    Marital Status: Married  Intimate Partner Violence: Patient Declined (09/24/2023)   Humiliation, Afraid, Rape, and Kick questionnaire    Fear of Current or Ex-Partner: Patient declined    Emotionally Abused: Patient declined    Physically Abused: Patient declined    Sexually Abused: Patient declined    Outpatient Medications Prior to Visit  Medication Sig Dispense Refill   Fluticasone  Furoate-Vilanterol (BREO ELLIPTA) 50-25 MCG/ACT AEPB Inhale into the lungs.     lisinopril  (ZESTRIL ) 20 MG tablet Take 1 tablet (20 mg total) by mouth daily. 90 tablet 3   meloxicam (MOBIC) 15 MG tablet Take 15 mg by mouth daily.     omeprazole  (PRILOSEC) 20 MG capsule TAKE 1 CAPSULE(20 MG) BY MOUTH DAILY 30 capsule 3   spironolactone  (ALDACTONE ) 25 MG tablet Take 25 mg by mouth daily as needed (swelling).     Vitamin D , Ergocalciferol , (DRISDOL ) 1.25 MG (50000 UNIT) CAPS capsule Take 1 capsule (50,000 Units total) by mouth every 7 (seven) days. 8 capsule 0   No facility-administered medications prior to visit.    Allergies  Allergen Reactions   Amoxicillin-Pot Clavulanate Rash   Doxycycline  Other (See Comments)    headache   Levaquin  [Levofloxacin  In D5w] Other (See Comments)    tendonopathy   Clindamycin  Itching   Clindamycin /Lincomycin Itching   Lactose Intolerance (Gi) Nausea Only   Metoprolol      Weight  gain/fluid retention    Penicillin G Other (See Comments)   Aspirin  Other (See Comments)    Migraine headache   Latex Rash   Penicillins Swelling and Rash    Did it involve swelling of the face/tongue/throat, SOB, or low BP? No Did it involve sudden or severe rash/hives, skin peeling, or any reaction on the inside of your mouth or nose? Yes Did you need to seek medical attention at a hospital or doctor's office? No When did it last happen?      63 yo If all above answers are "NO", may  proceed with cephalosporin use.    ROS     Objective:    Physical Exam   LMP 05/10/2014 (Approximate)  Wt Readings from Last 3 Encounters:  01/27/24 183 lb (83 kg)  12/16/23 179 lb (81.2 kg)  11/08/23 179 lb (81.2 kg)       Assessment & Plan:   Problem List Items Addressed This Visit   None   I am having Megan May maintain her lisinopril , spironolactone , Breo Ellipta, meloxicam, Vitamin D  (Ergocalciferol ), and omeprazole .  No orders of the defined types were placed in this encounter.

## 2024-02-02 ENCOUNTER — Ambulatory Visit: Admitting: Student

## 2024-02-29 ENCOUNTER — Encounter (INDEPENDENT_AMBULATORY_CARE_PROVIDER_SITE_OTHER): Payer: Self-pay | Admitting: Family Medicine

## 2024-02-29 ENCOUNTER — Ambulatory Visit (INDEPENDENT_AMBULATORY_CARE_PROVIDER_SITE_OTHER): Admitting: Family Medicine

## 2024-02-29 VITALS — BP 135/71 | HR 85 | Temp 98.5°F | Ht 66.0 in | Wt 187.0 lb

## 2024-02-29 DIAGNOSIS — E669 Obesity, unspecified: Secondary | ICD-10-CM | POA: Diagnosis not present

## 2024-02-29 DIAGNOSIS — Z6830 Body mass index (BMI) 30.0-30.9, adult: Secondary | ICD-10-CM

## 2024-02-29 DIAGNOSIS — E559 Vitamin D deficiency, unspecified: Secondary | ICD-10-CM | POA: Diagnosis not present

## 2024-02-29 MED ORDER — VITAMIN D (ERGOCALCIFEROL) 1.25 MG (50000 UNIT) PO CAPS: 50000.0000 [IU] | ORAL_CAPSULE | ORAL | 0 refills | Status: DC

## 2024-02-29 NOTE — Progress Notes (Unsigned)
   SUBJECTIVE:  Chief Complaint: Obesity  Interim History: Patient has been moving for the entirety of the month of July.  She has been been focused on this.  Foodwise she is just surviving- she may have yogurt or cheese stick or legendary poptarts.  Lunch may be almond crackers and handful of peanuts and dinner is premixed salad.  Previously she was doing [employer] and she started feeling guilty eating that so many nights for dinner.  Thinks she has about 2 more weeks of moving left.  Megan May is here to discuss her progress with her obesity treatment plan. She is on the [employer] and states she is following her eating plan approximately 25 % of the time. She states she is walking the dog and moving houses.  OBJECTIVE: Visit Diagnoses: Problem List Items Addressed This Visit       Other   Vitamin D  deficiency - Primary   Relevant Medications   Vitamin D , Ergocalciferol , (DRISDOL ) 1.25 MG (50000 UNIT) CAPS capsule   Other Visit Diagnoses       Generalized obesity- Start BMI 30.67         BMI 30.0-30.9,adult           Vitals Temp: 98.5 F (36.9 C) BP: 135/71 Pulse Rate: 85 SpO2: 98 %   Anthropometric Measurements Height: 5' 6 (1.676 m) Weight: 187 lb (84.8 kg) BMI (Calculated): 30.2 Weight at Last Visit: 183 lb Weight Lost Since Last Visit: 0 Weight Gained Since Last Visit: 4 Starting Weight: 190 lb Total Weight Loss (lbs): 3 lb (1.361 kg)   Body Composition  Body Fat %: 42.5 % Fat Mass (lbs): 79.4 lbs Muscle Mass (lbs): 102.2 lbs Total Body Water (lbs): 72.6 lbs Visceral Fat Rating : 11   Other Clinical Data Today's Visit #: 9 Starting Date: 08/11/23 Comments: Pesc, 400-450/35     ASSESSMENT AND PLAN: Assessment & Plan Vitamin D  deficiency Patient is tolerating prescription strength Vitamin D  without issue.  Needs a refill today- no nausea, vomiting or muscle weakness mentioned. Generalized obesity- Start BMI 30.67  BMI  30.0-30.9,adult    Diet: Megan May is currently in the action stage of change. As such, her goal is to continue with weight loss efforts and has agreed to keeping a food journal and adhering to recommended goals of 400-450 calories and 35 or more grams protein and the Pescatarian Plan.   Exercise:  For substantial health benefits, adults should do at least 150 minutes (2 hours and 30 minutes) a week of moderate-intensity, or 75 minutes (1 hour and 15 minutes) a week of vigorous-intensity aerobic physical activity, or an equivalent combination of moderate- and vigorous-intensity aerobic activity. Aerobic activity should be performed in episodes of at least 10 minutes, and preferably, it should be spread throughout the week.  Behavior Modification:  We discussed the following Behavioral Modification Strategies today: increasing lean protein intake, decreasing simple carbohydrates, increasing vegetables, meal planning and cooking strategies, and planning for success.   Return in about 5 weeks (around 04/04/2024).   She was informed of the importance of frequent follow up visits to maximize her success with intensive lifestyle modifications for her multiple health conditions.  Attestation Statements:   Reviewed by clinician on day of visit: allergies, medications, problem list, medical history, surgical history, family history, social history, and previous encounter notes.     Adelita Cho, MD

## 2024-03-02 NOTE — Assessment & Plan Note (Signed)
 Patient is tolerating prescription strength Vitamin D  without issue.  Needs a refill today- no nausea, vomiting or muscle weakness mentioned.

## 2024-03-29 ENCOUNTER — Ambulatory Visit (INDEPENDENT_AMBULATORY_CARE_PROVIDER_SITE_OTHER): Admitting: Family Medicine

## 2024-03-29 ENCOUNTER — Encounter (INDEPENDENT_AMBULATORY_CARE_PROVIDER_SITE_OTHER): Payer: Self-pay | Admitting: Family Medicine

## 2024-03-29 VITALS — BP 132/77 | HR 71 | Temp 98.2°F | Ht 66.0 in | Wt 185.0 lb

## 2024-03-29 DIAGNOSIS — E88819 Insulin resistance, unspecified: Secondary | ICD-10-CM

## 2024-03-29 DIAGNOSIS — E559 Vitamin D deficiency, unspecified: Secondary | ICD-10-CM

## 2024-03-29 DIAGNOSIS — E669 Obesity, unspecified: Secondary | ICD-10-CM

## 2024-03-29 DIAGNOSIS — Z6829 Body mass index (BMI) 29.0-29.9, adult: Secondary | ICD-10-CM

## 2024-03-29 DIAGNOSIS — E782 Mixed hyperlipidemia: Secondary | ICD-10-CM

## 2024-03-29 DIAGNOSIS — I10 Essential (primary) hypertension: Secondary | ICD-10-CM

## 2024-03-29 MED ORDER — LISINOPRIL 20 MG PO TABS: 20.0000 mg | ORAL_TABLET | Freq: Every day | ORAL | 0 refills | Status: DC

## 2024-03-29 NOTE — Progress Notes (Signed)
 SUBJECTIVE:  Chief Complaint: Obesity  Interim History: For the last two weeks she has been committing to the [employer] for dinner.  She has prepped breakfasts for the week.  She mentions lunch is simple- vegetarian chicken and multi grain bread.  Instead of snacking she is doing the [employer] protein balls.  She has also limited her intake to between 11am-7pm.  She has CSR that isn't going away but and has been taking Cort-Ease; has been helping with her ruminating. Patient feels hunger is more satisfied now.  She is feeling like she can make the decision if she wants to eat or not eat. No plans for Labor Day weekend.  She is finding the meal planning and prepping to be making life easier for her.  Megan May is here to discuss her progress with her obesity treatment plan. She is on the [employer] and states she is following her eating plan approximately 50 % of the time. She states she is walking some.   OBJECTIVE: Visit Diagnoses: Problem List Items Addressed This Visit   None   Vitals Temp: 98.2 F (36.8 C) BP: 132/77 Pulse Rate: 71 SpO2: 99 %   Anthropometric Measurements Height: 5' 6 (1.676 m) Weight: 185 lb (83.9 kg) BMI (Calculated): 29.87 Weight at Last Visit: 187 lb Weight Lost Since Last Visit: 2 Weight Gained Since Last Visit: 0 Starting Weight: 190 lb Total Weight Loss (lbs): 5 lb (2.268 kg)   Body Composition  Body Fat %: 39.2 % Fat Mass (lbs): 72.6 lbs Muscle Mass (lbs): 106.8 lbs Total Body Water (lbs): 72.8 lbs Visceral Fat Rating : 10   Other Clinical Data Today's Visit #: 10 Starting Date: 08/11/23 Comments: Pesc     ASSESSMENT AND PLAN: Assessment & Plan Primary hypertension Blood pressure well-controlled today.  Patient needs a refill of lisinopril  20 mg daily.  Will repeat CMP today to ensure no effective kidney function as well as electrolytes are within normal limits. Hyperlipidemia, mixed Patient is fasting for  a repeat cholesterol panel today. Prior  LDL of 153 HDL 76 and triglycerides 88.  Patient has been working on dietary changes and lifestyle modifications. Vitamin D  deficiency Previous vitamin D  level is 22.5 in January of this year.  No nausea, vomiting, or muscle weakness reported.  Repeat vitamin D  level today. Insulin  resistance Elevated insulin  level on previous labs with normal A1c level.  Patient has been consistent in mindful eating and trying to limit simple carbohydrates. Generalized obesity- Start BMI 30.67  BMI 29.0-29.9,adult Current BMI 29.1    Diet: Trinitey is currently in the action stage of change. As such, her goal is to continue with weight loss efforts and has agreed to the [employer].   Exercise:  For additional and more extensive health benefits, adults should increase their aerobic physical activity to 300 minutes (5 hours) a week of moderate-intensity, or 150 minutes a week of vigorous-intensity aerobic physical activity, or an equivalent combination of moderate- and vigorous-intensity activity. Additional health benefits are gained by engaging in physical activity beyond this amount.  Wants to move more as tolerated  Behavior Modification:  We discussed the following Behavioral Modification Strategies today: increasing lean protein intake, decreasing simple carbohydrates, increasing vegetables, meal planning and cooking strategies, and planning for success.   Return in about 4 weeks (around 04/26/2024).   She was informed of the importance of frequent follow up visits to maximize her success with intensive lifestyle modifications for her  multiple health conditions.  Attestation Statements:   Reviewed by clinician on day of visit: allergies, medications, problem list, medical history, surgical history, family history, social history, and previous encounter notes.     Adelita Cho, MD

## 2024-03-30 LAB — LIPID PANEL WITH LDL/HDL RATIO
Cholesterol, Total: 211 mg/dL — ABNORMAL HIGH (ref 100–199)
HDL: 60 mg/dL (ref >39)
LDL Chol Calc (NIH): 139 mg/dL — ABNORMAL HIGH (ref 0–99)
LDL/HDL Ratio: 2.3 ratio (ref 0.0–3.2)
Triglycerides: 67 mg/dL (ref 0–149)
VLDL Cholesterol Cal: 12 mg/dL (ref 5–40)

## 2024-03-30 LAB — COMPREHENSIVE METABOLIC PANEL WITH GFR
ALT: 15 IU/L (ref 0–32)
AST: 19 IU/L (ref 0–40)
Albumin: 4.5 g/dL (ref 3.9–4.9)
Alkaline Phosphatase: 88 IU/L (ref 44–121)
BUN/Creatinine Ratio: 19 (ref 12–28)
BUN: 16 mg/dL (ref 8–27)
Bilirubin Total: 0.9 mg/dL (ref 0.0–1.2)
CO2: 21 mmol/L (ref 20–29)
Calcium: 9.7 mg/dL (ref 8.7–10.3)
Chloride: 105 mmol/L (ref 96–106)
Creatinine, Ser: 0.83 mg/dL (ref 0.57–1.00)
Globulin, Total: 2 g/dL (ref 1.5–4.5)
Glucose: 95 mg/dL (ref 70–99)
Potassium: 4.2 mmol/L (ref 3.5–5.2)
Sodium: 141 mmol/L (ref 134–144)
Total Protein: 6.5 g/dL (ref 6.0–8.5)
eGFR: 80 mL/min/1.73 (ref >59)

## 2024-03-30 LAB — HEMOGLOBIN A1C
Est. average glucose Bld gHb Est-mCnc: 117 mg/dL
Hgb A1c MFr Bld: 5.7 % — ABNORMAL HIGH (ref 4.8–5.6)

## 2024-03-30 LAB — INSULIN, RANDOM: INSULIN: 12 u[IU]/mL (ref 2.6–24.9)

## 2024-03-30 LAB — VITAMIN D 25 HYDROXY (VIT D DEFICIENCY, FRACTURES): Vit D, 25-Hydroxy: 27.2 ng/mL — ABNORMAL LOW (ref 30.0–100.0)

## 2024-04-05 NOTE — Assessment & Plan Note (Signed)
 Elevated insulin  level on previous labs with normal A1c level.  Patient has been consistent in mindful eating and trying to limit simple carbohydrates.

## 2024-04-05 NOTE — Assessment & Plan Note (Signed)
 Previous vitamin D  level is 22.5 in January of this year.  No nausea, vomiting, or muscle weakness reported.  Repeat vitamin D  level today.

## 2024-04-05 NOTE — Assessment & Plan Note (Signed)
 Patient is fasting for a repeat cholesterol panel today. Prior  LDL of 153 HDL 76 and triglycerides 88.  Patient has been working on dietary changes and lifestyle modifications.

## 2024-04-26 ENCOUNTER — Encounter (INDEPENDENT_AMBULATORY_CARE_PROVIDER_SITE_OTHER): Payer: Self-pay | Admitting: Family Medicine

## 2024-04-26 ENCOUNTER — Other Ambulatory Visit (INDEPENDENT_AMBULATORY_CARE_PROVIDER_SITE_OTHER): Payer: Self-pay | Admitting: Family Medicine

## 2024-04-26 ENCOUNTER — Ambulatory Visit (INDEPENDENT_AMBULATORY_CARE_PROVIDER_SITE_OTHER): Admitting: Family Medicine

## 2024-04-26 VITALS — BP 164/73 | HR 65 | Temp 98.4°F | Ht 66.0 in | Wt 186.0 lb

## 2024-04-26 DIAGNOSIS — I10 Essential (primary) hypertension: Secondary | ICD-10-CM

## 2024-04-26 DIAGNOSIS — E669 Obesity, unspecified: Secondary | ICD-10-CM | POA: Diagnosis not present

## 2024-04-26 DIAGNOSIS — Z6830 Body mass index (BMI) 30.0-30.9, adult: Secondary | ICD-10-CM | POA: Diagnosis not present

## 2024-04-26 DIAGNOSIS — E782 Mixed hyperlipidemia: Secondary | ICD-10-CM

## 2024-04-26 MED ORDER — TOPIRAMATE 25 MG PO TABS: ORAL_TABLET | ORAL | 0 refills | Status: DC

## 2024-04-26 MED ORDER — LOMAIRA 8 MG PO TABS: ORAL_TABLET | ORAL | 0 refills | Status: DC

## 2024-04-26 NOTE — Assessment & Plan Note (Addendum)
 Patient has been monitoring her blood pressure and she is averaging 130/69 over the last month.  She is stressed and upset today.  No chest pain, chest pressure or headache.  No change in current treatment- will watch BP with starting phentermine .

## 2024-04-26 NOTE — Progress Notes (Signed)
 SUBJECTIVE:  Chief Complaint: Obesity  Interim History: Patient is feeling hungry mid afternoon.  She feels like anytime she eats anything she feels like she blows up. She feels frustrated and is eating on plan.  Dinner time she is normally doing [employer] meal prep.  She is crashing around 3:30 and is eating anything that is accessible.  She is open to medication options.   Megan May is here to discuss her progress with her obesity treatment plan. She is on the [employer] and states she is following her eating plan approximately  75% of the time. She states she is walking.  OBJECTIVE: Visit Diagnoses: Problem List Items Addressed This Visit       Cardiovascular and Mediastinum   Essential hypertension, benign   Patient has been monitoring her blood pressure and she is averaging 130/69 over the last month.  She is stressed and upset today.  No chest pain, chest pressure or headache.  No change in current treatment- will watch BP with starting phentermine .        Other   Hyperlipidemia, mixed   Other Visit Diagnoses       Generalized obesity- Start BMI 30.67    -  Primary   Relevant Medications   Phentermine  HCl (LOMAIRA ) 8 MG TABS   topiramate  (TOPAMAX ) 25 MG tablet     Primary hypertension         BMI 30.0-30.9,adult           Vitals Temp: 98.4 F (36.9 C) BP: (!) 164/73 Pulse Rate: 65 SpO2: 99 %   Anthropometric Measurements Height: 5' 6 (1.676 m) Weight: 186 lb (84.4 kg) BMI (Calculated): 30.04 Weight at Last Visit: 185 lb Weight Lost Since Last Visit: 0 Weight Gained Since Last Visit: 1 Starting Weight: 190 lb Total Weight Loss (lbs): 4 lb (1.814 kg)   Body Composition  Body Fat %: 39.3 % Fat Mass (lbs): 73.4 lbs Muscle Mass (lbs): 107.6 lbs Total Body Water (lbs): 71.8 lbs Visceral Fat Rating : 10   Other Clinical Data Today's Visit #: 11 Starting Date: 08/11/23 Comments: Pesc     ASSESSMENT AND PLAN: Assessment & Plan Primary  hypertension Blood pressure elevated today but patient is under quite a bit of familial and home life stress.  Denies any chest pain, chest pressure, or headache.  We discussed impact of stimulant usage on blood pressure and patient will need close follow-up to ensure that phentermine  is not increasing her blood pressure.  She reports no previously elevated blood pressures recently.No current change in treatment plan. Hyperlipidemia, mixed The 10-year ASCVD risk score (Arnett DK, et al., 2019) is: 8.7%   Values used to calculate the score:     Age: 63 years     Clincally relevant sex: Female     Is Non-Hispanic African American: No     Diabetic: No     Tobacco smoker: No     Systolic Blood Pressure: 164 mmHg     Is BP treated: Yes     HDL Cholesterol: 60 mg/dL     Total Cholesterol: 211 mg/dL Discussed lab results with patient today and she would like to work on dietary changes and lifestyle changes.  We discussed risks of medication difference and patient would like to wait until next lab draw prior to starting medication.  I of note her LDL has come down and her HDL is still above goal. Generalized obesity- Start BMI 30.67 Medication options were discussed extensively with patient  today.  Given availability, cost, insurance coverage and other medical comorbidities the decision was reached to start medications Lomaira  and Topiramate .  These medications will be used as a substitute for brand name Qsymia in doses that are synanomous.  Patient understands this is an off label usage.  We discussed the titration schedule with the goal of 5% weight loss at 3 months at a treatment dose.  The first two weeks will be a starting dose of 25mg  of Topiramate  and 4mg  of Lomaira .  After two weeks the patient will increase to 50mg  of Topiramate  and 8mg  of Lomaira  and will stay on this dose until the next appointment.  Controlled substance contract was discussed and signed today.  Prescriptions sent in.   BMI  30.0-30.9,adult  Essential hypertension, benign Patient has been monitoring her blood pressure and she is averaging 130/69 over the last month.  She is stressed and upset today.  No chest pain, chest pressure or headache.  No change in current treatment- will watch BP with starting phentermine .   Diet: Megan May is currently in the action stage of change. As such, her goal is to continue with weight loss efforts and has agreed to the [employer].   Exercise:  All adults should avoid inactivity. Some activity is better than none, and adults who participate in any amount of physical activity, gain some health benefits.  Behavior Modification:  We discussed the following Behavioral Modification Strategies today: increasing lean protein intake, decreasing simple carbohydrates, increasing vegetables, meal planning and cooking strategies, keeping healthy foods in the home, and planning for success. We discussed various medication options to help [facility] with her weight loss efforts and we both agreed to start makeshift qysmia as above..  Return in about 4 weeks (around 05/24/2024).   She was informed of the importance of frequent follow up visits to maximize her success with intensive lifestyle modifications for her multiple health conditions.  Attestation Statements:   Reviewed by clinician on day of visit: allergies, medications, problem list, medical history, surgical history, family history, social history, and previous encounter notes.     Megan Cho, MD

## 2024-04-30 NOTE — Assessment & Plan Note (Signed)
 The 10-year ASCVD risk score (Arnett DK, et al., 2019) is: 8.7%   Values used to calculate the score:     Age: 63 years     Clincally relevant sex: Female     Is Non-Hispanic African American: No     Diabetic: No     Tobacco smoker: No     Systolic Blood Pressure: 164 mmHg     Is BP treated: Yes     HDL Cholesterol: 60 mg/dL     Total Cholesterol: 211 mg/dL Discussed lab results with patient today and she would like to work on dietary changes and lifestyle changes.  We discussed risks of medication difference and patient would like to wait until next lab draw prior to starting medication.  I of note her LDL has come down and her HDL is still above goal.

## 2024-05-21 ENCOUNTER — Other Ambulatory Visit (INDEPENDENT_AMBULATORY_CARE_PROVIDER_SITE_OTHER): Payer: Self-pay | Admitting: Family Medicine

## 2024-05-21 DIAGNOSIS — E669 Obesity, unspecified: Secondary | ICD-10-CM

## 2024-05-24 ENCOUNTER — Encounter (INDEPENDENT_AMBULATORY_CARE_PROVIDER_SITE_OTHER): Payer: Self-pay | Admitting: Family Medicine

## 2024-05-24 ENCOUNTER — Other Ambulatory Visit (INDEPENDENT_AMBULATORY_CARE_PROVIDER_SITE_OTHER): Payer: Self-pay | Admitting: Family Medicine

## 2024-05-24 ENCOUNTER — Ambulatory Visit (INDEPENDENT_AMBULATORY_CARE_PROVIDER_SITE_OTHER): Admitting: Family Medicine

## 2024-05-24 VITALS — BP 120/74 | HR 70 | Temp 98.1°F | Ht 66.0 in | Wt 180.0 lb

## 2024-05-24 DIAGNOSIS — E782 Mixed hyperlipidemia: Secondary | ICD-10-CM | POA: Diagnosis not present

## 2024-05-24 DIAGNOSIS — E559 Vitamin D deficiency, unspecified: Secondary | ICD-10-CM | POA: Diagnosis not present

## 2024-05-24 DIAGNOSIS — E669 Obesity, unspecified: Secondary | ICD-10-CM

## 2024-05-24 DIAGNOSIS — R7303 Prediabetes: Secondary | ICD-10-CM

## 2024-05-24 DIAGNOSIS — Z6829 Body mass index (BMI) 29.0-29.9, adult: Secondary | ICD-10-CM

## 2024-05-24 MED ORDER — TOPIRAMATE 50 MG PO TABS
50.0000 mg | ORAL_TABLET | Freq: Every day | ORAL | 0 refills | Status: DC
Start: 2024-05-24 — End: 2024-06-21

## 2024-05-24 MED ORDER — PHENTERMINE HCL 8 MG PO TABS: 8.0000 mg | ORAL_TABLET | Freq: Every day | ORAL | 0 refills | Status: DC

## 2024-05-24 NOTE — Progress Notes (Signed)
 SUBJECTIVE:  Chief Complaint: Obesity  Interim History: Patient voices her husband has had numerous medical emergencies and had surgery yesterday, Megan May has an upcoming surgery. She started combination medication of phentermine  and topiramate .  She is not having the same word finding difficulties she did previously on that medication.  She is just concentrating on getting protein in and keep moving.  She does find that she can ignore sugar but is finding her irritability is increased.  She is relieved that the internal food rumination has improved and she has been significantly more efficient at work. She took a break from [employer].  Haydon is here to discuss her progress with her obesity treatment plan. She is on the [employer] and states she is following her eating plan approximately 90 % of the time. She states she is walking 10-15 minutes 7 times per week.   OBJECTIVE: Visit Diagnoses: Problem List Items Addressed This Visit       Other   Hyperlipidemia, mixed   Vitamin D  deficiency - Primary   Other Visit Diagnoses       Prediabetes         BMI 29.0-29.9,adult Current BMI 29.1         Generalized obesity- Start BMI 30.67       Relevant Medications   Phentermine  HCl (LOMAIRA ) 8 MG TABS   topiramate  (TOPAMAX ) 50 MG tablet       Vitals Temp: 98.1 F (36.7 C) BP: 120/74 Pulse Rate: 70 SpO2: 99 %   Anthropometric Measurements Height: 5' 6 (1.676 m) Weight: 180 lb (81.6 kg) BMI (Calculated): 29.07 Weight at Last Visit: 186 lb Weight Lost Since Last Visit: 6 Weight Gained Since Last Visit: 0 Starting Weight: 190 lb Total Weight Loss (lbs): 10 lb (4.536 kg)   Body Composition  Body Fat %: 28.7 % Fat Mass (lbs): 51.8 lbs Muscle Mass (lbs): 122.4 lbs Total Body Water (lbs): 71.2 lbs Visceral Fat Rating : 8   Other Clinical Data Fasting: no Labs: no Today's Visit #: 12 Starting Date: 08/11/23 Comments: Pesc     ASSESSMENT AND  PLAN: Assessment & Plan Vitamin D  deficiency Slight increase in vitamin D  lab level on last labs.  Needs to continue prescription strength vitamin D  every week. Prediabetes A1c increased from 5.4-5.7 from labs done in January to labs done in August.  Patient has had significant life stressors which has made following meal plan difficult.  Discussed limiting simple carbohydrates and focusing on protein intake.  Will need repeat labs in January 2026.  Patient has now started on obesity controlling medications. Hyperlipidemia, mixed The 10-year ASCVD risk score (Arnett DK, et al., 2019) is: 4.7%   Values used to calculate the score:     Age: 63 years     Clincally relevant sex: Female     Is Non-Hispanic African American: No     Diabetic: No     Tobacco smoker: No     Systolic Blood Pressure: 120 mmHg     Is BP treated: Yes     HDL Cholesterol: 60 mg/dL     Total Cholesterol: 211 mg/dL Defer cholesterol altering medication at this time.  Will need to repeat cholesterol panel in the next 4 months.  If risks has not decreased we will discuss further lifestyle modifications that need to ensue. BMI 29.0-29.9,adult Current BMI 29.1  Generalized obesity- Start BMI 30.67 While patient has had some side effects since starting the combination of phentermine  and  topiramate  she reports a significant increase in her control of emotional eating and snacking.  Will continue current dose of phentermine  and topiramate  until next appointment and then we will reevaluate for side effects.  PDMP checked with no concerns.  Starting weight of 186 pounds on April 26, 2024.  In order to achieve 5% weight loss patient needs to lose 9 pounds by January.  She has 3 pounds left to lose in order to achieve this.   Diet: Naryah is currently in the action stage of change. As such, her goal is to continue with weight loss efforts and has agreed to the [employer].   Exercise:  All adults should avoid inactivity.  Some activity is better than none, and adults who participate in any amount of physical activity, gain some health benefits.  Behavior Modification:  We discussed the following Behavioral Modification Strategies today: increasing lean protein intake, decreasing simple carbohydrates, meal planning and cooking strategies, avoiding temptations, and planning for success. We discussed various medication options to help Janalyn with her weight loss efforts and we both agreed to continue lomaira  and topiramate  at current dose of 8mg / 50mg .  No follow-ups on file.   She was informed of the importance of frequent follow up visits to maximize her success with intensive lifestyle modifications for her multiple health conditions.  Attestation Statements:   Reviewed by clinician on day of visit: allergies, medications, problem list, medical history, surgical history, family history, social history, and previous encounter notes.    Adelita Cho, MD

## 2024-05-24 NOTE — Assessment & Plan Note (Addendum)
 The 10-year ASCVD risk score (Arnett DK, et al., 2019) is: 4.7%   Values used to calculate the score:     Age: 63 years     Clincally relevant sex: Female     Is Non-Hispanic African American: No     Diabetic: No     Tobacco smoker: No     Systolic Blood Pressure: 120 mmHg     Is BP treated: Yes     HDL Cholesterol: 60 mg/dL     Total Cholesterol: 211 mg/dL Defer cholesterol altering medication at this time.  Will need to repeat cholesterol panel in the next 4 months.  If risks has not decreased we will discuss further lifestyle modifications that need to ensue.

## 2024-06-02 NOTE — Assessment & Plan Note (Signed)
 Slight increase in vitamin D  lab level on last labs.  Needs to continue prescription strength vitamin D  every week.

## 2024-06-07 ENCOUNTER — Ambulatory Visit (INDEPENDENT_AMBULATORY_CARE_PROVIDER_SITE_OTHER): Admitting: Family Medicine

## 2024-06-21 ENCOUNTER — Ambulatory Visit (INDEPENDENT_AMBULATORY_CARE_PROVIDER_SITE_OTHER): Payer: Self-pay | Admitting: Family Medicine

## 2024-06-21 VITALS — BP 129/85 | HR 72 | Temp 97.8°F | Ht 66.0 in | Wt 179.0 lb

## 2024-06-21 DIAGNOSIS — E669 Obesity, unspecified: Secondary | ICD-10-CM | POA: Diagnosis not present

## 2024-06-21 DIAGNOSIS — Z6828 Body mass index (BMI) 28.0-28.9, adult: Secondary | ICD-10-CM | POA: Diagnosis not present

## 2024-06-21 DIAGNOSIS — R102 Pelvic and perineal pain unspecified side: Secondary | ICD-10-CM | POA: Diagnosis not present

## 2024-06-21 MED ORDER — TOPIRAMATE 50 MG PO TABS: 50.0000 mg | ORAL_TABLET | Freq: Every day | ORAL | 0 refills | Status: DC

## 2024-06-21 MED ORDER — PHENTERMINE HCL 8 MG PO TABS: 8.0000 mg | ORAL_TABLET | Freq: Every day | ORAL | 0 refills | Status: DC

## 2024-06-21 NOTE — Progress Notes (Signed)
 SUBJECTIVE:  Chief Complaint: Obesity  Interim History: Patient has been eating mostly protein over the last month.  She is feeling somewhat disappointed with only losing 1lb.  She is feeling better with the 179 on the scale.  She ignored candy for Halloween.  She is finding that she is thinking more about food overall.  She found a new vegetarian food she likes. She is asking about micro dosing GLP1 medications.  Megan May is here to discuss her progress with her obesity treatment plan. She is on the [facility] and is getting 90 grams of protein and states she is following her eating plan approximately 100 % of the time. She states she is walking 10-15 minutes 7 times per week.   OBJECTIVE: Visit Diagnoses: Problem List Items Addressed This Visit   None Visit Diagnoses       Pelvic pain    -  Primary   Relevant Orders   Ambulatory referral to Urogynecology     Generalized obesity- Start BMI 30.67       Relevant Medications   Phentermine  HCl (LOMAIRA ) 8 MG TABS   topiramate  (TOPAMAX ) 50 MG tablet     BMI 28.0-28.9,adult           Vitals Temp: 97.8 F (36.6 C) BP: 129/85 Pulse Rate: 72 SpO2: 100 %   Anthropometric Measurements Height: 5' 6 (1.676 m) Weight: 179 lb (81.2 kg) BMI (Calculated): 28.91 Weight at Last Visit: 180 lb Weight Lost Since Last Visit: 1 Weight Gained Since Last Visit: 0 Starting Weight: 190 lb Total Weight Loss (lbs): 11 lb (4.99 kg)   Body Composition  Body Fat %: 39.2 % Fat Mass (lbs): 70.2 lbs Muscle Mass (lbs): 103.4 lbs Total Body Water (lbs): 69.6 lbs Visceral Fat Rating : 10   Other Clinical Data Fasting: yes Labs: no Today's Visit #: 13 Starting Date: 08/11/23 Comments: Pesc     ASSESSMENT AND PLAN: Assessment & Plan Pelvic pain Patient is experiencing pretty consistent chronic pelvic pain and that has not had workup from GYN.  She is interested in pursuing evaluation by urogynecology.  Will refer to urogynecology  for further assessment.  From patient's symptoms it sounds like there may be either bladder or uterine prolapse. BMI 28.0-28.9,adult  Generalized obesity- Start BMI 30.67    Diet: Megan May is currently in the action stage of change. As such, her goal is to continue with weight loss efforts and has agreed to keeping a food journal and adhering to recommended goals of 1100-1200 calories and 85 or more grams of protein.   Exercise:  For substantial health benefits, adults should do at least 150 minutes (2 hours and 30 minutes) a week of moderate-intensity, or 75 minutes (1 hour and 15 minutes) a week of vigorous-intensity aerobic physical activity, or an equivalent combination of moderate- and vigorous-intensity aerobic activity. Aerobic activity should be performed in episodes of at least 10 minutes, and preferably, it should be spread throughout the week.  Behavior Modification:  We discussed the following Behavioral Modification Strategies today: increasing lean protein intake, decreasing simple carbohydrates, increasing vegetables, meal planning and cooking strategies, and holiday eating strategies. We discussed various medication options to help Megan May with her weight loss efforts and we both agreed to continue phentermine  and topiramate  at current dosages of 8 mg and 50 mg respectively follow-up at next appointment as to whether or not patient has any symptoms or side effects of these medications..  Return in about 3 weeks (around 07/12/2024).  She was informed of the importance of frequent follow up visits to maximize her success with intensive lifestyle modifications for her multiple health conditions.  Attestation Statements:   Reviewed by clinician on day of visit: allergies, medications, problem list, medical history, surgical history, family history, social history, and previous encounter notes.     Megan Cho, MD

## 2024-07-02 NOTE — Assessment & Plan Note (Signed)
 Encouraged increased hydration, 64 ounces of clear fluids daily. Minimize alcohol and caffeine. Eat small frequent meals with lean proteins and complex carbs. Avoid high and low blood sugars. Get adequate sleep, 7-8 hours a night. Needs exercise daily preferably in the morning.

## 2024-07-02 NOTE — Assessment & Plan Note (Signed)
 Supplement and monitor

## 2024-07-02 NOTE — Progress Notes (Unsigned)
 Subjective:    Patient ID: Megan May, female    DOB: 1961-03-14, 63 y.o.   MRN: [id number]  No chief complaint on file.   HPI Discussed the use of AI scribe software for clinical note transcription with the patient, who gave verbal consent to proceed.  History of Present Illness Megan May is a 63 year old female with prediabetes who presents with right lower quadrant abdominal pain.  She has been experiencing right lower quadrant abdominal pain for the past two months, described as a ripping sensation primarily on the right side but occasionally felt on the left side. She reports using Qysmia, a combination of phentermine  and topiramate , which she does not take daily due to irritability caused by phentermine . She is currently on a low dose of 8 mg. No fever, chills, blood in stool, black or sticky stools, or fatty stools.  She reports experiencing a strong, unusual odor, initially thought to be a bacterial infection. An over-the-counter test indicated a high acid level, but subsequent evaluations showed no abnormalities.  She has a history of slight spleen enlargement last year, which was tested for cancer with no significant findings. She also has eosinophilic esophagitis, which is not currently symptomatic. She has a history of diverticulosis and had a colonoscopy two years ago, which showed a couple of polyps, leading to a recommendation for a repeat in five years. No recent trauma, fever, or chills.  She is on vitamin D  supplementation due to low levels. Her last A1c was 5.7, indicating prediabetes, up from 5.4 previously. She is mindful of her diet, focusing on reducing carbohydrates and increasing protein and vegetables.  She is the only overweight person in her family, and her family members are on weight loss medications. She is concerned about the long-term effects of these medications, including bone and muscle loss.  She has not had a flu shot this year  but has received COVID vaccines annually. She is considering the shingles vaccine.    Past Medical History:  Diagnosis Date   Anxiety    Asthma    Chest pain    Depression    history of    Eosinophilic esophagitis    Fatigue 07/02/2017   Generalized anxiety disorder    High blood pressure    Hyperglycemia 03/15/2016   Hyperlipidemia, mixed 06/30/2016   Hypersomnia, recurrent    Hypertension    Joint pain    Lactose intolerance    Migraine    Migraines    NAFLD (nonalcoholic fatty liver disease)    Posttraumatic stress disorder    Shingles 11/03/2021   SOB (shortness of breath)    Swallowing difficulty    Vitamin D  deficiency     Past Surgical History:  Procedure Laterality Date   CESAREAN SECTION  2004   ESOPHAGOGASTRODUODENOSCOPY (EGD) WITH PROPOFOL  N/A 01/12/2019   Procedure: ESOPHAGOGASTRODUODENOSCOPY (EGD) WITH PROPOFOL ;  Surgeon: Rollin Dover, MD;  Location: [facility];  Service: Endoscopy;  Laterality: N/A;   FRACTURE SURGERY  1973   R arm   LAPAROTOMY Bilateral 06/14/2014   Procedure: LAPAROTOMY with right  salpingo-oophorectomy and left salpingectomy;  Surgeon: Duwaine Blumenthal, DO;  Location: [facility];  Service: Gynecology;  Laterality: Bilateral;   TONSILLECTOMY AND ADENOIDECTOMY  1969    Family History  Problem Relation Age of Onset   Cancer Mother    Kidney disease Mother    Hypertension Mother    Obesity Mother    Obesity Father    Cancer Father  Hyperlipidemia Father    Hypertension Father    Heart disease Father    Diabetes Father    CAD Father    Kidney disease Father    Anxiety disorder Father    Diabetes Brother    Stroke Maternal Grandmother    Stroke Maternal Grandfather    Diabetes Paternal Grandmother    Other Cousin        bicuspid aortic valve    Social History   Socioeconomic History   Marital status: Married    Spouse name: Camellia   Number of children: 1   Years of education: 2 Masters   Highest education level: Market researcher (e.g., MA, MS, MEng, MEd, MSW, MBA)  Occupational History   Occupation: SPECIAL EDUCATOR    Employer: [employer]    Comment: Administrator  Tobacco Use   Smoking status: Never   Smokeless tobacco: Never  Substance and Sexual Activity   Alcohol use: No   Drug use: No   Sexual activity: Yes    Birth control/protection: Surgical  Other Topics Concern   Not on file  Social History Narrative   Patient is right handed, resides in home with husband and daughter. She consumes 16 oz of tea daily.   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   Social Drivers of Health   Financial Resource Strain: Medium Risk (01/31/2024)   Overall Financial Resource Strain (CARDIA)    Difficulty of Paying Living Expenses: Somewhat hard  Food Insecurity: No Food Insecurity (01/31/2024)   Hunger Vital Sign    Worried About Running Out of Food in the Last Year: Never true    Ran Out of Food in the Last Year: Never true  Transportation Needs: No Transportation Needs (01/31/2024)   PRAPARE - Administrator, Civil Service (Medical): No    Lack of Transportation (Non-Medical): No  Physical Activity: Insufficiently Active (01/31/2024)   Exercise Vital Sign    Days of Exercise per Week: 2 days    Minutes of Exercise per Session: 20 min  Stress: Stress Concern Present (01/31/2024)   [employer] of Occupational Health - Occupational Stress Questionnaire    Feeling of Stress: To some extent  Social Connections: Moderately Isolated (01/31/2024)   Social Connection and Isolation Panel    Frequency of Communication with Friends and Family: More than three times a week    Frequency of Social Gatherings with Friends and Family: Once a week    Attends Religious Services: Never    Database Administrator or Organizations: No    Attends Engineer, Structural: Not on file    Marital Status: Married  Intimate Partner Violence: Patient Declined (09/24/2023)    Humiliation, Afraid, Rape, and Kick questionnaire    Fear of Current or Ex-Partner: Patient declined    Emotionally Abused: Patient declined    Physically Abused: Patient declined    Sexually Abused: Patient declined    Outpatient Medications Prior to Visit  Medication Sig Dispense Refill   Bromfenac Sodium 0.09 % SOLN Place 1 drop into the right eye 4 (four) times daily.     Fluticasone  Furoate-Vilanterol (BREO ELLIPTA) 50-25 MCG/ACT AEPB Inhale into the lungs.     lisinopril  (ZESTRIL ) 20 MG tablet Take 1 tablet (20 mg total) by mouth daily. 90 tablet 0   meloxicam (MOBIC) 15 MG tablet Take 15 mg by mouth daily.     omeprazole  (PRILOSEC) 20 MG capsule TAKE 1 CAPSULE(20 MG) BY MOUTH  DAILY 30 capsule 3   Phentermine  HCl (LOMAIRA ) 8 MG TABS Take 1 tablet (8 mg total) by mouth daily. 30 tablet 0   spironolactone  (ALDACTONE ) 25 MG tablet Take 25 mg by mouth daily as needed (swelling).     topiramate  (TOPAMAX ) 50 MG tablet Take 1 tablet (50 mg total) by mouth daily. 30 tablet 0   Vitamin D , Ergocalciferol , (DRISDOL ) 1.25 MG (50000 UNIT) CAPS capsule Take 1 capsule (50,000 Units total) by mouth every 7 (seven) days. 8 capsule 0   No facility-administered medications prior to visit.    Allergies  Allergen Reactions   Amoxicillin-Pot Clavulanate Rash   Doxycycline  Other (See Comments)    headache   Levaquin  [Levofloxacin  In D5w] Other (See Comments)    tendonopathy   Clindamycin  Itching   Clindamycin /Lincomycin Itching   Lactose Intolerance (Gi) Nausea Only   Metoprolol      Weight gain/fluid retention    Penicillin G Other (See Comments)   Aspirin  Other (See Comments)    Migraine headache   Latex Rash   Penicillins Swelling and Rash    Did it involve swelling of the face/tongue/throat, SOB, or low BP? No Did it involve sudden or severe rash/hives, skin peeling, or any reaction on the inside of your mouth or nose? Yes Did you need to seek medical attention at a hospital or doctor's  office? No When did it last happen?      63 yo If all above answers are "NO", may proceed with cephalosporin use.    Review of Systems  Constitutional:  Negative for fever and malaise/fatigue.  HENT:  Negative for congestion.   Eyes:  Negative for blurred vision.  Respiratory:  Negative for shortness of breath.   Cardiovascular:  Negative for chest pain, palpitations and leg swelling.  Gastrointestinal:  Positive for abdominal pain. Negative for blood in stool and nausea.  Genitourinary:  Negative for dysuria and frequency.  Musculoskeletal:  Negative for falls.  Skin:  Negative for rash.  Neurological:  Negative for dizziness, loss of consciousness and headaches.  Endo/Heme/Allergies:  Negative for environmental allergies.  Psychiatric/Behavioral:  Negative for depression. The patient is not nervous/anxious.        Objective:    Physical Exam Constitutional:      General: She is not in acute distress.    Appearance: Normal appearance. She is well-developed. She is not ill-appearing or toxic-appearing.  HENT:     Head: Normocephalic and atraumatic.     Right Ear: External ear normal.     Left Ear: External ear normal.     Nose: Nose normal.  Eyes:     General:        Right eye: No discharge.        Left eye: No discharge.     Conjunctiva/sclera: Conjunctivae normal.  Neck:     Thyroid : No thyromegaly.  Cardiovascular:     Rate and Rhythm: Normal rate and regular rhythm.     Heart sounds: Normal heart sounds. No murmur heard. Pulmonary:     Effort: Pulmonary effort is normal. No respiratory distress.     Breath sounds: Normal breath sounds.  Abdominal:     General: Bowel sounds are normal.     Palpations: Abdomen is soft.     Tenderness: There is no abdominal tenderness. There is no guarding.  Musculoskeletal:        General: Normal range of motion.     Cervical back: Neck supple.  Lymphadenopathy:  Cervical: No cervical adenopathy.  Skin:    General: Skin is  warm and dry.     Findings: No rash.  Neurological:     Mental Status: She is alert and oriented to person, place, and time.  Psychiatric:        Mood and Affect: Mood normal.        Behavior: Behavior normal.        Thought Content: Thought content normal.        Judgment: Judgment normal.    LMP 05/10/2014 (Approximate)  Wt Readings from Last 3 Encounters:  06/21/24 179 lb (81.2 kg)  05/24/24 180 lb (81.6 kg)  04/26/24 186 lb (84.4 kg)    Diabetic Foot Exam - Simple   No data filed    Lab Results  Component Value Date   WBC 6.1 09/24/2023   HGB 12.9 09/24/2023   HCT 37.6 09/24/2023   PLT 224 09/24/2023   GLUCOSE 95 03/29/2024   CHOL 211 (H) 03/29/2024   TRIG 67 03/29/2024   HDL 60 03/29/2024   LDLCALC 139 (H) 03/29/2024   ALT 15 03/29/2024   AST 19 03/29/2024   NA 141 03/29/2024   K 4.2 03/29/2024   CL 105 03/29/2024   CREATININE 0.83 03/29/2024   BUN 16 03/29/2024   CO2 21 03/29/2024   TSH 3.010 08/11/2023   HGBA1C 5.7 (H) 03/29/2024    Lab Results  Component Value Date   TSH 3.010 08/11/2023   Lab Results  Component Value Date   WBC 6.1 09/24/2023   HGB 12.9 09/24/2023   HCT 37.6 09/24/2023   MCV 86.0 09/24/2023   PLT 224 09/24/2023   Lab Results  Component Value Date   NA 141 03/29/2024   K 4.2 03/29/2024   CO2 21 03/29/2024   GLUCOSE 95 03/29/2024   BUN 16 03/29/2024   CREATININE 0.83 03/29/2024   BILITOT 0.9 03/29/2024   ALKPHOS 88 03/29/2024   AST 19 03/29/2024   ALT 15 03/29/2024   PROT 6.5 03/29/2024   ALBUMIN 4.5 03/29/2024   CALCIUM 9.7 03/29/2024   ANIONGAP 7 09/24/2023   EGFR 80 03/29/2024   GFR 91.69 07/05/2023   Lab Results  Component Value Date   CHOL 211 (H) 03/29/2024   Lab Results  Component Value Date   HDL 60 03/29/2024   Lab Results  Component Value Date   LDLCALC 139 (H) 03/29/2024   Lab Results  Component Value Date   TRIG 67 03/29/2024   Lab Results  Component Value Date   CHOLHDL 3 02/10/2022    Lab Results  Component Value Date   HGBA1C 5.7 (H) 03/29/2024       Assessment & Plan:  Vitamin D  deficiency Assessment & Plan: Supplement and monitor    Other migraine without status migrainosus, not intractable Assessment & Plan: Encouraged increased hydration, 64 ounces of clear fluids daily. Minimize alcohol and caffeine. Eat small frequent meals with lean proteins and complex carbs. Avoid high and low blood sugars. Get adequate sleep, 7-8 hours a night. Needs exercise daily preferably in the morning.   Hyperlipidemia, mixed Assessment & Plan: Encouraged heart healthy diet, increase exercise, avoid trans fats, consider a krill oil cap daily   Hyperglycemia Assessment & Plan:  minimize simple carbs. Increase exercise as tolerated. Continue current meds   Essential hypertension, benign Assessment & Plan: Mildly elevated. She will monitor weekly BP and if any concerning symptoms.  no changes to meds. Encouraged heart healthy diet such as  the DASH diet and exercise as tolerated.      Assessment and Plan Assessment & Plan Right lower quadrant abdominal pain Intermittent right lower quadrant abdominal pain, possibly related to muscle spasms. Pain described as sharp and spasmodic, with episodes lasting longer than 10-20 seconds. No associated fever, chills, or gastrointestinal symptoms such as blood in stool, mucus, or fatty stools. Differential includes muscle spasm versus other gastrointestinal causes. Recent colonoscopy showed diverticulosis and polyps, with a recommendation for repeat in five years due to polyps. - Take a break from Qysmia for two weeks to assess if pain resolves. - Prescribed hyoscyamine  sublingual tablets for muscle spasms. - If pain persists after stopping Qysmia, will consider imaging to rule out other causes. - Monitor for red flag symptoms such as blood in stool, mucus, fatty stools, or changes in bowel habits.  Obesity Management with  phentermine  and topiramate  (Qysmia). Concerns about long-term effects of weight loss medications, including potential bone and muscle loss. Discussion about microdosing and compounded medications, with caution advised due to lack of regulation. Insurance coverage issues with GLP-1 medications noted. - Continue current regimen of phentermine  and topiramate . - Consider taking a break from Qysmia to assess impact on abdominal pain. - Discussed potential insurance implications of failing Qysmia for future medication coverage.  Prediabetes Recent A1c of 5.7, indicating prediabetes. Emphasis on lifestyle modifications including diet and exercise to manage blood sugar levels. Discussion about the importance of reducing carbohydrate intake and increasing protein and vegetable consumption. - Continue to monitor A1c levels, with a follow-up in three months if needed. - Encouraged dietary modifications to reduce carbohydrate intake and increase protein and vegetables. - Encouraged regular physical activity and stress management.  Vitamin D  deficiency Managed with daily supplementation. Discussion about the benefits of daily dosing versus high-dose weekly dosing, with preference for daily dosing due to better outcomes in studies. - Continue daily vitamin D  supplementation.  General Health Maintenance Discussion about routine health maintenance including vaccinations and screenings. Emphasis on the importance of flu, COVID, and shingles vaccines, especially as she ages. Discussion about the benefits of shingles vaccine in reducing the risk of shingles outbreak and potential dementia risk. - Consider annual flu and COVID vaccines. - Plan for shingles vaccine with a day off work due to potential side effects. - Continue routine screenings as per guidelines, including mammograms every two years and Pap smears every three to five years.  Recording duration: 33 minutes     Harlene Horton, MD

## 2024-07-02 NOTE — Assessment & Plan Note (Signed)
 minimize simple carbs. Increase exercise as tolerated. Continue current meds

## 2024-07-02 NOTE — Assessment & Plan Note (Signed)
 Encouraged heart healthy diet, increase exercise, avoid trans fats, consider a krill oil cap daily

## 2024-07-02 NOTE — Assessment & Plan Note (Signed)
Mildly elevated. She will monitor weekly BP and if any concerning symptoms.  no changes to meds. Encouraged heart healthy diet such as the DASH diet and exercise as tolerated.

## 2024-07-03 ENCOUNTER — Ambulatory Visit: Admitting: Family Medicine

## 2024-07-03 VITALS — BP 122/80 | HR 63 | Resp 16 | Ht 66.0 in | Wt 185.0 lb

## 2024-07-03 DIAGNOSIS — E559 Vitamin D deficiency, unspecified: Secondary | ICD-10-CM | POA: Diagnosis not present

## 2024-07-03 DIAGNOSIS — R739 Hyperglycemia, unspecified: Secondary | ICD-10-CM

## 2024-07-03 DIAGNOSIS — E782 Mixed hyperlipidemia: Secondary | ICD-10-CM

## 2024-07-03 DIAGNOSIS — I10 Essential (primary) hypertension: Secondary | ICD-10-CM

## 2024-07-03 DIAGNOSIS — G43809 Other migraine, not intractable, without status migrainosus: Secondary | ICD-10-CM

## 2024-07-03 DIAGNOSIS — R1031 Right lower quadrant pain: Secondary | ICD-10-CM | POA: Insufficient documentation

## 2024-07-03 MED ORDER — HYOSCYAMINE SULFATE SL 0.125 MG SL SUBL: 1.0000 | SUBLINGUAL_TABLET | Freq: Three times a day (TID) | SUBLINGUAL | 3 refills | Status: AC | PRN

## 2024-07-03 NOTE — Patient Instructions (Addendum)
 All [facility] pharmacies are all walk in vaccine clinics M-F 9-4 Annual COVID and flu vaccinesPreventive Care 39-62 Years Old, Female Preventive care refers to lifestyle choices and visits with your health care provider that can promote health and wellness. Preventive care visits are also called wellness exams. What can I expect for my preventive care visit? Counseling Your health care provider may ask you questions about your: Medical history, including: Past medical problems. Family medical history. Pregnancy history. Current health, including: Menstrual cycle. Method of birth control. Emotional well-being. Home life and relationship well-being. Sexual activity and sexual health. Lifestyle, including: Alcohol, nicotine or tobacco, and drug use. Access to firearms. Diet, exercise, and sleep habits. Work and work astronomer. Sunscreen use. Safety issues such as seatbelt and bike helmet use. Physical exam Your health care provider will check your: Height and weight. These may be used to calculate your BMI (body mass index). BMI is a measurement that tells if you are at a healthy weight. Waist circumference. This measures the distance around your waistline. This measurement also tells if you are at a healthy weight and may help predict your risk of certain diseases, such as type 2 diabetes and high blood pressure. Heart rate and blood pressure. Body temperature. Skin for abnormal spots. What immunizations do I need?  Vaccines are usually given at various ages, according to a schedule. Your health care provider will recommend vaccines for you based on your age, medical history, and lifestyle or other factors, such as travel or where you work. What tests do I need? Screening Your health care provider may recommend screening tests for certain conditions. This may include: Lipid and cholesterol levels. Diabetes screening. This is done by checking your blood sugar (glucose) after  you have not eaten for a while (fasting). Pelvic exam and Pap test. Hepatitis B test. Hepatitis C test. HIV (human immunodeficiency virus) test. STI (sexually transmitted infection) testing, if you are at risk. Lung cancer screening. Colorectal cancer screening. Mammogram. Talk with your health care provider about when you should start having regular mammograms. This may depend on whether you have a family history of breast cancer. BRCA-related cancer screening. This may be done if you have a family history of breast, ovarian, tubal, or peritoneal cancers. Bone density scan. This is done to screen for osteoporosis. Talk with your health care provider about your test results, treatment options, and if necessary, the need for more tests. Follow these instructions at home: Eating and drinking  Eat a diet that includes fresh fruits and vegetables, whole grains, lean protein, and low-fat dairy products. Take vitamin and mineral supplements as recommended by your health care provider. Do not drink alcohol if: Your health care provider tells you not to drink. You are pregnant, may be pregnant, or are planning to become pregnant. If you drink alcohol: Limit how much you have to 0-1 drink a day. Know how much alcohol is in your drink. In the [place] one drink equals one 12 oz bottle of beer (355 mL), one 5 oz glass of wine (148 mL), or one 1 oz glass of hard liquor (44 mL). Lifestyle Brush your teeth every morning and night with fluoride toothpaste. Floss one time each day. Exercise for at least 30 minutes 5 or more days each week. Do not use any products that contain nicotine or tobacco. These products include cigarettes, chewing tobacco, and vaping devices, such as e-cigarettes. If you need help quitting, ask your health care provider. Do not use drugs. If  you are sexually active, practice safe sex. Use a condom or other form of protection to prevent STIs. If you do not wish to become pregnant,  use a form of birth control. If you plan to become pregnant, see your health care provider for a prepregnancy visit. Take aspirin  only as told by your health care provider. Make sure that you understand how much to take and what form to take. Work with your health care provider to find out whether it is safe and beneficial for you to take aspirin  daily. Find healthy ways to manage stress, such as: Meditation, yoga, or listening to music. Journaling. Talking to a trusted person. Spending time with friends and family. Minimize exposure to UV radiation to reduce your risk of skin cancer. Safety Always wear your seat belt while driving or riding in a vehicle. Do not drive: If you have been drinking alcohol. Do not ride with someone who has been drinking. When you are tired or distracted. While texting. If you have been using any mind-altering substances or drugs. Wear a helmet and other protective equipment during sports activities. If you have firearms in your house, make sure you follow all gun safety procedures. Seek help if you have been physically or sexually abused. What's next? Visit your health care provider once a year for an annual wellness visit. Ask your health care provider how often you should have your eyes and teeth checked. Stay up to date on all vaccines. This information is not intended to replace advice given to you by your health care provider. Make sure you discuss any questions you have with your health care provider. Document Revised: 01/15/2021 Document Reviewed: 01/15/2021 Elsevier Patient Education  2024 [employer]  Shingrix is the new shingles shot, 2 shots over 2-6 months, confirm coverage with insurance and document, then can return here for shots with nurse appt or at pharmacy

## 2024-07-05 ENCOUNTER — Encounter: Payer: Self-pay | Admitting: Family Medicine

## 2024-07-19 ENCOUNTER — Ambulatory Visit (INDEPENDENT_AMBULATORY_CARE_PROVIDER_SITE_OTHER): Admitting: Family Medicine

## 2024-07-19 ENCOUNTER — Other Ambulatory Visit (INDEPENDENT_AMBULATORY_CARE_PROVIDER_SITE_OTHER): Payer: Self-pay | Admitting: Family Medicine

## 2024-07-19 ENCOUNTER — Encounter (INDEPENDENT_AMBULATORY_CARE_PROVIDER_SITE_OTHER): Payer: Self-pay | Admitting: Family Medicine

## 2024-07-19 VITALS — BP 140/68 | HR 68 | Temp 98.4°F | Ht 66.0 in | Wt 179.0 lb

## 2024-07-19 DIAGNOSIS — I10 Essential (primary) hypertension: Secondary | ICD-10-CM

## 2024-07-19 DIAGNOSIS — E669 Obesity, unspecified: Secondary | ICD-10-CM | POA: Diagnosis not present

## 2024-07-19 DIAGNOSIS — E559 Vitamin D deficiency, unspecified: Secondary | ICD-10-CM

## 2024-07-19 DIAGNOSIS — Z6828 Body mass index (BMI) 28.0-28.9, adult: Secondary | ICD-10-CM | POA: Diagnosis not present

## 2024-07-19 MED ORDER — VITAMIN D (ERGOCALCIFEROL) 1.25 MG (50000 UNIT) PO CAPS: 50000.0000 [IU] | ORAL_CAPSULE | ORAL | 0 refills | Status: AC

## 2024-07-19 MED ORDER — PHENTERMINE HCL 8 MG PO TABS: 8.0000 mg | ORAL_TABLET | Freq: Every day | ORAL | 0 refills | Status: AC

## 2024-07-19 MED ORDER — TOPIRAMATE 50 MG PO TABS: 50.0000 mg | ORAL_TABLET | Freq: Every day | ORAL | 0 refills | Status: AC

## 2024-07-19 MED ORDER — LISINOPRIL 20 MG PO TABS: 20.0000 mg | ORAL_TABLET | Freq: Every day | ORAL | 0 refills | Status: AC

## 2024-07-19 NOTE — Progress Notes (Signed)
 "  SUBJECTIVE:  Chief Complaint: Obesity  Interim History: Patient here for follow up.  She mentions that she stopped phentermine  and topiramate  due to lower abdominal pain and mentions her pain improved.  She was off the medication for 2 weeks and her carb cravings and hunger came back and so she went back on her medication.  She has been back on the medication for 1 week and denies any recurrence of her pain.  She is wondering about if there are alternatives to her current meds.  Her plan for the next 3-4 weeks is planning to stay consistent with her meal plan and protein and calorie goals.  Thinks some days she is getting in all her protein and some days she is not eating and then binging on carbs.  Planning to [place] few days.   Megan May is here to discuss her progress with her obesity treatment plan. She is on the keeping a food journal and adhering to recommended goals of 1100-1200 calories and 85 grams of protein and states she is following her eating plan approximately 80 % of the time. She states she is walking.  OBJECTIVE: Visit Diagnoses: Problem List Items Addressed This Visit       Other   Vitamin D  deficiency   Relevant Medications   Vitamin D , Ergocalciferol , (DRISDOL ) 1.25 MG (50000 UNIT) CAPS capsule   Other Visit Diagnoses       Primary hypertension    -  Primary   Relevant Medications   lisinopril  (ZESTRIL ) 20 MG tablet     Generalized obesity- Start BMI 30.67       Relevant Medications   Phentermine  HCl (LOMAIRA ) 8 MG TABS   topiramate  (TOPAMAX ) 50 MG tablet       Vitals Temp: 98.4 F (36.9 C) BP: (!) 140/68 Pulse Rate: 68 SpO2: 97 %   Anthropometric Measurements Height: 5' 6 (1.676 m) Weight: 179 lb (81.2 kg) BMI (Calculated): 28.91 Weight at Last Visit: 179 lb Weight Lost Since Last Visit: 0 Weight Gained Since Last Visit: 0 Starting Weight: 190 lb Total Weight Loss (lbs): 11 lb (4.99 kg)   Body Composition  Body Fat %: 39.7  % Fat Mass (lbs): 71.4 lbs Muscle Mass (lbs): 102.8 lbs Total Body Water (lbs): 70.6 lbs Visceral Fat Rating : 10   Other Clinical Data Today's Visit #: 14 Starting Date: 08/11/23 Comments: 1100-1200/85     ASSESSMENT AND PLAN: Assessment & Plan Primary hypertension Blood pressure slightly elevated today.  Patient is still under a good amount of stress.  Needs a refill of lisinopril  today.  No chest pain, chest pressure, or headache reported.  Refill of lisinopril  sent into pharmacy. Vitamin D  deficiency Tolerating prescription strength vitamin D  and needs a refill today.  Will refill at current dosage given that patient denies any nausea vomiting or muscle weakness BMI 28.0-28.9,adult  Generalized obesity- Start BMI 30.67    Diet: Megan May is currently in the action stage of change. As such, her goal is to continue with weight loss efforts and has agreed to keeping a food journal and adhering to recommended goals of 1100-1200 calories and 85 or more grams of protein.   Exercise:  For substantial health benefits, adults should do at least 150 minutes (2 hours and 30 minutes) a week of moderate-intensity, or 75 minutes (1 hour and 15 minutes) a week of vigorous-intensity aerobic physical activity, or an equivalent combination of moderate- and vigorous-intensity aerobic activity. Aerobic activity should  be performed in episodes of at least 10 minutes, and preferably, it should be spread throughout the week.  Behavior Modification:  We discussed the following Behavioral Modification Strategies today: increasing lean protein intake, decreasing simple carbohydrates, no skipping meals, meal planning and cooking strategies, better snacking choices, and keep a strict food journal. We discussed various medication options to help [name] with her weight loss efforts and we both agreed to continue current medications of phentermine  and topiramate  at same dosage. Patient to ponder cost and usage  of GLP 1 medication  Return in about 5 weeks (around 08/23/2024).   She was informed of the importance of frequent follow up visits to maximize her success with intensive lifestyle modifications for her multiple health conditions.  Attestation Statements:   Reviewed by clinician on day of visit: allergies, medications, problem list, medical history, surgical history, family history, social history, and previous encounter notes.   Adelita Cho, MD "

## 2024-07-31 NOTE — Assessment & Plan Note (Signed)
 Tolerating prescription strength vitamin D  and needs a refill today.  Will refill at current dosage given that patient denies any nausea vomiting or muscle weakness

## 2024-08-09 ENCOUNTER — Ambulatory Visit (INDEPENDENT_AMBULATORY_CARE_PROVIDER_SITE_OTHER): Admitting: Family Medicine

## 2024-09-13 ENCOUNTER — Ambulatory Visit (INDEPENDENT_AMBULATORY_CARE_PROVIDER_SITE_OTHER): Admitting: Family Medicine

## 2025-01-08 ENCOUNTER — Ambulatory Visit: Admitting: Family Medicine

## 2025-07-05 ENCOUNTER — Encounter: Admitting: Family Medicine
